# Patient Record
Sex: Male | Born: 1980
Health system: Southern US, Community
[De-identification: ages and names within clinical notes are randomized; demographics above are authoritative.]

## PROBLEM LIST (undated history)

## (undated) DIAGNOSIS — G44209 Tension-type headache, unspecified, not intractable: Secondary | ICD-10-CM

## (undated) DIAGNOSIS — M545 Low back pain, unspecified: Secondary | ICD-10-CM

## (undated) DIAGNOSIS — F419 Anxiety disorder, unspecified: Secondary | ICD-10-CM

## (undated) DIAGNOSIS — E785 Hyperlipidemia, unspecified: Secondary | ICD-10-CM

## (undated) DIAGNOSIS — G4700 Insomnia, unspecified: Secondary | ICD-10-CM

## (undated) DIAGNOSIS — E559 Vitamin D deficiency, unspecified: Secondary | ICD-10-CM

## (undated) HISTORY — DX: Tension-type headache, unspecified, not intractable: G44.209

## (undated) HISTORY — DX: Hyperlipidemia, unspecified: E78.5

## (undated) HISTORY — DX: Anxiety disorder, unspecified: F41.9

## (undated) HISTORY — DX: Low back pain: M54.5

## (undated) HISTORY — DX: Low back pain, unspecified: M54.50

## (undated) HISTORY — DX: Insomnia, unspecified: G47.00

## (undated) HISTORY — DX: Vitamin D deficiency, unspecified: E55.9

---

## 2000-01-07 ENCOUNTER — Encounter: Payer: Self-pay | Admitting: Emergency Medicine

## 2000-01-07 ENCOUNTER — Emergency Department (HOSPITAL_COMMUNITY): Admission: EM | Admit: 2000-01-07 | Discharge: 2000-01-07 | Payer: Self-pay | Admitting: Emergency Medicine

## 2008-03-08 ENCOUNTER — Encounter: Admission: RE | Admit: 2008-03-08 | Discharge: 2008-03-08 | Payer: Self-pay | Admitting: Family Medicine

## 2011-01-23 ENCOUNTER — Other Ambulatory Visit (HOSPITAL_COMMUNITY): Payer: Self-pay | Admitting: Internal Medicine

## 2011-01-23 ENCOUNTER — Ambulatory Visit (HOSPITAL_COMMUNITY)
Admission: RE | Admit: 2011-01-23 | Discharge: 2011-01-23 | Disposition: A | Discharge status: Home / Self Care | Payer: Commercial Managed Care - PPO | Source: Ambulatory Visit | Attending: Internal Medicine | Admitting: Internal Medicine

## 2011-01-23 DIAGNOSIS — M546 Pain in thoracic spine: Secondary | ICD-10-CM

## 2011-01-23 DIAGNOSIS — F17200 Nicotine dependence, unspecified, uncomplicated: Secondary | ICD-10-CM

## 2011-01-23 DIAGNOSIS — R51 Headache: Secondary | ICD-10-CM

## 2011-01-23 DIAGNOSIS — M542 Cervicalgia: Secondary | ICD-10-CM

## 2012-01-22 ENCOUNTER — Other Ambulatory Visit (HOSPITAL_COMMUNITY): Payer: Self-pay | Admitting: Internal Medicine

## 2012-01-22 ENCOUNTER — Ambulatory Visit (HOSPITAL_COMMUNITY)
Admission: RE | Admit: 2012-01-22 | Discharge: 2012-01-22 | Disposition: A | Discharge status: Home / Self Care | Payer: Commercial Managed Care - PPO | Source: Ambulatory Visit | Attending: Internal Medicine | Admitting: Internal Medicine

## 2012-01-22 DIAGNOSIS — M542 Cervicalgia: Secondary | ICD-10-CM | POA: Insufficient documentation

## 2012-01-22 DIAGNOSIS — M79609 Pain in unspecified limb: Secondary | ICD-10-CM | POA: Insufficient documentation

## 2012-01-22 DIAGNOSIS — R51 Headache: Secondary | ICD-10-CM | POA: Insufficient documentation

## 2013-06-03 ENCOUNTER — Encounter: Payer: Self-pay | Admitting: *Deleted

## 2013-06-07 ENCOUNTER — Institutional Professional Consult (permissible substitution): Payer: Commercial Managed Care - PPO | Admitting: Cardiology

## 2013-06-15 ENCOUNTER — Institutional Professional Consult (permissible substitution): Payer: Commercial Managed Care - PPO | Admitting: Cardiology

## 2013-06-23 ENCOUNTER — Encounter: Payer: Self-pay | Admitting: Cardiology

## 2013-06-23 ENCOUNTER — Ambulatory Visit (INDEPENDENT_AMBULATORY_CARE_PROVIDER_SITE_OTHER): Payer: Commercial Managed Care - PPO | Admitting: Cardiology

## 2013-06-23 VITALS — BP 112/75 | HR 78 | Ht 69.0 in | Wt 149.0 lb

## 2013-06-23 DIAGNOSIS — I10 Essential (primary) hypertension: Secondary | ICD-10-CM | POA: Insufficient documentation

## 2013-06-23 DIAGNOSIS — R002 Palpitations: Secondary | ICD-10-CM

## 2013-06-23 DIAGNOSIS — E785 Hyperlipidemia, unspecified: Secondary | ICD-10-CM

## 2013-06-23 NOTE — Patient Instructions (Addendum)
Your physician has recommended that you wear a 48 hour holter monitor. Holter monitors are medical devices that record the heart's electrical activity. Doctors most often use these monitors to diagnose arrhythmias. Arrhythmias are problems with the speed or rhythm of the heartbeat. The monitor is a small, portable device. You can wear one while you do your normal daily activities. This is usually used to diagnose what is causing palpitations/syncope (passing out).   Your physician has requested that you have an exercise tolerance test. For further information please visit [email or web address] Please also follow instruction sheet, as given.   Your physician recommends that you continue on your current medications as directed. Please refer to the Current Medication list given to you today.   Your physician recommends that you schedule a follow-up appointment in: 3 weeks with Dr. Delton See

## 2013-06-23 NOTE — Progress Notes (Signed)
Patient ID: KYSTON GONCE, male   DOB: [birth year], 32 y.o.   MRN: [id number]    Patient Name: Jason Guerrero Date of Encounter: 06/23/2013  Primary Care Provider:  No primary provider on file. Primary Cardiologist:  Tobias Alexander, H  Patient Profile  Palpitations, hypertension, chest tightness  Problem List   Past Medical History  Diagnosis Date  . HLD (hyperlipidemia)   . Anxiety   . Insomnia   . Lumbago   . Tension headache    History reviewed. No pertinent past surgical history.  Allergies  Allergies  Allergen Reactions  . Sulfa Antibiotics    HPI  A 32 year old male with h/o hyperlipidemia controlled by pravastatin, who is ongoing smoker and comes for concerns of hypertension, tachycardia and palpitation. 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 He has noticed at work that he feels palpitations and some chest tightness at the lower sternum that last for few minutes. He also experiences it at home sometimes on his days off. There is no associated syncope, or SOB.  On two occassions when he checked his BP it was elevated. Once, when he was sick and vomiting it was 160, another time he checked it on the way home from work and it was 146.  He drink few beers after work daily. He is also concerned because his uncle has CAD and underwent CABG x4 at age 22.   Home Medications  Prior to Admission medications   Medication Sig Start Date End Date Taking? Authorizing Provider  alprazolam Prudy Feeler) 2 MG tablet Take 2 mg by mouth at bedtime as needed for sleep.   Yes Historical Provider, MD  amitriptyline (ELAVIL) 50 MG tablet Take 50 mg by mouth 3 (three) times daily as needed for sleep (back spasms).   Yes Historical Provider, MD  atenolol (TENORMIN) 50 MG tablet Take 25 mg by mouth daily.  05/26/13  Yes Historical Provider, MD  Cholecalciferol (VITAMIN D-3) 5000 UNITS TABS Take by mouth daily.   Yes Historical Provider, MD    Magnesium 250 MG TABS Take by mouth daily.   Yes Historical Provider, MD  pravastatin (PRAVACHOL) 40 MG tablet Take 20 mg by mouth daily.    Yes Historical Provider, MD    Family History  Family History  Problem Relation Age of Onset  . Hypertension Mother   . Diabetes Mother   . Heart disease Father   . Diabetes Father   . Colon cancer Maternal Grandfather   . Lung cancer Paternal Grandfather   . Breast cancer Maternal Grandmother   . Ovarian cancer Paternal Grandmother   . Hyperlipidemia Father     Social History  History   Social History  . Marital Status: Single    Spouse Name: N/A    Number of Children: N/A  . Years of Education: N/A   Occupational History  . Not on file.   Social History Main Topics  . Smoking status: Current Every Day Smoker    Types: Cigarettes  . Smokeless tobacco: Not on file  . Alcohol Use: Yes  . Drug Use: Not on file  . Sexual Activity: Not on file   Other Topics Concern  . Not on file   Social History Narrative  . No narrative on file     Review of Systems General:  No chills, fever, night sweats or weight changes.  Cardiovascular:  + chest tightness, dyspnea on exertion, edema, orthopnea, +  palpitations, paroxysmal nocturnal dyspnea. Dermatological: No rash, lesions/masses Respiratory: No cough, dyspnea Urologic: No hematuria, dysuria Abdominal:   No nausea, vomiting, diarrhea, bright red blood per rectum, melena, or hematemesis Neurologic:  No visual changes, wkns, changes in mental status. All other systems reviewed and are otherwise negative except as noted above.  Physical Exam  Blood pressure 112/75, pulse 78, height 5\' 9"  (1.753 m), weight 149 lb (67.586 kg).   General: Pleasant, NAD Psych: Normal affect. Neuro: Alert and oriented X 3. Moves all extremities spontaneously. HEENT: Normal  Neck: Supple without bruits or JVD. Lungs:  Resp regular and unlabored, CTA. Heart: RRR no s3, s4, or murmurs. Abdomen: Soft,  non-tender, non-distended, BS + x 4.  Extremities: No clubbing, cyanosis or edema. DP/PT/Radials 2+ and equal bilaterally.  Accessory Clinical Findings  ECG - Normal SR, no ST changes  Assessment & Plan  32 year old male   1. Palpitations with chest tightness - most probably stress related, the patient takes Xanax for anxiety and states its usually when it wears off. We will order 48 hour Holter. Considering his ongoing smoking, positive family history we will order an exercise treadmill stress test to assess his ECG and symptoms during exertion.   2. Hypertension - he was recently started on Atenolol 25 mg QD, his BP is controlled now. He was asked to check his BP once daily until the next visit. There are recent labs on his record, that are all normal including lytes, kidney function, suspicion for dg like pheochrocytoma is very low. The episodes that he described were associated with high level of stress or during sickness.  3. Hyperlipidemia - LDL, HDL normal, high TAG, the patient admits to drinking the night before. He is on Pravastatin currently. We will repeat lipid profile in 1 year.  4. Smoking cessation counseling provided.  Follow up in 3 weeks.  Tobias Alexander, Rexene Edison, MD 06/23/2013, 11:44 AM

## 2013-07-07 ENCOUNTER — Encounter: Payer: Self-pay | Admitting: *Deleted

## 2013-07-07 ENCOUNTER — Encounter (INDEPENDENT_AMBULATORY_CARE_PROVIDER_SITE_OTHER): Payer: Commercial Managed Care - PPO

## 2013-07-07 DIAGNOSIS — R002 Palpitations: Secondary | ICD-10-CM

## 2013-07-07 NOTE — Progress Notes (Signed)
Patient ID: Jason Guerrero, male   DOB: 03-06-1981, 32 y.o.   MRN: [id number] [facility] 48 Hour Holter Monitor applied to patient.

## 2013-07-16 ENCOUNTER — Other Ambulatory Visit: Payer: Self-pay

## 2013-07-16 ENCOUNTER — Telehealth: Payer: Self-pay | Admitting: Cardiology

## 2013-07-16 ENCOUNTER — Encounter: Payer: Self-pay | Admitting: Cardiology

## 2013-07-16 ENCOUNTER — Telehealth: Payer: Self-pay

## 2013-07-16 MED ORDER — ATENOLOL 50 MG PO TABS: 50.0000 mg | ORAL_TABLET | Freq: Every day | ORAL | Status: DC

## 2013-07-16 NOTE — Telephone Encounter (Signed)
**Note De-Identified Jason Guerrero Obfuscation** Pt is advised to take a whole 50 mg tablet of Atenolol daily and that if the increase in Atenolol makes him dizzy to cut dose back to 25 mg daily, he verbalized understanding.

## 2013-07-16 NOTE — Telephone Encounter (Signed)
New problem    Patient calling back requesting diagnosis of Holter monitor.

## 2013-07-16 NOTE — Telephone Encounter (Signed)
Per Dr Delton See, Pt given 48 hr holter monitor results,and recommendation to increase Atenolol to 50 mg daily. Also pt is advised that if he has dizziness with increased dose of Atenolol to decrease back to 25 mg daily, he verbalized understanding. Per pt request RX sent to [facility] on [address].

## 2013-07-16 NOTE — Progress Notes (Signed)
Patient ID: Jason Guerrero, male   DOB: 04-13-1981, 32 y.o.   MRN: [id number]  Holter monitor was reviewed. There is persistent sinus tachycardia starting at 4 pm till 3 am. That's the time when patient is at work and feels symptomatic with palpitations. We will increase his Atenolol to 50 mg po daily. If not tolerated by BP he was advised to decrease it 25 mg daily. Encouraged to start exercising.

## 2013-07-16 NOTE — Telephone Encounter (Signed)
New Problem  Pt request clarification on dosage instructions for Atenolol.. Please assist

## 2013-07-16 NOTE — Telephone Encounter (Signed)
Pt advised, he verbalized understanding. 

## 2013-08-04 ENCOUNTER — Encounter: Payer: Commercial Managed Care - PPO | Admitting: Physician Assistant

## 2013-08-10 ENCOUNTER — Encounter: Payer: Self-pay | Admitting: Cardiology

## 2013-08-10 ENCOUNTER — Ambulatory Visit (INDEPENDENT_AMBULATORY_CARE_PROVIDER_SITE_OTHER): Payer: Commercial Managed Care - PPO | Admitting: Cardiology

## 2013-08-10 VITALS — BP 112/80 | HR 85 | Ht 69.0 in | Wt 150.0 lb

## 2013-08-10 DIAGNOSIS — R002 Palpitations: Secondary | ICD-10-CM

## 2013-08-10 DIAGNOSIS — E785 Hyperlipidemia, unspecified: Secondary | ICD-10-CM

## 2013-08-10 DIAGNOSIS — I10 Essential (primary) hypertension: Secondary | ICD-10-CM

## 2013-08-10 NOTE — Patient Instructions (Signed)
Your physician recommends that you continue on your current medications as directed. Please refer to the Current Medication list given to you today.  Your physician wants you to follow-up in: 6 months. You will receive a reminder letter in the mail two months in advance. If you don't receive a letter, please call our office to schedule the follow-up appointment.  

## 2013-08-10 NOTE — Progress Notes (Signed)
Patient ID: Jason Guerrero, male   DOB: 1981/03/13, 32 y.o.   MRN: [id number]  Patient Name: Jason Guerrero Date of Encounter: 08/10/2013  Primary Care Provider:  No primary provider on file. Primary Cardiologist:  Tobias Alexander, H  Patient Profile  Palpitations, hypertension, chest tightness  Problem List   Past Medical History  Diagnosis Date  . HLD (hyperlipidemia)   . Anxiety   . Insomnia   . Lumbago   . Tension headache    No past surgical history on file.  Allergies  Allergies  Allergen Reactions  . Sulfa Antibiotics    HPI  A 32 year old male with h/o hyperlipidemia controlled by pravastatin, who is ongoing smoker and comes for concerns of hypertension, tachycardia and palpitation. 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 He has noticed at work that he feels palpitations and some chest tightness at the lower sternum that last for few minutes. He also experiences it at home sometimes on his days off. There is no associated syncope, or SOB.  On two occassions when he checked his BP it was elevated. Once, when he was sick and vomiting it was 160, another time he checked it on the way home from work and it was 146.  He drink few beers after work daily. He is also concerned because his uncle has CAD and underwent CABG x4 at age 64.   Home Medications  Prior to Admission medications   Medication Sig Start Date End Date Taking? Authorizing Provider  alprazolam Prudy Feeler) 2 MG tablet Take 2 mg by mouth at bedtime as needed for sleep.   Yes Historical Provider, MD  amitriptyline (ELAVIL) 50 MG tablet Take 50 mg by mouth 3 (three) times daily as needed for sleep (back spasms).   Yes Historical Provider, MD  atenolol (TENORMIN) 50 MG tablet Take 25 mg by mouth daily.  05/26/13  Yes Historical Provider, MD  Cholecalciferol (VITAMIN D-3) 5000 UNITS TABS Take by mouth daily.   Yes Historical Provider, MD  Magnesium 250 MG  TABS Take by mouth daily.   Yes Historical Provider, MD  pravastatin (PRAVACHOL) 40 MG tablet Take 20 mg by mouth daily.    Yes Historical Provider, MD    Family History  Family History  Problem Relation Age of Onset  . Hypertension Mother   . Diabetes Mother   . Heart disease Father   . Diabetes Father   . Colon cancer Maternal Grandfather   . Lung cancer Paternal Grandfather   . Breast cancer Maternal Grandmother   . Ovarian cancer Paternal Grandmother   . Hyperlipidemia Father     Social History  History   Social History  . Marital Status: Single    Spouse Name: N/A    Number of Children: N/A  . Years of Education: N/A   Occupational History  . Not on file.   Social History Main Topics  . Smoking status: Current Every Day Smoker    Types: Cigarettes  . Smokeless tobacco: Not on file  . Alcohol Use: Yes  . Drug Use: Not on file  . Sexual Activity: Not on file   Other Topics Concern  . Not on file   Social History Narrative  . No narrative on file     Review of Systems General:  No chills, fever, night sweats or weight changes.  Cardiovascular:  + chest tightness, dyspnea on exertion, edema, orthopnea, + palpitations, paroxysmal  nocturnal dyspnea. Dermatological: No rash, lesions/masses Respiratory: No cough, dyspnea Urologic: No hematuria, dysuria Abdominal:   No nausea, vomiting, diarrhea, bright red blood per rectum, melena, or hematemesis Neurologic:  No visual changes, wkns, changes in mental status. All other systems reviewed and are otherwise negative except as noted above.  Physical Exam  BP 112/80, HR 85  General: Pleasant, NAD Psych: Normal affect. Neuro: Alert and oriented X 3. Moves all extremities spontaneously. HEENT: Normal  Neck: Supple without bruits or JVD. Lungs:  Resp regular and unlabored, CTA. Heart: RRR no s3, s4, or murmurs. Abdomen: Soft, non-tender, non-distended, BS + x 4.  Extremities: No clubbing, cyanosis or edema.  DP/PT/Radials 2+ and equal bilaterally.  Accessory Clinical Findings  ECG - Normal SR, no ST changes    Assessment & Plan  32 year old male   1. Palpitations with chest tightness - stress related, the patient takes Xanax for anxiety and states its usually when it wears off. 48 hour Holter showed Holter monitor showed persistent sinus tachycardia starting at 4 pm till 3 am, exactly when the patient is at work. No other arrhythmias. We increased Atenolol to 50 mg po daily 2 weeks ago. He still reports palpitations.  An exercise treadmill stress test to assess his ECG and symptoms during exertion as well as BP response during exercise is scheduled for the next week (the patient rescheduled).   2. Hypertension - on Atenolol 50 mg QD, his BP is controlled now.   3. Hyperlipidemia - LDL, HDL normal, high TAG, the patient admits to drinking the night before. He is on Pravastatin currently. We will repeat lipid profile in 1 year.  4. Smoking cessation counseling provided.  The patient was advised not to use Xanax and alcohol at the same time and also about dependence after a chronic Xanax intake. He states that he is unable to sleep without anything else.  Follow up in 1 year. We will call the patient with rthe results of the stress test, and see him earlier if necessary.  Tobias Alexander, Rexene Edison, MD 08/10/2013, 1:57 PM

## 2013-08-11 ENCOUNTER — Other Ambulatory Visit: Payer: Self-pay | Admitting: Internal Medicine

## 2013-08-11 ENCOUNTER — Other Ambulatory Visit: Payer: Self-pay | Admitting: Emergency Medicine

## 2013-08-11 MED ORDER — ALPRAZOLAM 2 MG PO TABS: 2.0000 mg | ORAL_TABLET | Freq: Every evening | ORAL | Status: DC | PRN

## 2013-09-08 ENCOUNTER — Encounter: Payer: Commercial Managed Care - PPO | Admitting: Physician Assistant

## 2013-09-09 ENCOUNTER — Other Ambulatory Visit: Payer: Self-pay | Admitting: Emergency Medicine

## 2013-09-09 MED ORDER — ALPRAZOLAM 2 MG PO TABS: 2.0000 mg | ORAL_TABLET | Freq: Every evening | ORAL | Status: DC | PRN

## 2013-09-20 ENCOUNTER — Ambulatory Visit (INDEPENDENT_AMBULATORY_CARE_PROVIDER_SITE_OTHER): Payer: Commercial Managed Care - PPO | Admitting: Physician Assistant

## 2013-09-20 ENCOUNTER — Encounter: Payer: Commercial Managed Care - PPO | Admitting: Physician Assistant

## 2013-09-20 DIAGNOSIS — R0789 Other chest pain: Secondary | ICD-10-CM

## 2013-09-20 DIAGNOSIS — R002 Palpitations: Secondary | ICD-10-CM

## 2013-09-20 NOTE — Progress Notes (Signed)
Exercise Treadmill Test  Pre-Exercise Testing Evaluation Rhythm: normal sinus  Rate: 87     Test  Exercise Tolerance Test Ordering MD: Tobias Alexander  Interpreting MD: Tereso Newcomer, PA-C  Unique Test No: 1  Treadmill:  1  Indication for ETT: chest tightness, palpitations  Contraindication to ETT: No   Stress Modality: exercise - treadmill  Cardiac Imaging Performed: non   Protocol: standard Bruce - maximal  Max BP:  149/81  Max MPHR (bpm):  188 85% MPR (bpm):  160  MPHR obtained (bpm):  166 % MPHR obtained:  88  Reached 85% MPHR (min:sec):  14:00 Total Exercise Time (min-sec):  14:30  Workload in METS:  17.2 Borg Scale: 18  Reason ETT Terminated:  desired heart rate attained    ST Segment Analysis At Rest: normal ST segments - no evidence of significant ST depression With Exercise: no evidence of significant ST depression  Other Information Arrhythmia:  No Angina during ETT:  present (1) Quality of ETT:  diagnostic  ETT Interpretation:  normal - no evidence of ischemia by ST analysis  Comments: Excellent exercise capacity. Patient did complain of chest pain (tightness) throughout. Normal BP response to exercise. No ST changes to suggest ischemia.  No atrial or ventricular arrhythmias.  Recommendations: F/u with Dr. Tobias Alexander as directed. Signed,  Tereso Newcomer, PA-C   09/20/2013 3:05 PM

## 2013-10-07 ENCOUNTER — Other Ambulatory Visit: Payer: Self-pay | Admitting: Emergency Medicine

## 2013-10-08 ENCOUNTER — Other Ambulatory Visit: Payer: Self-pay | Admitting: Emergency Medicine

## 2013-10-08 MED ORDER — ALPRAZOLAM 2 MG PO TABS: 2.0000 mg | ORAL_TABLET | Freq: Every evening | ORAL | Status: DC | PRN

## 2013-10-11 ENCOUNTER — Encounter: Payer: Self-pay | Admitting: Emergency Medicine

## 2013-10-11 DIAGNOSIS — E559 Vitamin D deficiency, unspecified: Secondary | ICD-10-CM | POA: Insufficient documentation

## 2013-10-15 ENCOUNTER — Ambulatory Visit: Payer: Self-pay | Admitting: Emergency Medicine

## 2013-10-21 ENCOUNTER — Ambulatory Visit (INDEPENDENT_AMBULATORY_CARE_PROVIDER_SITE_OTHER): Payer: Commercial Managed Care - PPO | Admitting: Emergency Medicine

## 2013-10-21 ENCOUNTER — Encounter: Payer: Self-pay | Admitting: Emergency Medicine

## 2013-10-21 VITALS — BP 144/90 | HR 100 | Temp 98.6°F | Resp 18 | Ht 69.25 in | Wt 148.0 lb

## 2013-10-21 DIAGNOSIS — E782 Mixed hyperlipidemia: Secondary | ICD-10-CM

## 2013-10-21 DIAGNOSIS — Z23 Encounter for immunization: Secondary | ICD-10-CM

## 2013-10-21 DIAGNOSIS — S61218A Laceration without foreign body of other finger without damage to nail, initial encounter: Secondary | ICD-10-CM

## 2013-10-21 DIAGNOSIS — G4700 Insomnia, unspecified: Secondary | ICD-10-CM

## 2013-10-21 DIAGNOSIS — S61209A Unspecified open wound of unspecified finger without damage to nail, initial encounter: Secondary | ICD-10-CM

## 2013-10-21 DIAGNOSIS — I10 Essential (primary) hypertension: Secondary | ICD-10-CM

## 2013-10-21 LAB — CBC WITH DIFFERENTIAL/PLATELET
Basophils Absolute: 0 10*3/uL (ref 0.0–0.1)
Basophils Relative: 1 % (ref 0–1)
EOS ABS: 0.3 10*3/uL (ref 0.0–0.7)
EOS PCT: 6 % — AB (ref 0–5)
HCT: 45.2 % (ref 39.0–52.0)
HEMOGLOBIN: 15.9 g/dL (ref 13.0–17.0)
LYMPHS ABS: 1.8 10*3/uL (ref 0.7–4.0)
LYMPHS PCT: 34 % (ref 12–46)
MCH: 31.1 pg (ref 26.0–34.0)
MCHC: 35.2 g/dL (ref 30.0–36.0)
MCV: 88.3 fL (ref 78.0–100.0)
MONOS PCT: 9 % (ref 3–12)
Monocytes Absolute: 0.5 10*3/uL (ref 0.1–1.0)
Neutro Abs: 2.8 10*3/uL (ref 1.7–7.7)
Neutrophils Relative %: 50 % (ref 43–77)
Platelets: 185 10*3/uL (ref 150–400)
RBC: 5.12 MIL/uL (ref 4.22–5.81)
RDW: 13.5 % (ref 11.5–15.5)
WBC: 5.4 10*3/uL (ref 4.0–10.5)

## 2013-10-21 LAB — HEPATIC FUNCTION PANEL
ALBUMIN: 4.7 g/dL (ref 3.5–5.2)
ALT: 16 U/L (ref 0–53)
AST: 18 U/L (ref 0–37)
Alkaline Phosphatase: 53 U/L (ref 39–117)
BILIRUBIN TOTAL: 0.7 mg/dL (ref 0.3–1.2)
Bilirubin, Direct: 0.1 mg/dL (ref 0.0–0.3)
Indirect Bilirubin: 0.6 mg/dL (ref 0.0–0.9)
Total Protein: 7.3 g/dL (ref 6.0–8.3)

## 2013-10-21 LAB — BASIC METABOLIC PANEL WITH GFR
BUN: 14 mg/dL (ref 6–23)
CALCIUM: 9.6 mg/dL (ref 8.4–10.5)
CO2: 27 mEq/L (ref 19–32)
Chloride: 104 mEq/L (ref 96–112)
Creat: 0.94 mg/dL (ref 0.50–1.35)
GFR, Est African American: >89 mL/min
GFR, Est Non African American: >89 mL/min
GLUCOSE: 93 mg/dL (ref 70–99)
Potassium: 4 mEq/L (ref 3.5–5.3)
SODIUM: 139 meq/L (ref 135–145)

## 2013-10-21 LAB — LIPID PANEL
CHOLESTEROL: 148 mg/dL (ref 0–200)
HDL: 53 mg/dL (ref >39)
LDL Cholesterol: 54 mg/dL (ref 0–99)
Total CHOL/HDL Ratio: 2.8 Ratio
Triglycerides: 204 mg/dL — ABNORMAL HIGH (ref <150)
VLDL: 41 mg/dL — ABNORMAL HIGH (ref 0–40)

## 2013-10-21 MED ORDER — ALPRAZOLAM 2 MG PO TABS: 2.0000 mg | ORAL_TABLET | Freq: Every evening | ORAL | Status: DC | PRN

## 2013-10-21 MED ORDER — LEVOFLOXACIN 500 MG PO TABS: 500.0000 mg | ORAL_TABLET | Freq: Every day | ORAL | Status: AC

## 2013-10-21 NOTE — Patient Instructions (Signed)
Tetanus, Diphtheria, Pertussis (Tdap) Vaccine What You Need to Know WHY GET VACCINATED? Tetanus, diphtheria and pertussis can be very serious diseases, even for adolescents and adults. Tdap vaccine can protect us from these diseases. TETANUS (Lockjaw) causes painful muscle tightening and stiffness, usually all over the body.  It can lead to tightening of muscles in the head and neck so you can't open your mouth, swallow, or sometimes even breathe. Tetanus kills about 1 out of 5 people who are infected. DIPHTHERIA can cause a thick coating to form in the back of the throat.  It can lead to breathing problems, paralysis, heart failure, and death. PERTUSSIS (Whooping Cough) causes severe coughing spells, which can cause difficulty breathing, vomiting and disturbed sleep.  It can also lead to weight loss, incontinence, and rib fractures. Up to 2 in 100 adolescents and 5 in 100 adults with pertussis are hospitalized or have complications, which could include pneumonia and death. These diseases are caused by bacteria. Diphtheria and pertussis are spread from person to person through coughing or sneezing. Tetanus enters the body through cuts, scratches, or wounds. Before vaccines, the [place] saw as many as 200,000 cases a year of diphtheria and pertussis, and hundreds of cases of tetanus. Since vaccination began, tetanus and diphtheria have dropped by about 99% and pertussis by about 80%. TDAP VACCINE Tdap vaccine can protect adolescents and adults from tetanus, diphtheria, and pertussis. One dose of Tdap is routinely given at age 11 or 12. People who did not get Tdap at that age should get it as soon as possible. Tdap is especially important for health care professionals and anyone having close contact with a baby younger than 12 months. Pregnant women should get a dose of Tdap during every pregnancy, to protect the newborn from pertussis. Infants are most at risk for severe, life-threatening  complications from pertussis. A similar vaccine, called Td, protects from tetanus and diphtheria, but not pertussis. A Td booster should be given every 10 years. Tdap may be given as one of these boosters if you have not already gotten a dose. Tdap may also be given after a severe cut or burn to prevent tetanus infection. Your doctor can give you more information. Tdap may safely be given at the same time as other vaccines. SOME PEOPLE SHOULD NOT GET THIS VACCINE  If you ever had a life-threatening allergic reaction after a dose of any tetanus, diphtheria, or pertussis containing vaccine, OR if you have a severe allergy to any part of this vaccine, you should not get Tdap. Tell your doctor if you have any severe allergies.  If you had a coma, or long or multiple seizures within 7 days after a childhood dose of DTP or DTaP, you should not get Tdap, unless a cause other than the vaccine was found. You can still get Td.  Talk to your doctor if you:  have epilepsy or another nervous system problem,  had severe pain or swelling after any vaccine containing diphtheria, tetanus or pertussis,  ever had Guillain-Barr Syndrome (GBS),  aren't feeling well on the day the shot is scheduled. RISKS OF A VACCINE REACTION With any medicine, including vaccines, there is a chance of side effects. These are usually mild and go away on their own, but serious reactions are also possible. Brief fainting spells can follow a vaccination, leading to injuries from falling. Sitting or lying down for about 15 minutes can help prevent these. Tell your doctor if you feel dizzy or light-headed, or   have vision changes or ringing in the ears. Mild problems following Tdap (Did not interfere with activities)  Pain where the shot was given (about 3 in 4 adolescents or 2 in 3 adults)  Redness or swelling where the shot was given (about 1 person in 5)  Mild fever of at least 100.9F (up to about 1 in 25 adolescents or 1 in  100 adults)  Headache (about 3 or 4 people in 10)  Tiredness (about 1 person in 3 or 4)  Nausea, vomiting, diarrhea, stomach ache (up to 1 in 4 adolescents or 1 in 10 adults)  Chills, body aches, sore joints, rash, swollen glands (uncommon) Moderate problems following Tdap (Interfered with activities, but did not require medical attention)  Pain where the shot was given (about 1 in 5 adolescents or 1 in 100 adults)  Redness or swelling where the shot was given (up to about 1 in 16 adolescents or 1 in 25 adults)  Fever over 102F (about 1 in 100 adolescents or 1 in 250 adults)  Headache (about 3 in 20 adolescents or 1 in 10 adults)  Nausea, vomiting, diarrhea, stomach ache (up to 1 or 3 people in 100)  Swelling of the entire arm where the shot was given (up to about 3 in 100). Severe problems following Tdap (Unable to perform usual activities, required medical attention)  Swelling, severe pain, bleeding and redness in the arm where the shot was given (rare). A severe allergic reaction could occur after any vaccine (estimated less than 1 in a million doses). WHAT IF THERE IS A SERIOUS REACTION? What should I look for?  Look for anything that concerns you, such as signs of a severe allergic reaction, very high fever, or behavior changes. Signs of a severe allergic reaction can include hives, swelling of the face and throat, difficulty breathing, a fast heartbeat, dizziness, and weakness. These would start a few minutes to a few hours after the vaccination. What should I do?  If you think it is a severe allergic reaction or other emergency that can't wait, call 9-1-1 or get the person to the nearest hospital. Otherwise, call your doctor.  Afterward, the reaction should be reported to the "Vaccine Adverse Event Reporting System" (VAERS). Your doctor might file this report, or you can do it yourself through the VAERS web site at www.vaers.LAgents.no, or by calling 1-913-553-5628. VAERS is  only for reporting reactions. They do not give medical advice.  THE NATIONAL VACCINE INJURY COMPENSATION PROGRAM The National Vaccine Injury Compensation Program (VICP) is a federal program that was created to compensate people who may have been injured by certain vaccines. Persons who believe they may have been injured by a vaccine can learn about the program and about filing a claim by calling 1-516-370-4507 or visiting the VICP website at SpiritualWord.at. HOW CAN I LEARN MORE?  Ask your doctor.  Call your local or state health department.  Contact the Centers for Disease Control and Prevention (CDC):  Call 801-694-1607 or visit CDC's website at PicCapture.uy. CDC Tdap Vaccine VIS (02/06/12) Document Released: 03/17/2012 Document Revised: 01/11/2013 Document Reviewed: 01/06/2013 [facility] Patient Information 2014 [place], [place]. Laceration Care, Adult A laceration is a cut that goes through all layers of the skin. The cut goes into the tissue beneath the skin. HOME CARE For stitches (sutures) or staples:  Keep the cut clean and dry.  If you have a bandage (dressing), change it at least once a day. Change the bandage if it gets wet or  dirty, or as told by your doctor.  Wash the cut with soap and water 2 times a day. Rinse the cut with water. Pat it dry with a clean towel.  Put a thin layer of medicated cream on the cut as told by your doctor.  You may shower after the first 24 hours. Do not soak the cut in water until the stitches are removed.  Only take medicines as told by your doctor.  Have your stitches or staples removed as told by your doctor. For skin adhesive strips:  Keep the cut clean and dry.  Do not get the strips wet. You may take a bath, but be careful to keep the cut dry.  If the cut gets wet, pat it dry with a clean towel.  The strips will fall off on their own. Do not remove the strips that are still stuck to the cut. For wound  glue:  You may shower or take baths. Do not soak or scrub the cut. Do not swim. Avoid heavy sweating until the glue falls off on its own. After a shower or bath, pat the cut dry with a clean towel.  Do not put medicine on your cut until the glue falls off.  If you have a bandage, do not put tape over the glue.  Avoid lots of sunlight or tanning lamps until the glue falls off. Put sunscreen on the cut for the first year to reduce your scar.  The glue will fall off on its own. Do not pick at the glue. You may need a tetanus shot if:  You cannot remember when you had your last tetanus shot.  You have never had a tetanus shot. If you need a tetanus shot and you choose not to have one, you may get tetanus. Sickness from tetanus can be serious. GET HELP RIGHT AWAY IF:   Your pain does not get better with medicine.  Your arm, hand, leg, or foot loses feeling (numbness) or changes color.  Your cut is bleeding.  Your joint feels weak, or you cannot use your joint.  You have painful lumps on your body.  Your cut is red, puffy (swollen), or painful.  You have a red line on the skin near the cut.  You have yellowish-white fluid (pus) coming from the cut.  You have a fever.  You have a bad smell coming from the cut or bandage.  Your cut breaks open before or after stitches are removed.  You notice something coming out of the cut, such as wood or glass.  You cannot move a finger or toe. MAKE SURE YOU:   Understand these instructions.  Will watch your condition.  Will get help right away if you are not doing well or get worse. Document Released: 03/04/2008 Document Revised: 12/09/2011 Document Reviewed: 03/12/2011 [facility] Patient Information 2014 [place], [place].

## 2013-10-21 NOTE — Progress Notes (Signed)
Subjective:    Patient ID: Jason Guerrero, male    DOB: Oct 01, 1980, 33 y.o.   MRN: [id number]  HPI Comments: 33 yo male presents for 3 month F/U for HTN, Cholesterol, D. deficient LAST LABS T 160 TG 307 L 38 H 61  MAG 2.1 INSULIN 6 D 83 HE notes BP has been good. He did not get into bed until 430 am so he is a little tired because he has not slept. He notes back/ arm pain/ BP always worse with lack of sleep.  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 He is not eating as healthy or exercising outside of work.  Cut finger with a knife last night at work, cleaned immediately and put antibacterial on wound. He notes mild decreased sensation at tip of finger.   Hypertension Associated symptoms include anxiety.  Hyperlipidemia  Anxiety      Current Outpatient Prescriptions on File Prior to Visit  Medication Sig Dispense Refill  . alprazolam (XANAX) 2 MG tablet Take 1 tablet (2 mg total) by mouth at bedtime as needed for sleep.  10 tablet  0  . amitriptyline (ELAVIL) 50 MG tablet TAKE 1 TABLET 3 TIMES A DAY FOR CHRONIC NECK PAIN  270 tablet  2  . atenolol (TENORMIN) 50 MG tablet Take 1 tablet (50 mg total) by mouth daily.  30 tablet  6  . Cholecalciferol (VITAMIN D-3) 5000 UNITS TABS Take by mouth daily.      . Magnesium 250 MG TABS Take by mouth daily.      . pravastatin (PRAVACHOL) 40 MG tablet Take 20 mg by mouth daily.        No current facility-administered medications on file prior to visit.      Review of Systems  Musculoskeletal: Positive for arthralgias.  Skin: Positive for wound.  Psychiatric/Behavioral: Positive for sleep disturbance.  All other systems reviewed and are negative.   BP 144/90  Pulse 100  Temp(Src) 98.6 F (37 C) (Temporal)  Resp 18  Ht 5' 9.25" (1.759 m)  Wt 148 lb (67.132 kg)  BMI 21.70 kg/m2     Objective:   Physical Exam  Nursing note and vitals reviewed. Constitutional: He is  oriented to person, place, and time. He appears well-developed and well-nourished.  HENT:  Head: Normocephalic and atraumatic.  Right Ear: External ear normal.  Left Ear: External ear normal.  Nose: Nose normal.  Eyes: Conjunctivae and EOM are normal.  Neck: Normal range of motion. Neck supple. No JVD present. No thyromegaly present.  Cardiovascular: Normal rate, regular rhythm, normal heart sounds and intact distal pulses.   Pulmonary/Chest: Effort normal and breath sounds normal.  Abdominal: Soft. Bowel sounds are normal. He exhibits no distension and no mass. There is no tenderness. There is no rebound and no guarding.  Musculoskeletal: Normal range of motion. He exhibits no edema and no tenderness.       Hands: Lymphadenopathy:    He has no cervical adenopathy.  Neurological: He is alert and oriented to person, place, and time. He has normal reflexes. A cranial nerve deficit is present. Coordination normal.  Decreased tip of right index finger  Skin: Skin is warm and dry.  Psychiatric: He has a normal mood and affect. His behavior is normal. Judgment and thought content normal.          Assessment & Plan:  1.  3 month F/U for  HTN, Cholesterol,  D. Deficient. Needs healthy diet, cardio QD and obtain healthy weight. Check Labs, Check BP if >130/80 call office Increase Atenolol to 100mg  = 2 of the 50 mg 2. Finger laceration- REF Dr Merlyn LotKuzma- Wound cleaned with soak of betadine/ alcohol. Area approximated with steri strip x 1 to decrease movement of wound but allowing for drainage per Ortho. Neosporin/ guaze/ paper tape applied. Wound hygiene explained

## 2013-10-25 NOTE — Progress Notes (Signed)
LMOM TO CALL TO SCHEDULE 3 MTH

## 2013-10-28 ENCOUNTER — Encounter: Payer: Self-pay | Admitting: Emergency Medicine

## 2013-10-29 ENCOUNTER — Encounter: Payer: Commercial Managed Care - PPO | Admitting: Nurse Practitioner

## 2013-11-22 ENCOUNTER — Other Ambulatory Visit: Payer: Self-pay | Admitting: Emergency Medicine

## 2013-11-22 ENCOUNTER — Encounter: Payer: Self-pay | Admitting: Emergency Medicine

## 2013-11-22 MED ORDER — ATENOLOL 100 MG PO TABS
100.0000 mg | ORAL_TABLET | Freq: Every day | ORAL | Status: DC
Start: 2013-11-22 — End: 2014-05-17

## 2013-12-15 ENCOUNTER — Other Ambulatory Visit: Payer: Self-pay | Admitting: Emergency Medicine

## 2013-12-16 ENCOUNTER — Encounter: Payer: Self-pay | Admitting: Emergency Medicine

## 2013-12-16 ENCOUNTER — Other Ambulatory Visit: Payer: Self-pay | Admitting: Emergency Medicine

## 2013-12-17 ENCOUNTER — Other Ambulatory Visit: Payer: Self-pay | Admitting: Emergency Medicine

## 2013-12-22 ENCOUNTER — Other Ambulatory Visit: Payer: Self-pay | Admitting: Emergency Medicine

## 2014-01-20 ENCOUNTER — Ambulatory Visit (INDEPENDENT_AMBULATORY_CARE_PROVIDER_SITE_OTHER): Payer: Commercial Managed Care - PPO | Admitting: Emergency Medicine

## 2014-01-20 ENCOUNTER — Other Ambulatory Visit: Payer: Self-pay | Admitting: Emergency Medicine

## 2014-01-20 ENCOUNTER — Encounter: Payer: Self-pay | Admitting: Emergency Medicine

## 2014-01-20 VITALS — BP 122/84 | HR 88 | Temp 98.2°F | Resp 16 | Ht 69.25 in | Wt 142.0 lb

## 2014-01-20 DIAGNOSIS — E781 Pure hyperglyceridemia: Secondary | ICD-10-CM

## 2014-01-20 DIAGNOSIS — R51 Headache: Secondary | ICD-10-CM

## 2014-01-20 LAB — LIPID PANEL
Cholesterol: 167 mg/dL (ref 0–200)
HDL: 63 mg/dL (ref >39)
LDL Cholesterol: 62 mg/dL (ref 0–99)
Total CHOL/HDL Ratio: 2.7 Ratio
Triglycerides: 210 mg/dL — ABNORMAL HIGH (ref <150)
VLDL: 42 mg/dL — ABNORMAL HIGH (ref 0–40)

## 2014-01-20 LAB — CBC WITH DIFFERENTIAL/PLATELET
Basophils Absolute: 0.1 10*3/uL (ref 0.0–0.1)
Basophils Relative: 1 % (ref 0–1)
EOS ABS: 0.3 10*3/uL (ref 0.0–0.7)
Eosinophils Relative: 5 % (ref 0–5)
HCT: 48.2 % (ref 39.0–52.0)
HEMOGLOBIN: 16.9 g/dL (ref 13.0–17.0)
LYMPHS ABS: 2.1 10*3/uL (ref 0.7–4.0)
LYMPHS PCT: 35 % (ref 12–46)
MCH: 31.4 pg (ref 26.0–34.0)
MCHC: 35.1 g/dL (ref 30.0–36.0)
MCV: 89.6 fL (ref 78.0–100.0)
MONOS PCT: 8 % (ref 3–12)
Monocytes Absolute: 0.5 10*3/uL (ref 0.1–1.0)
NEUTROS ABS: 3.1 10*3/uL (ref 1.7–7.7)
Neutrophils Relative %: 51 % (ref 43–77)
PLATELETS: 196 10*3/uL (ref 150–400)
RBC: 5.38 MIL/uL (ref 4.22–5.81)
RDW: 13.6 % (ref 11.5–15.5)
WBC: 6 10*3/uL (ref 4.0–10.5)

## 2014-01-20 LAB — HEPATIC FUNCTION PANEL
ALBUMIN: 4.7 g/dL (ref 3.5–5.2)
ALK PHOS: 53 U/L (ref 39–117)
ALT: 14 U/L (ref 0–53)
AST: 18 U/L (ref 0–37)
BILIRUBIN INDIRECT: 0.6 mg/dL (ref 0.2–1.2)
BILIRUBIN TOTAL: 0.8 mg/dL (ref 0.2–1.2)
Bilirubin, Direct: 0.2 mg/dL (ref 0.0–0.3)
Total Protein: 7.3 g/dL (ref 6.0–8.3)

## 2014-01-20 MED ORDER — ALPRAZOLAM 2 MG PO TABS: ORAL_TABLET | ORAL | Status: DC

## 2014-01-20 NOTE — Patient Instructions (Signed)
Tension Headache A tension headache is pain, pressure, or aching felt over the front and sides of the head. Tension headaches often come after stress, feeling worried (anxiety), or feeling sad or down for a while (depressed). HOME CARE  Only take medicine as told by your doctor.  Lie down in a dark, quiet room when you have a headache.  Keep a journal to find out if certain things bring on headaches. For example, write down:  What you eat and drink.  How much sleep you get.  Any change to your diet or medicines.  Relax by getting a massage or doing other relaxing activities.  Put ice or heat packs on the head and neck area as told by your doctor.  Lessen stress.  Sit up straight. Do not tighten (tense) your muscles.  Quit smoking if you smoke.  Lessen how much alcohol you drink.  Lessen how much caffeine you drink, or stop drinking caffeine.  Eat and exercise regularly.  Get enough sleep.  Avoid using too much pain medicine. GET HELP RIGHT AWAY IF:   Your headache becomes really bad.  You have a fever.  You have a stiff neck.  You have trouble seeing.  Your muscles are weak, or you lose muscle control.  You lose your balance or have trouble walking.  You feel like you will pass out (faint), or you pass out.  You have really bad symptoms that are different than your first symptoms.  You have problems with the medicines given to you by your doctor.  Your medicines do not work.  Your headache feels different than the other headaches.  You feel sick to your stomach (nauseous) or throw up (vomit). MAKE SURE YOU:   Understand these instructions.  Will watch your condition.  Will get help right away if you are not doing well or get worse. Document Released: 12/11/2009 Document Revised: 12/09/2011 Document Reviewed: 09/06/2011 [facility] Patient Information 2014 [place], [place]. Temporomandibular Problems  Temporomandibular joint (TMJ) dysfunction means  there are problems with the joint between your jaw and your skull. This is a joint lined by cartilage like other joints in your body but also has a small disc in the joint which keeps the bones from rubbing on each other. These joints are like other joints and can get inflamed (sore) from arthritis and other problems. When this joint gets sore, it can cause headaches and pain in the jaw and the face. CAUSES  Usually the arthritic types of problems are caused by soreness in the joint. Soreness in the joint can also be caused by overuse. This may come from grinding your teeth. It may also come from mis-alignment in the joint. DIAGNOSIS Diagnosis of this condition can often be made by history and exam. Sometimes your caregiver may need X-rays or an MRI scan to determine the exact cause. It may be necessary to see your dentist to determine if your teeth and jaws are lined up correctly. TREATMENT  Most of the time this problem is not serious; however, sometimes it can persist (become chronic). When this happens medications that will cut down on inflammation (soreness) help. Sometimes a shot of cortisone into the joint will be helpful. If your teeth are not aligned it may help for your dentist to make a splint for your mouth that can help this problem. If no physical problems can be found, the problem may come from tension. If tension is found to be the cause, biofeedback or relaxation techniques may be  helpful. HOME CARE INSTRUCTIONS   Later in the day, applications of ice packs may be helpful. Ice can be used in a plastic bag with a towel around it to prevent frostbite to skin. This may be used about every 2 hours for 20 to 30 minutes, as needed while awake, or as directed by your caregiver.  Only take over-the-counter or prescription medicines for pain, discomfort, or fever as directed by your caregiver.  If physical therapy was prescribed, follow your caregiver's directions.  Wear mouth appliances as  directed if they were given. Document Released: 06/11/2001 Document Revised: 12/09/2011 Document Reviewed: 09/18/2008 [facility] Patient Information 2014 [place], [place].

## 2014-01-20 NOTE — Progress Notes (Signed)
Subjective:    Patient ID: Jason Guerrero, male    DOB: [birth year], 33 y.o.   MRN: [id number]  HPI Comments: 33 yo WM for cholesterol and anxiety F/U. He added FO at last OV. He is eating healthy. He is still drinking, but not as much ETOH. He is still stressed with work which often causes headaches. He notes he sometimes catches himself clenching his teeth. He has not stopped tobacco yet with the stress at work. Last labs CHOL         148   10/21/2013 HDL           53   10/21/2013 LDLCALC       54   10/21/2013 TRIG         204   10/21/2013 CHOLHDL      2.8   10/21/2013      Medication List       This list is accurate as of: 01/20/14  2:04 PM.  Always use your most recent med list.               alprazolam 2 MG tablet  Commonly known as:  XANAX  TAKE 1 TABLET BY MOUTH AT BEDTIME AS NEEDED FOR SLEEP (EACH DOSE EQUALS 2 MILLIGRAMS)     amitriptyline 50 MG tablet  Commonly known as:  ELAVIL  TAKE 1 TABLET BY MOUTH 3 TIMES A DAY FOR CHRONIC NECK PAIN     atenolol 100 MG tablet  Commonly known as:  TENORMIN  Take 1 tablet (100 mg total) by mouth daily.     Fish Oil 300 MG Caps  Take by mouth daily.     Magnesium 250 MG Tabs  Take by mouth daily.     pravastatin 40 MG tablet  Commonly known as:  PRAVACHOL  TAKE 1 TABLET AT BEDTIME     Vitamin D-3 5000 UNITS Tabs  Take by mouth daily.        Allergies  Allergen Reactions  . Sulfa Antibiotics    Past Medical History  Diagnosis Date  . HLD (hyperlipidemia)   . Anxiety   . Insomnia   . Lumbago   . Tension headache   . Unspecified vitamin D deficiency      Review of Systems  Neurological: Positive for headaches.  Psychiatric/Behavioral: The patient is nervous/anxious.   All other systems reviewed and are negative.  BP 122/84  Pulse 88  Temp(Src) 98.2 F (36.8 C) (Temporal)  Resp 16  Ht 5' 9.25" (1.759 m)  Wt 142 lb (64.411 kg)  BMI 20.82 kg/m2     Objective:   Physical Exam  Nursing note and vitals  reviewed. Constitutional: He is oriented to person, place, and time. He appears well-developed and well-nourished.  HENT:  Head: Normocephalic and atraumatic.  Right Ear: External ear normal.  Left Ear: External ear normal.  Nose: Nose normal.  Eyes: Conjunctivae and EOM are normal.  Neck: Normal range of motion. Neck supple. No JVD present. No thyromegaly present.  Cardiovascular: Normal rate, regular rhythm, normal heart sounds and intact distal pulses.   Pulmonary/Chest: Effort normal and breath sounds normal.  Abdominal: Soft. Bowel sounds are normal. He exhibits no distension and no mass. There is no tenderness. There is no rebound and no guarding.  Musculoskeletal: Normal range of motion. He exhibits no edema and no tenderness.  Lymphadenopathy:    He has no cervical adenopathy.  Neurological: He is alert and oriented to person, place, and time. He  has normal reflexes. No cranial nerve deficit. Coordination normal.  Skin: Skin is warm and dry.  Psychiatric: He has a normal mood and affect. His behavior is normal. Judgment and thought content normal.          Assessment & Plan:  1. TG/Cholesterol- recheck labs, Need to eat healthier and exercise AD.  2. Tobacco Dep- advised cessation techniques and need for d/c to decrease Risk   3. Headache vs TMJ vs stress-Advised may need QD RX Propranolol if continues- Hygiene explained

## 2014-03-24 ENCOUNTER — Other Ambulatory Visit: Payer: Self-pay | Admitting: Emergency Medicine

## 2014-03-25 ENCOUNTER — Other Ambulatory Visit: Payer: Self-pay | Admitting: Emergency Medicine

## 2014-05-17 ENCOUNTER — Other Ambulatory Visit: Payer: Self-pay | Admitting: Emergency Medicine

## 2014-05-26 ENCOUNTER — Encounter: Payer: Self-pay | Admitting: Emergency Medicine

## 2014-06-01 ENCOUNTER — Other Ambulatory Visit: Payer: Self-pay | Admitting: Physician Assistant

## 2014-06-01 ENCOUNTER — Encounter: Payer: Self-pay | Admitting: Internal Medicine

## 2014-06-01 ENCOUNTER — Ambulatory Visit (INDEPENDENT_AMBULATORY_CARE_PROVIDER_SITE_OTHER): Payer: Commercial Managed Care - PPO | Admitting: Internal Medicine

## 2014-06-01 VITALS — BP 104/68 | HR 72 | Temp 98.2°F | Resp 16 | Ht 70.0 in | Wt 147.8 lb

## 2014-06-01 DIAGNOSIS — Z0000 Encounter for general adult medical examination without abnormal findings: Secondary | ICD-10-CM

## 2014-06-01 DIAGNOSIS — R7401 Elevation of levels of liver transaminase levels: Secondary | ICD-10-CM

## 2014-06-01 DIAGNOSIS — E559 Vitamin D deficiency, unspecified: Secondary | ICD-10-CM

## 2014-06-01 DIAGNOSIS — I10 Essential (primary) hypertension: Secondary | ICD-10-CM

## 2014-06-01 DIAGNOSIS — Z79899 Other long term (current) drug therapy: Secondary | ICD-10-CM

## 2014-06-01 DIAGNOSIS — Z113 Encounter for screening for infections with a predominantly sexual mode of transmission: Secondary | ICD-10-CM

## 2014-06-01 DIAGNOSIS — Z111 Encounter for screening for respiratory tuberculosis: Secondary | ICD-10-CM

## 2014-06-01 DIAGNOSIS — E782 Mixed hyperlipidemia: Secondary | ICD-10-CM

## 2014-06-01 DIAGNOSIS — R740 Nonspecific elevation of levels of transaminase and lactic acid dehydrogenase [LDH]: Secondary | ICD-10-CM

## 2014-06-01 DIAGNOSIS — Z1212 Encounter for screening for malignant neoplasm of rectum: Secondary | ICD-10-CM

## 2014-06-01 LAB — HEMOGLOBIN A1C
HEMOGLOBIN A1C: 5.4 % (ref <5.7)
Mean Plasma Glucose: 108 mg/dL (ref <117)

## 2014-06-01 LAB — CBC WITH DIFFERENTIAL/PLATELET
BASOS PCT: 1 % (ref 0–1)
Basophils Absolute: 0.1 10*3/uL (ref 0.0–0.1)
Eosinophils Absolute: 0.4 10*3/uL (ref 0.0–0.7)
Eosinophils Relative: 6 % — ABNORMAL HIGH (ref 0–5)
HCT: 45.3 % (ref 39.0–52.0)
Hemoglobin: 15.7 g/dL (ref 13.0–17.0)
LYMPHS ABS: 2.4 10*3/uL (ref 0.7–4.0)
Lymphocytes Relative: 37 % (ref 12–46)
MCH: 31.5 pg (ref 26.0–34.0)
MCHC: 34.7 g/dL (ref 30.0–36.0)
MCV: 90.8 fL (ref 78.0–100.0)
Monocytes Absolute: 0.5 10*3/uL (ref 0.1–1.0)
Monocytes Relative: 8 % (ref 3–12)
NEUTROS ABS: 3.1 10*3/uL (ref 1.7–7.7)
NEUTROS PCT: 48 % (ref 43–77)
Platelets: 184 10*3/uL (ref 150–400)
RBC: 4.99 MIL/uL (ref 4.22–5.81)
RDW: 13.6 % (ref 11.5–15.5)
WBC: 6.5 10*3/uL (ref 4.0–10.5)

## 2014-06-01 MED ORDER — NADOLOL 80 MG PO TABS: ORAL_TABLET | ORAL | Status: DC

## 2014-06-01 NOTE — Patient Instructions (Signed)
Recommend the book "The END of DIETING" by Dr Baker Janus   and the book "The END of DIABETES " by Dr Excell Seltzer  At [email or web address] - get book & Audio CD's      Being diabetic has a  300% increased risk for heart attack, stroke, cancer, and alzheimer- type vascular dementia. It is very important that you work harder with diet by avoiding all foods that are white except chicken & fish. Avoid white rice (brown & wild rice is OK), white potatoes (sweetpotatoes in moderation is OK), White bread or wheat bread or anything made out of white flour like bagels, donuts, rolls, buns, biscuits, cakes, pastries, cookies, pizza crust, and pasta (made from white flour & egg whites) - vegetarian pasta or spinach or wheat pasta is OK. Multigrain breads like Arnold's or [employer], or multigrain sandwich thins or flatbreads.  Diet, exercise and weight loss can reverse and cure diabetes in the early stages.  Diet, exercise and weight loss is very important in the control and prevention of complications of diabetes which affects every system in your body, ie. Brain - dementia/stroke, eyes - glaucoma/blindness, heart - heart attack/heart failure, kidneys - dialysis, stomach - gastric paralysis, intestines - malabsorption, nerves - severe painful neuritis, circulation - gangrene & loss of a leg(s), and finally cancer and Alzheimers.    I recommend avoid fried & greasy foods,  sweets/candy, white rice (brown or wild rice or Quinoa is OK), white potatoes (sweet potatoes are OK) - anything made from white flour - bagels, doughnuts, rolls, buns, biscuits,white and wheat breads, pizza crust and traditional pasta made of white flour & egg white(vegetarian pasta or spinach or wheat pasta is OK).  Multi-grain bread is OK - like multi-grain flat bread or sandwich thins. Avoid alcohol in excess. Exercise is also important.    Eat all the vegetables you want - avoid meat, especially red meat and dairy - especially cheese.  Cheese  is the most concentrated form of trans-fats which is the worst thing to clog up our arteries. Veggie cheese is OK which can be found in the fresh produce section at [employer] or [employer] or [employer]  Preventive Care for Adults A healthy lifestyle and preventive care can promote health and wellness. Preventive health guidelines for men include the following key practices:  A routine yearly physical is a good way to check with your health care provider about your health and preventative screening. It is a chance to share any concerns and updates on your health and to receive a thorough exam.  Visit your dentist for a routine exam and preventative care every 6 months. Brush your teeth twice a day and floss once a day. Good oral hygiene prevents tooth decay and gum disease.  The frequency of eye exams is based on your age, health, family medical history, use of contact lenses, and other factors. Follow your health care provider's recommendations for frequency of eye exams.  Eat a healthy diet. Foods such as vegetables, fruits, whole grains, low-fat dairy products, and lean protein foods contain the nutrients you need without too many calories. Decrease your intake of foods high in solid fats, added sugars, and salt. Eat the right amount of calories for you.Get information about a proper diet from your health care provider, if necessary.  Regular physical exercise is one of the most important things you can do for your health. Most adults should get at least 150 minutes of moderate-intensity exercise (any activity that  increases your heart rate and causes you to sweat) each week. In addition, most adults need muscle-strengthening exercises on 2 or more days a week.  Maintain a healthy weight. The body mass index (BMI) is a screening tool to identify possible weight problems. It provides an estimate of body fat based on height and weight. Your health care provider can find your BMI and can help you  achieve or maintain a healthy weight.For adults 20 years and older:  A BMI below 18.5 is considered underweight.  A BMI of 18.5 to 24.9 is normal.  A BMI of 25 to 29.9 is considered overweight.  A BMI of 30 and above is considered obese.  Maintain normal blood lipids and cholesterol levels by exercising and minimizing your intake of saturated fat. Eat a balanced diet with plenty of fruit and vegetables. Blood tests for lipids and cholesterol should begin at age 20 and be repeated every 5 years. If your lipid or cholesterol levels are high, you are over 50, or you are at high risk for heart disease, you may need your cholesterol levels checked more frequently.Ongoing high lipid and cholesterol levels should be treated with medicines if diet and exercise are not working.  If you smoke, find out from your health care provider how to quit. If you do not use tobacco, do not start.  Lung cancer screening is recommended for adults aged 72-80 years who are at high risk for developing lung cancer because of a history of smoking. A yearly low-dose CT scan of the lungs is recommended for people who have at least a 30-pack-year history of smoking and are a current smoker or have quit within the past 15 years. A pack year of smoking is smoking an average of 1 pack of cigarettes a day for 1 year (for example: 1 pack a day for 30 years or 2 packs a day for 15 years). Yearly screening should continue until the smoker has stopped smoking for at least 15 years. Yearly screening should be stopped for people who develop a health problem that would prevent them from having lung cancer treatment.  If you choose to drink alcohol, do not have more than 2 drinks per day. One drink is considered to be 12 ounces (355 mL) of beer, 5 ounces (148 mL) of wine, or 1.5 ounces (44 mL) of liquor.  Avoid use of street drugs. Do not share needles with anyone. Ask for help if you need support or instructions about stopping the use of  drugs.  High blood pressure causes heart disease and increases the risk of stroke. Your blood pressure should be checked at least every 1-2 years. Ongoing high blood pressure should be treated with medicines, if weight loss and exercise are not effective.  If you are 28-64 years old, ask your health care provider if you should take aspirin to prevent heart disease.  Diabetes screening involves taking a blood sample to check your fasting blood sugar level. This should be done once every 3 years, after age 13, if you are within normal weight and without risk factors for diabetes. Testing should be considered at a younger age or be carried out more frequently if you are overweight and have at least 1 risk factor for diabetes.  Colorectal cancer can be detected and often prevented. Most routine colorectal cancer screening begins at the age of 78 and continues through age 56. However, your health care provider may recommend screening at an earlier age if you have risk  factors for colon cancer. On a yearly basis, your health care provider may provide home test kits to check for hidden blood in the stool. Use of a small camera at the end of a tube to directly examine the colon (sigmoidoscopy or colonoscopy) can detect the earliest forms of colorectal cancer. Talk to your health care provider about this at age 48, when routine screening begins. Direct exam of the colon should be repeated every 5-10 years through age 60, unless early forms of precancerous polyps or small growths are found.  People who are at an increased risk for hepatitis B should be screened for this virus. You are considered at high risk for hepatitis B if:  You were born in a country where hepatitis B occurs often. Talk with your health care provider about which countries are considered high risk.  Your parents were born in a high-risk country and you have not received a shot to protect against hepatitis B (hepatitis B vaccine).  You have  HIV or AIDS.  You use needles to inject street drugs.  You live with, or have sex with, someone who has hepatitis B.  You are a man who has sex with other men (MSM).  You get hemodialysis treatment.  You take certain medicines for conditions such as cancer, organ transplantation, and autoimmune conditions.  Hepatitis C blood testing is recommended for all people born from [birth year] through 1965 and any individual with known risks for hepatitis C.  Practice safe sex. Use condoms and avoid high-risk sexual practices to reduce the spread of sexually transmitted infections (STIs). STIs include gonorrhea, chlamydia, syphilis, trichomonas, herpes, HPV, and human immunodeficiency virus (HIV). Herpes, HIV, and HPV are viral illnesses that have no cure. They can result in disability, cancer, and death.  If you are at risk of being infected with HIV, it is recommended that you take a prescription medicine daily to prevent HIV infection. This is called preexposure prophylaxis (PrEP). You are considered at risk if:  You are a man who has sex with other men (MSM) and have other risk factors.  You are a heterosexual man, are sexually active, and are at increased risk for HIV infection.  You take drugs by injection.  You are sexually active with a partner who has HIV.  Talk with your health care provider about whether you are at high risk of being infected with HIV. If you choose to begin PrEP, you should first be tested for HIV. You should then be tested every 3 months for as long as you are taking PrEP.  A one-time screening for abdominal aortic aneurysm (AAA) and surgical repair of large AAAs by ultrasound are recommended for men ages 51 to 11 years who are current or former smokers.  Healthy men should no longer receive prostate-specific antigen (PSA) blood tests as part of routine cancer screening. Talk with your health care provider about prostate cancer screening.  Testicular cancer screening is  not recommended for adult males who have no symptoms. Screening includes self-exam, a health care provider exam, and other screening tests. Consult with your health care provider about any symptoms you have or any concerns you have about testicular cancer.  Use sunscreen. Apply sunscreen liberally and repeatedly throughout the day. You should seek shade when your shadow is shorter than you. Protect yourself by wearing long sleeves, pants, a wide-brimmed hat, and sunglasses year round, whenever you are outdoors.  Once a month, do a whole-body skin exam, using a mirror to look  at the skin on your back. Tell your health care provider about new moles, moles that have irregular borders, moles that are larger than a pencil eraser, or moles that have changed in shape or color.  Stay current with required vaccines (immunizations).  Influenza vaccine. All adults should be immunized every year.  Tetanus, diphtheria, and acellular pertussis (Td, Tdap) vaccine. An adult who has not previously received Tdap or who does not know his vaccine status should receive 1 dose of Tdap. This initial dose should be followed by tetanus and diphtheria toxoids (Td) booster doses every 10 years. Adults with an unknown or incomplete history of completing a 3-dose immunization series with Td-containing vaccines should begin or complete a primary immunization series including a Tdap dose. Adults should receive a Td booster every 10 years.  Varicella vaccine. An adult without evidence of immunity to varicella should receive 2 doses or a second dose if he has previously received 1 dose.  Human papillomavirus (HPV) vaccine. Males aged 29-21 years who have not received the vaccine previously should receive the 3-dose series. Males aged 22-26 years may be immunized. Immunization is recommended through the age of 26 years for any male who has sex with males and did not get any or all doses earlier. Immunization is recommended for any  person with an immunocompromised condition through the age of 29 years if he did not get any or all doses earlier. During the 3-dose series, the second dose should be obtained 4-8 weeks after the first dose. The third dose should be obtained 24 weeks after the first dose and 16 weeks after the second dose.  Zoster vaccine. One dose is recommended for adults aged 38 years or older unless certain conditions are present.  Measles, mumps, and rubella (MMR) vaccine. Adults born before [birth year] generally are considered immune to measles and mumps. Adults born in [birth year] or later should have 1 or more doses of MMR vaccine unless there is a contraindication to the vaccine or there is laboratory evidence of immunity to each of the three diseases. A routine second dose of MMR vaccine should be obtained at least 28 days after the first dose for students attending postsecondary schools, health care workers, or international travelers. People who received inactivated measles vaccine or an unknown type of measles vaccine during 1963-1967 should receive 2 doses of MMR vaccine. People who received inactivated mumps vaccine or an unknown type of mumps vaccine before 1979 and are at high risk for mumps infection should consider immunization with 2 doses of MMR vaccine. Unvaccinated health care workers born before [birth year] who lack laboratory evidence of measles, mumps, or rubella immunity or laboratory confirmation of disease should consider measles and mumps immunization with 2 doses of MMR vaccine or rubella immunization with 1 dose of MMR vaccine.  Pneumococcal 13-valent conjugate (PCV13) vaccine. When indicated, a person who is uncertain of his immunization history and has no record of immunization should receive the PCV13 vaccine. An adult aged 68 years or older who has certain medical conditions and has not been previously immunized should receive 1 dose of PCV13 vaccine. This PCV13 should be followed with a dose of pneumococcal  polysaccharide (PPSV23) vaccine. The PPSV23 vaccine dose should be obtained at least 8 weeks after the dose of PCV13 vaccine. An adult aged 95 years or older who has certain medical conditions and previously received 1 or more doses of PPSV23 vaccine should receive 1 dose of PCV13. The PCV13 vaccine dose should be obtained 1  or more years after the last PPSV23 vaccine dose.  Pneumococcal polysaccharide (PPSV23) vaccine. When PCV13 is also indicated, PCV13 should be obtained first. All adults aged 70 years and older should be immunized. An adult younger than age 73 years who has certain medical conditions should be immunized. Any person who resides in a nursing home or long-term care facility should be immunized. An adult smoker should be immunized. People with an immunocompromised condition and certain other conditions should receive both PCV13 and PPSV23 vaccines. People with human immunodeficiency virus (HIV) infection should be immunized as soon as possible after diagnosis. Immunization during chemotherapy or radiation therapy should be avoided. Routine use of PPSV23 vaccine is not recommended for American Indians, [place] Natives, or people younger than 65 years unless there are medical conditions that require PPSV23 vaccine. When indicated, people who have unknown immunization and have no record of immunization should receive PPSV23 vaccine. One-time revaccination 5 years after the first dose of PPSV23 is recommended for people aged 19-64 years who have chronic kidney failure, nephrotic syndrome, asplenia, or immunocompromised conditions. People who received 1-2 doses of PPSV23 before age 44 years should receive another dose of PPSV23 vaccine at age 61 years or later if at least 5 years have passed since the previous dose. Doses of PPSV23 are not needed for people immunized with PPSV23 at or after age 30 years.  Meningococcal vaccine. Adults with asplenia or persistent complement component deficiencies  should receive 2 doses of quadrivalent meningococcal conjugate (MenACWY-D) vaccine. The doses should be obtained at least 2 months apart. Microbiologists working with certain meningococcal bacteria, [employer] recruits, people at risk during an outbreak, and people who travel to or live in countries with a high rate of meningitis should be immunized. A first-year college student up through age 56 years who is living in a residence hall should receive a dose if he did not receive a dose on or after his 16th birthday. Adults who have certain high-risk conditions should receive one or more doses of vaccine.  Hepatitis A vaccine. Adults who wish to be protected from this disease, have certain high-risk conditions, work with hepatitis A-infected animals, work in hepatitis A research labs, or travel to or work in countries with a high rate of hepatitis A should be immunized. Adults who were previously unvaccinated and who anticipate close contact with an international adoptee during the first 60 days after arrival in the [place] from a country with a high rate of hepatitis A should be immunized.  Hepatitis B vaccine. Adults should be immunized if they wish to be protected from this disease, have certain high-risk conditions, may be exposed to blood or other infectious body fluids, are household contacts or sex partners of hepatitis B positive people, are clients or workers in certain care facilities, or travel to or work in countries with a high rate of hepatitis B.  Haemophilus influenzae type b (Hib) vaccine. A previously unvaccinated person with asplenia or sickle cell disease or having a scheduled splenectomy should receive 1 dose of Hib vaccine. Regardless of previous immunization, a recipient of a hematopoietic stem cell transplant should receive a 3-dose series 6-12 months after his successful transplant. Hib vaccine is not recommended for adults with HIV infection. Preventive Service / Frequency Ages  67 to 25  Blood pressure check.** / Every 1 to 2 years.  Lipid and cholesterol check.** / Every 5 years beginning at age 52.  Hepatitis C blood test.** / For any individual with known risks  for hepatitis C.  Skin self-exam. / Monthly.  Influenza vaccine. / Every year.  Tetanus, diphtheria, and acellular pertussis (Tdap, Td) vaccine.** / Consult your health care provider. 1 dose of Td every 10 years.  Varicella vaccine.** / Consult your health care provider.  HPV vaccine. / 3 doses over 6 months, if 26 or younger.  Measles, mumps, rubella (MMR) vaccine.** / You need at least 1 dose of MMR if you were born in 1957 or later. You may also need a second dose.  Pneumococcal 13-valent conjugate (PCV13) vaccine.** / Consult your health care provider.  Pneumococcal polysaccharide (PPSV23) vaccine.** / 1 to 2 doses if you smoke cigarettes or if you have certain conditions.  Meningococcal vaccine.** / 1 dose if you are age 19 to 21 years and a first-year college student living in a residence hall, or have one of several medical conditions. You may also need additional booster doses.  Hepatitis A vaccine.** / Consult your health care provider.  Hepatitis B vaccine.** / Consult your health care provider.  Haemophilus influenzae type b (Hib) vaccine.** / Consult your health care provider.  

## 2014-06-01 NOTE — Progress Notes (Signed)
Patient ID: Jason Guerrero, male   DOB: 1981-04-08, 33 y.o.   MRN: [id number]   Annual Screening Comprehensive Examination  This very nice 33 y.o.male presents for complete physical.  Patient has been followed for labile HTN and Vitamin D Deficiency.   Patient has prior hx/o of slightly elevate BP and hence has been monitored expectantly. Patient's BP has been controlled and today's BP: 104/68 mmHg.  Patient does have hx/o palpitations and was started on atenolol with marked reduction in frequency of palpitations. Patient also ha an ETT last year which was Negative. Patient denies any cardiac symptoms as chest pain, shortness of breath, dizziness or ankle swelling.   Patient's hyperlipidemia is controlled on diet and pravastatin with LDL Chol at goal , but Trig are elevated. No myalgias or med SE's. Last lipids were  Chol 167; HDL Chol 63; LDL 62; Trig210 on 01/20/2014    Finally, patient has history of Vitamin D Deficiency and last vitamin D was 83 in Aug 2014.   Medication Sig  . amitriptyline  50 MG tablet TAKE 1 TAB 3 x DAY FOR CHRONIC NECK PAIN  . VITAMIN D- 5000 UNITS TABS Take by mouth daily.  . Magnesium 250 MG TABS Take by mouth daily.   Atenolol 100 mg  Take 1 daily for palpitations  . pravastatin  40 MG tablet TAKE 1 TABLET AT BEDTIME   Allergies  Allergen Reactions  . Sulfa Antibiotics    Past Medical History  Diagnosis Date  . HLD (hyperlipidemia)   . Anxiety   . Insomnia   . Lumbago   . Tension headache   . Unspecified vitamin D deficiency    No past surgical history on file. Family History  Problem Relation Age of Onset  . Hypertension Mother   . Diabetes Mother   . Heart disease Father   . Diabetes Father   . Colon cancer Maternal Grandfather   . Lung cancer Paternal Grandfather   . Breast cancer Maternal Grandmother   . Ovarian cancer Paternal Grandmother   . Hyperlipidemia Father    History   Social History  . Marital Status: Single    Spouse Name: N/A     Number of Children: N/A  . Years of Education: N/A   Occupational History  . 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 Smoking status: Current Every Day Smoker -- 1.00 packs/day    Types: Cigarettes  . Smokeless tobacco: Not on file  . Alcohol Use: Yes     Comment: 10 beers per week  . Drug Use: No  . Sexual Activity: Not on file    ROS Constitutional: Denies fever, chills, weight loss/gain, headaches, insomnia, fatigue, night sweats or change in appetite. Eyes: Denies redness, blurred vision, diplopia, discharge, itchy or watery eyes.  ENT: Denies discharge, congestion, post nasal drip, epistaxis, sore throat, earache, hearing loss, dental pain, Tinnitus, Vertigo, Sinus pain or snoring.  Cardio: As above. Respiratory: denies cough, dyspnea, DOE, pleurisy, hoarseness, laryngitis or wheezing.  Gastrointestinal: Denies dysphagia, heartburn, reflux, water brash, pain, cramps, nausea, vomiting, bloating, diarrhea, constipation, hematemesis, melena, hematochezia, jaundice or hemorrhoids Genitourinary: Denies dysuria, frequency, urgency, nocturia, hesitancy, discharge, hematuria or flank pain Musculoskeletal: Denies arthralgia, myalgia, stiffness, Jt. Swelling, pain, limp or strain/sprain. Denies Falls. Skin: Denies puritis, rash, hives, warts, acne, eczema or change in skin lesion Neuro: No weakness, tremor, incoordination, spasms, paresthesia or pain Psychiatric: Denies confusion, memory loss or sensory loss. Denies Depression.  Endocrine: Denies change in weight, skin, hair change, nocturia, and paresthesia, diabetic polys, visual blurring or hyper / hypo glycemic episodes.  Heme/Lymph: No excessive bleeding, bruising or enlarged lymph nodes.   Physical Exam  BP 104/68  Pulse 72  Temp 98.2 F   Resp 16  Ht    Wt 147 lb 12.8 oz   BMI 21.21   General Appearance: Well nourished, in no apparent distress. Eyes: PERRLA, EOMs,  conjunctiva no swelling or erythema, normal fundi and vessels. Sinuses: No frontal/maxillary tenderness ENT/Mouth: EACs patent / TMs  nl. Nares clear without erythema, swelling, mucoid exudates. Oral hygiene is good. No erythema, swelling, or exudate. Tongue normal, non-obstructing. Tonsils not swollen or erythematous. Hearing normal.  Neck: Supple, thyroid normal. No bruits, nodes or JVD. Respiratory: Respiratory effort normal.  BS equal and clear bilateral without rales, rhonci, wheezing or stridor. Cardio: Heart sounds are normal with regular rate and rhythm and no murmurs, rubs or gallops. Peripheral pulses are normal and equal bilaterally without edema. No aortic or femoral bruits. Chest: symmetric with normal excursions and percussion.  Abdomen: Flat, soft, with bowl sounds. Nontender, no guarding, rebound, hernias, masses, or organomegaly.  Lymphatics: Non tender without lymphadenopathy.  Genitourinary: No hernias.Testes nl.  Musculoskeletal: Full ROM all peripheral extremities, joint stability, 5/5 strength, and normal gait. Skin: Warm and dry without rashes, lesions, cyanosis, clubbing or  ecchymosis.  Neuro: Cranial nerves intact, reflexes equal bilaterally. Normal muscle tone, no cerebellar symptoms. Sensation intact.  Pysch: Awake and oriented X 3with normal affect, insight and judgment appropriate.  Assessment and Plan  1. Annual Screening Examination 2. Hypertension/Palpitations - switch Atenolol to Naldolol for breakthru palpitations 3. Hyperlipidemia (Trig) 4. Vitamin D Deficiency  Continue prudent diet as discussed, weight control, BP monitoring, regular exercise, and medications as discussed.  Discussed med effects and SE's. Routine screening labs and tests as requested with regular follow-up as recommended.

## 2014-06-02 LAB — TSH: TSH: 3.024 u[IU]/mL (ref 0.350–4.500)

## 2014-06-02 LAB — URINALYSIS, MICROSCOPIC ONLY
Bacteria, UA: NONE SEEN
CRYSTALS: NONE SEEN
Casts: NONE SEEN
SQUAMOUS EPITHELIAL / LPF: NONE SEEN

## 2014-06-02 LAB — LIPID PANEL
CHOL/HDL RATIO: 2.8 ratio
CHOLESTEROL: 163 mg/dL (ref 0–200)
HDL: 59 mg/dL (ref >39)
LDL Cholesterol: 55 mg/dL (ref 0–99)
TRIGLYCERIDES: 247 mg/dL — AB (ref <150)
VLDL: 49 mg/dL — AB (ref 0–40)

## 2014-06-02 LAB — BASIC METABOLIC PANEL WITH GFR
BUN: 13 mg/dL (ref 6–23)
CO2: 27 mEq/L (ref 19–32)
Calcium: 9.8 mg/dL (ref 8.4–10.5)
Chloride: 106 mEq/L (ref 96–112)
Creat: 0.88 mg/dL (ref 0.50–1.35)
GFR, Est African American: >89 mL/min
GFR, Est Non African American: >89 mL/min
Glucose, Bld: 93 mg/dL (ref 70–99)
POTASSIUM: 4.1 meq/L (ref 3.5–5.3)
SODIUM: 140 meq/L (ref 135–145)

## 2014-06-02 LAB — HEPATIC FUNCTION PANEL
ALT: 14 U/L (ref 0–53)
AST: 17 U/L (ref 0–37)
Albumin: 4.4 g/dL (ref 3.5–5.2)
Alkaline Phosphatase: 58 U/L (ref 39–117)
BILIRUBIN DIRECT: 0.1 mg/dL (ref 0.0–0.3)
BILIRUBIN INDIRECT: 0.3 mg/dL (ref 0.2–1.2)
Total Bilirubin: 0.4 mg/dL (ref 0.2–1.2)
Total Protein: 6.9 g/dL (ref 6.0–8.3)

## 2014-06-02 LAB — MICROALBUMIN / CREATININE URINE RATIO
Creatinine, Urine: 87.2 mg/dL
Microalb Creat Ratio: 5.7 mg/g (ref 0.0–30.0)
Microalb, Ur: 0.5 mg/dL (ref 0.00–1.89)

## 2014-06-02 LAB — VITAMIN B12: Vitamin B-12: 339 pg/mL (ref 211–911)

## 2014-06-02 LAB — HEPATITIS B CORE ANTIBODY, TOTAL: Hep B Core Total Ab: NONREACTIVE

## 2014-06-02 LAB — HEPATITIS A ANTIBODY, TOTAL: Hep A Total Ab: NONREACTIVE

## 2014-06-02 LAB — TESTOSTERONE: Testosterone: 552 ng/dL (ref 300–890)

## 2014-06-02 LAB — INSULIN, FASTING: Insulin fasting, serum: 13.5 u[IU]/mL (ref 2.0–19.6)

## 2014-06-02 LAB — RPR

## 2014-06-02 LAB — HIV ANTIBODY (ROUTINE TESTING W REFLEX): HIV: NONREACTIVE

## 2014-06-02 LAB — MAGNESIUM: Magnesium: 2 mg/dL (ref 1.5–2.5)

## 2014-06-02 LAB — HEPATITIS C ANTIBODY: HCV Ab: NEGATIVE

## 2014-06-02 LAB — HEPATITIS B SURFACE ANTIBODY,QUALITATIVE: Hep B S Ab: NEGATIVE

## 2014-06-02 LAB — VITAMIN D 25 HYDROXY (VIT D DEFICIENCY, FRACTURES): Vit D, 25-Hydroxy: 80 ng/mL (ref 30–89)

## 2014-06-03 ENCOUNTER — Other Ambulatory Visit: Payer: Self-pay | Admitting: Internal Medicine

## 2014-06-03 LAB — TB SKIN TEST
INDURATION: 0 mm
TB SKIN TEST: NEGATIVE

## 2014-06-04 NOTE — Addendum Note (Signed)
Addended by: Lucky Cowboy on: 06/04/2014 05:32 PM   Modules accepted: Level of Service

## 2014-06-07 LAB — HEPATITIS B E ANTIBODY: Hepatitis Be Antibody: NONREACTIVE

## 2014-06-15 ENCOUNTER — Other Ambulatory Visit: Payer: Self-pay | Admitting: Internal Medicine

## 2014-06-21 ENCOUNTER — Other Ambulatory Visit (INDEPENDENT_AMBULATORY_CARE_PROVIDER_SITE_OTHER): Payer: Commercial Managed Care - PPO | Admitting: *Deleted

## 2014-06-21 DIAGNOSIS — Z1212 Encounter for screening for malignant neoplasm of rectum: Secondary | ICD-10-CM

## 2014-06-21 LAB — POC HEMOCCULT BLD/STL (HOME/3-CARD/SCREEN)
Card #2 Fecal Occult Blod, POC: NEGATIVE
Card #3 Fecal Occult Blood, POC: NEGATIVE
FECAL OCCULT BLD: NEGATIVE

## 2014-08-03 ENCOUNTER — Other Ambulatory Visit: Payer: Self-pay | Admitting: Internal Medicine

## 2014-08-04 ENCOUNTER — Other Ambulatory Visit: Payer: Self-pay | Admitting: Emergency Medicine

## 2014-08-04 ENCOUNTER — Other Ambulatory Visit: Payer: Self-pay | Admitting: Internal Medicine

## 2014-08-05 ENCOUNTER — Encounter: Payer: Self-pay | Admitting: Physician Assistant

## 2014-08-05 ENCOUNTER — Other Ambulatory Visit: Payer: Self-pay | Admitting: Internal Medicine

## 2014-08-05 ENCOUNTER — Encounter: Payer: Self-pay | Admitting: Internal Medicine

## 2014-08-31 ENCOUNTER — Encounter: Payer: Self-pay | Admitting: Physician Assistant

## 2014-08-31 ENCOUNTER — Ambulatory Visit (INDEPENDENT_AMBULATORY_CARE_PROVIDER_SITE_OTHER): Payer: Commercial Managed Care - PPO | Admitting: Physician Assistant

## 2014-08-31 VITALS — BP 110/78 | HR 80 | Temp 97.7°F | Resp 16 | Ht 70.0 in | Wt 150.0 lb

## 2014-08-31 DIAGNOSIS — E785 Hyperlipidemia, unspecified: Secondary | ICD-10-CM

## 2014-08-31 DIAGNOSIS — Z79899 Other long term (current) drug therapy: Secondary | ICD-10-CM

## 2014-08-31 DIAGNOSIS — F17200 Nicotine dependence, unspecified, uncomplicated: Secondary | ICD-10-CM

## 2014-08-31 DIAGNOSIS — R002 Palpitations: Secondary | ICD-10-CM

## 2014-08-31 DIAGNOSIS — I10 Essential (primary) hypertension: Secondary | ICD-10-CM

## 2014-08-31 DIAGNOSIS — Z720 Tobacco use: Secondary | ICD-10-CM

## 2014-08-31 DIAGNOSIS — E559 Vitamin D deficiency, unspecified: Secondary | ICD-10-CM

## 2014-08-31 LAB — HEPATIC FUNCTION PANEL
ALK PHOS: 58 U/L (ref 39–117)
ALT: 15 U/L (ref 0–53)
AST: 19 U/L (ref 0–37)
Albumin: 4.3 g/dL (ref 3.5–5.2)
BILIRUBIN DIRECT: 0.1 mg/dL (ref 0.0–0.3)
BILIRUBIN INDIRECT: 0.7 mg/dL (ref 0.2–1.2)
Total Bilirubin: 0.8 mg/dL (ref 0.2–1.2)
Total Protein: 7.1 g/dL (ref 6.0–8.3)

## 2014-08-31 LAB — BASIC METABOLIC PANEL WITH GFR
BUN: 12 mg/dL (ref 6–23)
CHLORIDE: 102 meq/L (ref 96–112)
CO2: 29 meq/L (ref 19–32)
Calcium: 9.8 mg/dL (ref 8.4–10.5)
Creat: 0.89 mg/dL (ref 0.50–1.35)
GFR, Est African American: >89 mL/min
GFR, Est Non African American: >89 mL/min
Glucose, Bld: 75 mg/dL (ref 70–99)
Potassium: 4.1 mEq/L (ref 3.5–5.3)
SODIUM: 138 meq/L (ref 135–145)

## 2014-08-31 LAB — LIPID PANEL
Cholesterol: 189 mg/dL (ref 0–200)
HDL: 54 mg/dL (ref >39)
LDL CALC: 64 mg/dL (ref 0–99)
TRIGLYCERIDES: 357 mg/dL — AB (ref <150)
Total CHOL/HDL Ratio: 3.5 Ratio
VLDL: 71 mg/dL — ABNORMAL HIGH (ref 0–40)

## 2014-08-31 LAB — CBC WITH DIFFERENTIAL/PLATELET
Basophils Absolute: 0.1 10*3/uL (ref 0.0–0.1)
Basophils Relative: 1 % (ref 0–1)
EOS PCT: 5 % (ref 0–5)
Eosinophils Absolute: 0.4 10*3/uL (ref 0.0–0.7)
HEMATOCRIT: 45.2 % (ref 39.0–52.0)
Hemoglobin: 16.3 g/dL (ref 13.0–17.0)
Lymphocytes Relative: 30 % (ref 12–46)
Lymphs Abs: 2.6 10*3/uL (ref 0.7–4.0)
MCH: 31.3 pg (ref 26.0–34.0)
MCHC: 36.1 g/dL — ABNORMAL HIGH (ref 30.0–36.0)
MCV: 86.8 fL (ref 78.0–100.0)
MONOS PCT: 9 % (ref 3–12)
MPV: 9.1 fL — ABNORMAL LOW (ref 9.4–12.4)
Monocytes Absolute: 0.8 10*3/uL (ref 0.1–1.0)
NEUTROS ABS: 4.8 10*3/uL (ref 1.7–7.7)
Neutrophils Relative %: 55 % (ref 43–77)
Platelets: 203 10*3/uL (ref 150–400)
RBC: 5.21 MIL/uL (ref 4.22–5.81)
RDW: 13.4 % (ref 11.5–15.5)
WBC: 8.7 10*3/uL (ref 4.0–10.5)

## 2014-08-31 LAB — TSH: TSH: 3.868 u[IU]/mL (ref 0.350–4.500)

## 2014-08-31 LAB — MAGNESIUM: MAGNESIUM: 2.1 mg/dL (ref 1.5–2.5)

## 2014-08-31 NOTE — Patient Instructions (Signed)
We are giving you chantix for smoking cessation. You can do it! And we are here to help! You may have heard some scary side effects about chantix, the three most common I hear about are nausea, crazy dreams and depression.  However, I like for my patients to try to stay on 1/2 a tablet twice a day rather than one tablet twice a day as normally prescribed. This helps decrease the chances of side effects and helps save money by making a one month prescription last two months  Please start the prescription this way:  Start 1/2 tablet by mouth once daily after food with a full glass of water for 3 days Then do 1/2 tablet by mouth twice daily for 4 days.  At this point we have several options: 1) continue on 1/2 tablet twice a day- which I encourage you to do. You can stay on this dose the rest of the time on the medication or if you still feel the need to smoke you can do one of the two options below. 2) do one tablet in the morning and 1/2 in the evening which helps decrease dreams. 3) do one tablet twice a day.   What if I miss a dose? If you miss a dose, take it as soon as you can. If it is almost time for your next dose, take only that dose. Do not take double or extra doses.  What should I watch for while using this medicine? Visit your doctor or health care professional for regular check ups. Ask for ongoing advice and encouragement from your doctor or healthcare professional, friends, and family to help you quit. If you smoke while on this medication, quit again  Your mouth may get dry. Chewing sugarless gum or hard candy, and drinking plenty of water may help. Contact your doctor if the problem does not go away or is severe.  You may get drowsy or dizzy. Do not drive, use machinery, or do anything that needs mental alertness until you know how this medicine affects you. Do not stand or sit up quickly, especially if you are an older patient.   The use of this medicine may increase the chance  of suicidal thoughts or actions. Pay special attention to how you are responding while on this medicine. Any worsening of mood, or thoughts of suicide or dying should be reported to your health care professional right away.  ADVANTAGES OF QUITTING SMOKING  Within 20 minutes, blood pressure decreases. Your pulse is at normal level.  After 8 hours, carbon monoxide levels in the blood return to normal. Your oxygen level increases.  After 24 hours, the chance of having a heart attack starts to decrease. Your breath, hair, and body stop smelling like smoke.  After 48 hours, damaged nerve endings begin to recover. Your sense of taste and smell improve.  After 72 hours, the body is virtually free of nicotine. Your bronchial tubes relax and breathing becomes easier.  After 2 to 12 weeks, lungs can hold more air. Exercise becomes easier and circulation improves.  After 1 year, the risk of coronary heart disease is cut in half.  After 5 years, the risk of stroke falls to the same as a nonsmoker.  After 10 years, the risk of lung cancer is cut in half and the risk of other cancers decreases significantly.  After 15 years, the risk of coronary heart disease drops, usually to the level of a nonsmoker.  You will have extra money   to spend on things other than cigarettes.  Palpitations A palpitation is the feeling that your heartbeat is irregular or is faster than normal. It may feel like your heart is fluttering or skipping a beat. Palpitations are usually not a serious problem. However, in some cases, you may need further medical evaluation. CAUSES  Palpitations can be caused by:  Smoking.  Caffeine or other stimulants, such as diet pills or energy drinks.  Alcohol.  Stress and anxiety.  Strenuous physical activity.  Fatigue.  Certain medicines.  Heart disease, especially if you have a history of irregular heart rhythms (arrhythmias), such as atrial fibrillation, atrial flutter, or  supraventricular tachycardia.  An improperly working pacemaker or defibrillator. DIAGNOSIS  To find the cause of your palpitations, your health care provider will take your medical history and perform a physical exam. Your health care provider may also have you take a test called an ambulatory electrocardiogram (ECG). An ECG records your heartbeat patterns over a 24-hour period. You may also have other tests, such as:  Transthoracic echocardiogram (TTE). During echocardiography, sound waves are used to evaluate how blood flows through your heart.  Transesophageal echocardiogram (TEE).  Cardiac monitoring. This allows your health care provider to monitor your heart rate and rhythm in real time.  Holter monitor. This is a portable device that records your heartbeat and can help diagnose heart arrhythmias. It allows your health care provider to track your heart activity for several days, if needed.  Stress tests by exercise or by giving medicine that makes the heart beat faster. TREATMENT  Treatment of palpitations depends on the cause of your symptoms and can vary greatly. Most cases of palpitations do not require any treatment other than time, relaxation, and monitoring your symptoms. Other causes, such as atrial fibrillation, atrial flutter, or supraventricular tachycardia, usually require further treatment. HOME CARE INSTRUCTIONS   Avoid:  Caffeinated coffee, tea, soft drinks, diet pills, and energy drinks.  Chocolate.  Alcohol.  Stop smoking if you smoke.  Reduce your stress and anxiety. Things that can help you relax include:  A method of controlling things in your body, such as your heartbeats, with your mind (biofeedback).  Yoga.  Meditation.  Physical activity such as swimming, jogging, or walking.  Get plenty of rest and sleep. SEEK MEDICAL CARE IF:   You continue to have a fast or irregular heartbeat beyond 24 hours.  Your palpitations occur more often. SEEK  IMMEDIATE MEDICAL CARE IF:  You have chest pain or shortness of breath.  You have a severe headache.  You feel dizzy or you faint. MAKE SURE YOU:  Understand these instructions.  Will watch your condition.  Will get help right away if you are not doing well or get worse. Document Released: 09/13/2000 Document Revised: 09/21/2013 Document Reviewed: 11/15/2011 [facility] Patient Information 2015 [place], [place]. This information is not intended to replace advice given to you by your health care provider. Make sure you discuss any questions you have with your health care provider.

## 2014-08-31 NOTE — Progress Notes (Signed)
Assessment and Plan:  Hypertension: Continue medication, monitor blood pressure at home. Continue DASH diet.  Reminder to go to the ER if any CP, SOB, nausea, dizziness, severe HA, changes vision/speech, left arm numbness and tingling, and jaw pain. Cholesterol: Continue diet and exercise. Check cholesterol.  Insomnia- good sleep hygiene discussed, increase day time activity, try melatonin or benadryl if this does not help we will call in sleep medication.  Vitamin D Def- check level and continue medications Smoking cessation- discussed, willing to try chantix after the holidays Palpitations- increase naldolol to 40mg  BID, call if not better, will possibly try verapamil.  .   Continue diet and meds as discussed. Further disposition pending results of labs.  HPI 33 y.o. male  presents for 3 month follow up with hypertension, hyperlipidemia, prediabetes and vitamin D. His blood pressure has been controlled at home, today their BP is BP: 110/78 mmHg He does not workout but states he walks a lot at work, states over 10,000 steps a day. He denies chest pain, shortness of breath, dizziness.  He has strong family history of premature CAD so on BP and cholesterol medications, but does smoke, he is working on quitting.  He is on cholesterol medication and denies myalgias. His cholesterol is at goal. The cholesterol last visit was:   Lab Results  Component Value Date   CHOL 163 06/01/2014   HDL 59 06/01/2014   LDLCALC 55 06/01/2014   TRIG 247* 06/01/2014   CHOLHDL 2.8 06/01/2014   Last I6NA1C in the office was:  Lab Results  Component Value Date   HGBA1C 5.4 06/01/2014   Patient is on Vitamin D supplement.   Lab Results  Component Value Date   VD25OH 80 06/01/2014     Has history of SVT, was switched from atenolol to Naldolol due to fatigue- states it is helping but has occ break through.  Has xanax at night for sleep, has had history of sleep walking, never had sleep study.   Current  Medications:  Current Outpatient Prescriptions on File Prior to Visit  Medication Sig Dispense Refill  . alprazolam (XANAX) 2 MG tablet TAKE 1 TABLET BY MOUTH AT BEDTIME AS NEEDED FOR SLEEP 30 tablet 1  . amitriptyline (ELAVIL) 50 MG tablet TAKE 1 TABLET BY MOUTH 3 TIMES A DAY FOR CHRONIC NECK PAIN 270 tablet 1  . Cholecalciferol (VITAMIN D-3) 5000 UNITS TABS Take by mouth daily.    . Magnesium 250 MG TABS Take by mouth daily.    . nadolol (CORGARD) 80 MG tablet Take 1/2 to 1 whole tablet daily for BP & palpitations 90 tablet 99  . pravastatin (PRAVACHOL) 40 MG tablet TAKE 1 TABLET BY MOUTH AT BEDTIME 90 tablet 3   No current facility-administered medications on file prior to visit.   Medical History:  Past Medical History  Diagnosis Date  . HLD (hyperlipidemia)   . Anxiety   . Insomnia   . Lumbago   . Tension headache   . Unspecified vitamin D deficiency    Allergies:  Allergies  Allergen Reactions  . Sulfa Antibiotics      Review of Systems  Constitutional: Negative for fever and chills.  HENT: Negative.   Eyes: Negative.   Respiratory: Negative for cough, shortness of breath and wheezing.   Cardiovascular: Positive for palpitations. Negative for chest pain, orthopnea, claudication, leg swelling and PND.  Gastrointestinal: Negative.   Genitourinary: Negative.   Musculoskeletal: Negative.   Skin: Negative.   Neurological: Negative for dizziness.  Endo/Heme/Allergies: Negative.   Psychiatric/Behavioral: The patient is nervous/anxious and has insomnia.   All other systems reviewed and are negative.    Family history- Review and unchanged Social history- Review and unchanged Physical Exam: BP 110/78 mmHg  Pulse 80  Temp(Src) 97.7 F (36.5 C)  Resp 16  Ht 5\' 10"  (1.778 m)  Wt 150 lb (68.04 kg)  BMI 21.52 kg/m2 Wt Readings from Last 3 Encounters:  08/31/14 150 lb (68.04 kg)  06/01/14 147 lb 12.8 oz (67.042 kg)  01/20/14 142 lb (64.411 kg)   General Appearance:  Well nourished, in no apparent distress. Eyes: PERRLA, EOMs, conjunctiva no swelling or erythema Sinuses: No Frontal/maxillary tenderness ENT/Mouth: Ext aud canals clear, TMs without erythema, bulging. No erythema, swelling, or exudate on post pharynx.  Tonsils not swollen or erythematous. Hearing normal.  Neck: Supple, thyroid normal.  Respiratory: Respiratory effort normal, BS equal bilaterally without rales, rhonchi, wheezing or stridor.  Cardio: RRR with no MRGs. Brisk peripheral pulses without edema.  Abdomen: Soft, + BS.  Non tender, no guarding, rebound, hernias, masses. Lymphatics: Non tender without lymphadenopathy.  Musculoskeletal: Full ROM, 5/5 strength, normal gait.  Skin: Warm, dry without rashes, lesions, ecchymosis.  Neuro: Cranial nerves intact. Normal muscle tone, no cerebellar symptoms. Sensation intact.  Psych: Awake and oriented X 3, normal affect, Insight and Judgment appropriate.    Quentin Mullingollier, Savina Olshefski, PA-C 12:07 PM [facility]

## 2014-09-01 LAB — VITAMIN D 25 HYDROXY (VIT D DEFICIENCY, FRACTURES): Vit D, 25-Hydroxy: 55 ng/mL (ref 30–100)

## 2014-09-15 ENCOUNTER — Ambulatory Visit: Payer: Commercial Managed Care - PPO | Admitting: Cardiology

## 2014-10-05 ENCOUNTER — Ambulatory Visit (INDEPENDENT_AMBULATORY_CARE_PROVIDER_SITE_OTHER): Payer: Commercial Managed Care - PPO | Admitting: Cardiology

## 2014-10-05 ENCOUNTER — Encounter: Payer: Self-pay | Admitting: Cardiology

## 2014-10-05 VITALS — BP 114/70 | HR 78 | Ht 70.0 in | Wt 152.8 lb

## 2014-10-05 DIAGNOSIS — R002 Palpitations: Secondary | ICD-10-CM

## 2014-10-05 DIAGNOSIS — I10 Essential (primary) hypertension: Secondary | ICD-10-CM

## 2014-10-05 DIAGNOSIS — E785 Hyperlipidemia, unspecified: Secondary | ICD-10-CM

## 2014-10-05 LAB — COMPREHENSIVE METABOLIC PANEL
ALT: 17 U/L (ref 0–53)
AST: 19 U/L (ref 0–37)
Albumin: 4.2 g/dL (ref 3.5–5.2)
Alkaline Phosphatase: 59 U/L (ref 39–117)
BUN: 12 mg/dL (ref 6–23)
CO2: 26 mEq/L (ref 19–32)
Calcium: 9.2 mg/dL (ref 8.4–10.5)
Chloride: 106 mEq/L (ref 96–112)
Creatinine, Ser: 0.9 mg/dL (ref 0.4–1.5)
GFR: 97.7 mL/min (ref >60.00)
Glucose, Bld: 82 mg/dL (ref 70–99)
Potassium: 3.6 mEq/L (ref 3.5–5.1)
Sodium: 137 mEq/L (ref 135–145)
Total Bilirubin: 0.7 mg/dL (ref 0.2–1.2)
Total Protein: 7.1 g/dL (ref 6.0–8.3)

## 2014-10-05 LAB — LIPID PANEL
Cholesterol: 159 mg/dL (ref 0–200)
HDL: 49.9 mg/dL (ref >39.00)
LDL Cholesterol: 79 mg/dL (ref 0–99)
NonHDL: 109.1
Total CHOL/HDL Ratio: 3
Triglycerides: 153 mg/dL — ABNORMAL HIGH (ref 0.0–149.0)
VLDL: 30.6 mg/dL (ref 0.0–40.0)

## 2014-10-05 NOTE — Patient Instructions (Signed)
Your physician recommends that you continue on your current medications as directed. Please refer to the Current Medication list given to you today.    Your physician recommends that you return for lab work in: TODAY (LIPIDS AND CMET)     Your physician wants you to follow-up in: ONE YEAR WITH DR Johnell ComingsNELSON You will receive a reminder letter in the mail two months in advance. If you don't receive a letter, please call our office to schedule the follow-up appointment.

## 2014-10-05 NOTE — Progress Notes (Signed)
Patient ID: Jason Guerrero, male   DOB: [birth year], 34 y.o.   MRN: [id number]      Patient ID: Jason Guerrero, male   DOB: 1981/09/02, 34 y.o.   MRN: [id number]  Patient Name: Jason Guerrero Date of Encounter: 10/05/2014  Primary Care Provider:  Nadean Corwin, MD Primary Cardiologist:  Tobias Alexander H  Patient Profile  Palpitations, hypertension, chest tightness  Problem List   Past Medical History  Diagnosis Date  . HLD (hyperlipidemia)   . Anxiety   . Insomnia   . Lumbago   . Tension headache   . Unspecified vitamin D deficiency    No past surgical history on file.  Allergies  Allergies  Allergen Reactions  . Sulfa Antibiotics    HPI  [name] with h/o hyperlipidemia controlled by pravastatin, who is ongoing smoker and comes for concerns of hypertension, tachycardia and palpitation. 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 He has noticed at work that he feels palpitations and some chest tightness at the lower sternum that last for few minutes. He also experiences it at home sometimes on his days off. There is no associated syncope, or SOB.  On two occassions when he checked his BP it was elevated. Once, when he was sick and vomiting it was 160, another time he checked it on the way home from work and it was 146.  He drink few beers after work daily. He is also concerned because his uncle has CAD and underwent CABG x4 at age 10.   10/05/2013 - coming after 1 year, still smoking. His most recent lipids on 08/31/2014 showed HDL 59, LDL 55, triglycerides 247. Patient admits that he is drinking significant amount of alcohol. He is fairly active at work or he walks a lot he continues working nights. He is now taking amitriptyline for chronic neck pain. He denies any chest pain or exertional shortness of breath. He is stress test last year showed an excellent functional capacity normal blood pressure response and  no ischemic changes on his EKG. He will switch from atenolol to another low by his primary care physician he states that his palpitations are tolerable even though they continue to happen he will feel it on almost every day even at rest.  Home Medications  Prior to Admission medications   Medication Sig Start Date End Date Taking? Authorizing Provider  alprazolam Prudy Feeler) 2 MG tablet Take 2 mg by mouth at bedtime as needed for sleep.   Yes Historical Provider, MD  amitriptyline (ELAVIL) 50 MG tablet Take 50 mg by mouth 3 (three) times daily as needed for sleep (back spasms).   Yes Historical Provider, MD  atenolol (TENORMIN) 50 MG tablet Take 25 mg by mouth daily.  05/26/13  Yes Historical Provider, MD  Cholecalciferol (VITAMIN D-3) 5000 UNITS TABS Take by mouth daily.   Yes Historical Provider, MD  Magnesium 250 MG TABS Take by mouth daily.   Yes Historical Provider, MD  pravastatin (PRAVACHOL) 40 MG tablet Take 20 mg by mouth daily.    Yes Historical Provider, MD    Family History  Family History  Problem Relation Age of Onset  . Hypertension Mother   . Diabetes Mother   . Heart disease Father   . Diabetes Father   . Colon cancer Maternal Grandfather   . Lung cancer Paternal Grandfather   . Breast cancer Maternal Grandmother   . Ovarian cancer  Paternal Grandmother   . Hyperlipidemia Father     Social History  History   Social History  . Marital Status: Single    Spouse Name: N/A    Number of Children: N/A  . Years of Education: N/A   Occupational History  . Not on file.   Social History Main Topics  . Smoking status: Current Every Day Smoker -- 1.00 packs/day    Types: Cigarettes  . Smokeless tobacco: Not on file  . Alcohol Use: Yes     Comment: 10 beers per week  . Drug Use: No  . Sexual Activity: Not on file   Other Topics Concern  . Not on file   Social History Narrative     Review of Systems General:  No chills, fever, night sweats or weight changes.    Cardiovascular:  + chest tightness, dyspnea on exertion, edema, orthopnea, + palpitations, paroxysmal nocturnal dyspnea. Dermatological: No rash, lesions/masses Respiratory: No cough, dyspnea Urologic: No hematuria, dysuria Abdominal:   No nausea, vomiting, diarrhea, bright red blood per rectum, melena, or hematemesis Neurologic:  No visual changes, wkns, changes in mental status. All other systems reviewed and are otherwise negative except as noted above.  Physical Exam  BP 112/80, HR 85  General: Pleasant, NAD Psych: Normal affect. Neuro: Alert and oriented X 3. Moves all extremities spontaneously. HEENT: Normal  Neck: Supple without bruits or JVD. Lungs:  Resp regular and unlabored, CTA. Heart: RRR no s3, s4, or murmurs. Abdomen: Soft, non-tender, non-distended, BS + x 4.  Extremities: No clubbing, cyanosis or edema. DP/PT/Radials 2+ and equal bilaterally.  Accessory Clinical Findings  ECG - Normal SR, no ST changes  GXT: 09/20/2014 ST Segment Analysis At Rest: normal ST segments - no evidence of significant ST depression With Exercise: no evidence of significant ST depression  Other Information Arrhythmia:  No Angina during ETT:  present (1) Quality of ETT:  diagnostic  ETT Interpretation:  normal - no evidence of ischemia by ST analysis  Comments: Excellent exercise capacity. Patient did complain of chest pain (tightness) throughout. Normal BP response to exercise. No ST changes to suggest ischemia.  No atrial or ventricular arrhythmias.  Recommendations: F/u with Dr. Tobias AlexanderKatarina Jastin Fore as directed. Signed,  Tereso NewcomerScott Weaver, PA-C   09/20/2013 3:05 PM      Assessment & Plan  34 year old male   1. Palpitations with chest tightness - stress related, the patient takes Xanax for anxiety and states its usually when it wears off. 48 hour Holter showed Holter monitor showed persistent sinus tachycardia starting at 4 pm till 3 am, exactly when the patient is at work.  No other arrhythmias. He is currently taking Nadolol 40 mg po BID. He still reports palpitations.  An exercise treadmill stress test to assess his ECG and symptoms during exertion as well as BP response during exercise is scheduled for the next week (the patient rescheduled).   2. Hypertension - on nadolol.  3. Hyperlipidemia - LDL, HDL normal, high TAG, the patient admits to drinking the night before. He is on Pravastatin currently. We will repeat today, if elevated start fenofibrate 160 mg po daily. Intolerant to fish oil (upset stomach).  4. Smoking cessation counseling provided. He will try nicotine patches. He is advised if he starts taking Chantix he should stop taking amitriptyline.  Follow up in 1 year. We will call the patient with the results of lipid profile.   Lars MassonNELSON, Gay Moncivais H, MD 10/05/2014, 12:43 PM

## 2014-10-09 ENCOUNTER — Other Ambulatory Visit: Payer: Self-pay | Admitting: Internal Medicine

## 2014-10-09 ENCOUNTER — Encounter: Payer: Self-pay | Admitting: Physician Assistant

## 2014-10-10 ENCOUNTER — Other Ambulatory Visit: Payer: Self-pay | Admitting: *Deleted

## 2014-10-10 MED ORDER — NADOLOL 80 MG PO TABS: ORAL_TABLET | ORAL | Status: DC

## 2014-10-14 ENCOUNTER — Encounter: Payer: Self-pay | Admitting: Physician Assistant

## 2014-11-30 ENCOUNTER — Ambulatory Visit (INDEPENDENT_AMBULATORY_CARE_PROVIDER_SITE_OTHER): Payer: Commercial Managed Care - PPO | Admitting: Physician Assistant

## 2014-11-30 ENCOUNTER — Encounter: Payer: Self-pay | Admitting: Internal Medicine

## 2014-11-30 VITALS — BP 106/72 | HR 76 | Temp 98.1°F | Resp 16 | Ht 70.0 in | Wt 146.6 lb

## 2014-11-30 DIAGNOSIS — I10 Essential (primary) hypertension: Secondary | ICD-10-CM

## 2014-11-30 DIAGNOSIS — Z79899 Other long term (current) drug therapy: Secondary | ICD-10-CM

## 2014-11-30 DIAGNOSIS — M26609 Unspecified temporomandibular joint disorder, unspecified side: Secondary | ICD-10-CM

## 2014-11-30 DIAGNOSIS — M542 Cervicalgia: Secondary | ICD-10-CM

## 2014-11-30 DIAGNOSIS — F17200 Nicotine dependence, unspecified, uncomplicated: Secondary | ICD-10-CM

## 2014-11-30 DIAGNOSIS — Z720 Tobacco use: Secondary | ICD-10-CM

## 2014-11-30 DIAGNOSIS — E785 Hyperlipidemia, unspecified: Secondary | ICD-10-CM

## 2014-11-30 DIAGNOSIS — R002 Palpitations: Secondary | ICD-10-CM

## 2014-11-30 DIAGNOSIS — E559 Vitamin D deficiency, unspecified: Secondary | ICD-10-CM

## 2014-11-30 LAB — CBC WITH DIFFERENTIAL/PLATELET
BASOS ABS: 0.1 10*3/uL (ref 0.0–0.1)
BASOS PCT: 1 % (ref 0–1)
EOS PCT: 5 % (ref 0–5)
Eosinophils Absolute: 0.4 10*3/uL (ref 0.0–0.7)
HCT: 46.3 % (ref 39.0–52.0)
Hemoglobin: 16.2 g/dL (ref 13.0–17.0)
Lymphocytes Relative: 40 % (ref 12–46)
Lymphs Abs: 2.9 10*3/uL (ref 0.7–4.0)
MCH: 31.5 pg (ref 26.0–34.0)
MCHC: 35 g/dL (ref 30.0–36.0)
MCV: 89.9 fL (ref 78.0–100.0)
MPV: 9 fL (ref 8.6–12.4)
Monocytes Absolute: 0.7 10*3/uL (ref 0.1–1.0)
Monocytes Relative: 10 % (ref 3–12)
NEUTROS ABS: 3.2 10*3/uL (ref 1.7–7.7)
Neutrophils Relative %: 44 % (ref 43–77)
PLATELETS: 202 10*3/uL (ref 150–400)
RBC: 5.15 MIL/uL (ref 4.22–5.81)
RDW: 13.5 % (ref 11.5–15.5)
WBC: 7.3 10*3/uL (ref 4.0–10.5)

## 2014-11-30 LAB — TSH: TSH: 3.643 u[IU]/mL (ref 0.350–4.500)

## 2014-11-30 MED ORDER — MELOXICAM 15 MG PO TABS: ORAL_TABLET | ORAL | Status: DC

## 2014-11-30 NOTE — Patient Instructions (Addendum)
What is the TMJ? The temporomandibular (tem-PUH-ro-man-DIB-yoo-ler) joint, or the TMJ, connects the upper and lower jawbones. This joint allows the jaw to open wide and move back and forth when you chew, talk, or yawn.There are also several muscles that help this joint move. There can be muscle tightness and pain in the muscle that can cause several symptoms.  What causes TMJ pain? There are many causes of TMJ pain. Repeated chewing (for example, chewing gum) and clenching your teeth can cause pain in the joint. Some TMJ pain has no obvious cause. What can I do to ease the pain? There are many things you can do to help your pain get better. When you have pain:  Eat soft foods and stay away from chewy foods (for example, taffy) Try to use both sides of your mouth to chew Don't chew gum Massage Don't open your mouth wide (for example, during yawning or singing) Don't bite your cheeks or fingernails Lower your amount of stress and worry Applying a warm, damp washcloth to the joint may help. Over-the-counter pain medicines such as ibuprofen (one brand: Advil) or acetaminophen (one brand: Tylenol) might also help. Do not use these medicines if you are allergic to them or if your doctor told you not to use them. How can I stop the pain from coming back? When your pain is better, you can do these exercises to make your muscles stronger and to keep the pain from coming back:  Resisted mouth opening: Place your thumb or two fingers under your chin and open your mouth slowly, pushing up lightly on your chin with your thumb. Hold for three to six seconds. Close your mouth slowly. Resisted mouth closing: Place your thumbs under your chin and your two index fingers on the ridge between your mouth and the bottom of your chin. Push down lightly on your chin as you close your mouth. Tongue up: Slowly open and close your mouth while keeping the tongue touching the roof of the mouth. Side-to-side jaw movement:  Place an object about one fourth of an inch thick (for example, two tongue depressors) between your front teeth. Slowly move your jaw from side to side. Increase the thickness of the object as the exercise becomes easier Forward jaw movement: Place an object about one fourth of an inch thick between your front teeth and move the bottom jaw forward so that the bottom teeth are in front of the top teeth. Increase the thickness of the object as the exercise becomes easier. These exercises should not be painful. If it hurts to do these exercises, stop doing them and talk to your family doctor.   Get cervical neck pillow

## 2014-11-30 NOTE — Progress Notes (Signed)
Assessment and Plan:  Hypertension: Continue medication, monitor blood pressure at home. Continue DASH diet.  Reminder to go to the ER if any CP, SOB, nausea, dizziness, severe HA, changes vision/speech, left arm numbness and tingling, and jaw pain. Cholesterol: Continue diet and exercise. Check cholesterol.  Pre-diabetes-Continue diet and exercise. Check A1C Vitamin D Def- check level and continue medications.  Neck pain- get cervical neck pillow, declines PT for posture- declines trigger point for now- will give mobic Headache- ? TMJ versus wisdom tooth- go to dentist- information given to the patient, no gum/decrease hard foods, warm wet wash clothes, decrease stress, talk with dentist about possible night guard, can do massage, and exercise.  Right foot pain- wear better arch  Continue diet and meds as discussed. Further disposition pending results of labs. OVER 40 minutes of exam, counseling, chart review, referral performed   HPI 34 y.o. male  presents for 3 month follow up with hypertension, hyperlipidemia, prediabetes and vitamin D.  His blood pressure has been controlled at home, today their BP is BP: 106/72 mmHg  He does not workout. He denies chest pain, shortness of breath, dizziness.  He is on cholesterol medication and denies myalgias. His cholesterol is at goal. The cholesterol last visit was:   Lab Results  Component Value Date   CHOL 159 10/05/2014   HDL 49.90 10/05/2014   LDLCALC 79 10/05/2014   TRIG 153.0* 10/05/2014   CHOLHDL 3 10/05/2014  Last A1C in the office was:  Lab Results  Component Value Date   HGBA1C 5.4 06/01/2014  Patient is on Vitamin D supplement.   Lab Results  Component Value Date   VD25OH 55 08/31/2014  [name] complains of left arm numbness through out the day, intermittent for several months but worse for last month. He has some neck pain and ome "knots" that are tender on left traps, he also complains of left eye pain, very acute, will  happen later in the evening, no changes in vision/water eyes/nose. Normal stress test.  He is still smoking but has cut back greatly.  History of SVT, increase naldolol to 40mg  BID which has helped, recently saw Dr. Delton SeeNelson and doing well.  Some chest tightness worse in the evening, worse with movement/picking things up, no accompaniments.   Current Medications:  Current Outpatient Prescriptions on File Prior to Visit  Medication Sig Dispense Refill  . alprazolam (XANAX) 2 MG tablet TAKE 1 TABLET BY MOUTH AT BEDTIME AS NEEDED FOR SLEEP 30 tablet 2  . amitriptyline (ELAVIL) 50 MG tablet TAKE 1 TABLET BY MOUTH 3 TIMES A DAY FOR CHRONIC NECK PAIN 270 tablet 1  . Cholecalciferol (VITAMIN D-3) 5000 UNITS TABS Take by mouth daily.    . Magnesium 250 MG TABS Take by mouth daily.    . nadolol (CORGARD) 80 MG tablet TAKE 1/2 TO 1 TABLET DAILY FOR BLOOD PRESSURE AND PALPITATIONS (INS PAYS FOR 30 DAYS) 30 tablet 2  . pravastatin (PRAVACHOL) 40 MG tablet TAKE 1 TABLET BY MOUTH AT BEDTIME 90 tablet 3   No current facility-administered medications on file prior to visit.   Medical History:  Past Medical History  Diagnosis Date  . HLD (hyperlipidemia)   . Anxiety   . Insomnia   . Lumbago   . Tension headache   . Unspecified vitamin D deficiency    Allergies:  Allergies  Allergen Reactions  . Sulfa Antibiotics      Review of Systems:  Review of Systems  Constitutional: Negative.  HENT: Negative for congestion, ear discharge, ear pain, hearing loss, nosebleeds, sore throat and tinnitus.   Eyes: Negative.   Respiratory: Negative.  Negative for stridor.   Cardiovascular: Positive for chest pain. Negative for palpitations, orthopnea, claudication, leg swelling and PND.  Gastrointestinal: Negative.   Genitourinary: Negative.   Musculoskeletal: Positive for myalgias, back pain and neck pain. Negative for joint pain and falls.  Skin: Negative.   Neurological: Positive for headaches. Negative for  dizziness, tingling, tremors, sensory change, speech change, focal weakness and seizures.  Psychiatric/Behavioral: Negative.     Family history- Review and unchanged Social history- Review and unchanged Physical Exam: BP 106/72 mmHg  Pulse 76  Temp(Src) 98.1 F (36.7 C)  Resp 16  Ht  (1.778 m)  Wt 146 lb 9.6 oz (66.497 kg)  BMI 21.03 kg/m2 Wt Readings from Last 3 Encounters:  11/30/14 146 lb 9.6 oz (66.497 kg)  10/05/14 152 lb 12.8 oz (69.31 kg)  08/31/14 150 lb (68.04 kg)   General Appearance: Well nourished, in no apparent distress. Eyes: PERRLA, EOMs, conjunctiva no swelling or erythema Sinuses: No Frontal/maxillary tenderness ENT/Mouth: Ext aud canals clear, TMs without erythema, bulging. No erythema, swelling, or exudate on post pharynx.  Tonsils not swollen or erythematous. Hearing normal. + TMJ tenderness left VERSUS tooth Neck: Supple, thyroid normal.  Respiratory: Respiratory effort normal, BS equal bilaterally without rales, rhonchi, wheezing or stridor.  Cardio: RRR with no MRGs. Brisk peripheral pulses without edema.  Abdomen: Soft, + BS,  Non tender, no guarding, rebound, hernias, masses. Lymphatics: Non tender without lymphadenopathy.  Musculoskeletal: Full ROM, 5/5 strength, Normal gait. normal range of motion, without spinous process tenderness, with paraspinal muscle tenderness the left side, normal sensation, reflexes, and pulses distal. Skin: Warm, dry without rashes, lesions, ecchymosis.  Neuro: Cranial nerves intact. Normal muscle tone, no cerebellar symptoms. Psych: Awake and oriented X 3, normal affect, Insight and Judgment appropriate.    Quentin Mulling, PA-C 12:40 PM [facility]

## 2014-12-01 LAB — BASIC METABOLIC PANEL WITH GFR
BUN: 13 mg/dL (ref 6–23)
CO2: 26 mEq/L (ref 19–32)
Calcium: 9.5 mg/dL (ref 8.4–10.5)
Chloride: 103 mEq/L (ref 96–112)
Creat: 0.88 mg/dL (ref 0.50–1.35)
GFR, Est African American: >89 mL/min
Glucose, Bld: 72 mg/dL (ref 70–99)
Potassium: 4 mEq/L (ref 3.5–5.3)
SODIUM: 140 meq/L (ref 135–145)

## 2014-12-01 LAB — LIPID PANEL
CHOLESTEROL: 203 mg/dL — AB (ref 0–200)
HDL: 46 mg/dL (ref >=40)
TRIGLYCERIDES: 449 mg/dL — AB (ref <150)
Total CHOL/HDL Ratio: 4.4 Ratio

## 2014-12-01 LAB — HEPATIC FUNCTION PANEL
ALK PHOS: 50 U/L (ref 39–117)
ALT: 15 U/L (ref 0–53)
AST: 17 U/L (ref 0–37)
Albumin: 4.3 g/dL (ref 3.5–5.2)
BILIRUBIN INDIRECT: 0.6 mg/dL (ref 0.2–1.2)
Bilirubin, Direct: 0.1 mg/dL (ref 0.0–0.3)
Total Bilirubin: 0.7 mg/dL (ref 0.2–1.2)
Total Protein: 7 g/dL (ref 6.0–8.3)

## 2014-12-01 LAB — VITAMIN D 25 HYDROXY (VIT D DEFICIENCY, FRACTURES): VIT D 25 HYDROXY: 66 ng/mL (ref 30–100)

## 2014-12-01 LAB — MAGNESIUM: Magnesium: 2.2 mg/dL (ref 1.5–2.5)

## 2014-12-15 ENCOUNTER — Encounter: Payer: Self-pay | Admitting: Physician Assistant

## 2015-01-10 ENCOUNTER — Other Ambulatory Visit: Payer: Self-pay | Admitting: Internal Medicine

## 2015-02-13 ENCOUNTER — Other Ambulatory Visit: Payer: Self-pay | Admitting: Physician Assistant

## 2015-03-06 ENCOUNTER — Encounter: Payer: Self-pay | Admitting: Physician Assistant

## 2015-03-06 ENCOUNTER — Ambulatory Visit (INDEPENDENT_AMBULATORY_CARE_PROVIDER_SITE_OTHER): Payer: Commercial Managed Care - PPO | Admitting: Physician Assistant

## 2015-03-06 VITALS — BP 102/62 | HR 80 | Temp 97.9°F | Resp 16 | Ht 70.0 in | Wt 140.0 lb

## 2015-03-06 DIAGNOSIS — E785 Hyperlipidemia, unspecified: Secondary | ICD-10-CM

## 2015-03-06 DIAGNOSIS — R002 Palpitations: Secondary | ICD-10-CM

## 2015-03-06 DIAGNOSIS — E559 Vitamin D deficiency, unspecified: Secondary | ICD-10-CM

## 2015-03-06 DIAGNOSIS — F17200 Nicotine dependence, unspecified, uncomplicated: Secondary | ICD-10-CM

## 2015-03-06 DIAGNOSIS — M26609 Unspecified temporomandibular joint disorder, unspecified side: Secondary | ICD-10-CM

## 2015-03-06 DIAGNOSIS — M542 Cervicalgia: Secondary | ICD-10-CM

## 2015-03-06 DIAGNOSIS — I10 Essential (primary) hypertension: Secondary | ICD-10-CM

## 2015-03-06 DIAGNOSIS — Z79899 Other long term (current) drug therapy: Secondary | ICD-10-CM

## 2015-03-06 LAB — CBC WITH DIFFERENTIAL/PLATELET
BASOS ABS: 0.1 10*3/uL (ref 0.0–0.1)
BASOS PCT: 1 % (ref 0–1)
Eosinophils Absolute: 0.4 10*3/uL (ref 0.0–0.7)
Eosinophils Relative: 5 % (ref 0–5)
HCT: 44.1 % (ref 39.0–52.0)
Hemoglobin: 15.6 g/dL (ref 13.0–17.0)
Lymphocytes Relative: 41 % (ref 12–46)
Lymphs Abs: 2.9 10*3/uL (ref 0.7–4.0)
MCH: 32 pg (ref 26.0–34.0)
MCHC: 35.4 g/dL (ref 30.0–36.0)
MCV: 90.4 fL (ref 78.0–100.0)
MPV: 9 fL (ref 8.6–12.4)
Monocytes Absolute: 0.6 10*3/uL (ref 0.1–1.0)
Monocytes Relative: 8 % (ref 3–12)
NEUTROS PCT: 45 % (ref 43–77)
Neutro Abs: 3.2 10*3/uL (ref 1.7–7.7)
Platelets: 166 10*3/uL (ref 150–400)
RBC: 4.88 MIL/uL (ref 4.22–5.81)
RDW: 13.7 % (ref 11.5–15.5)
WBC: 7.1 10*3/uL (ref 4.0–10.5)

## 2015-03-06 LAB — TSH: TSH: 2.055 u[IU]/mL (ref 0.350–4.500)

## 2015-03-06 LAB — HEPATIC FUNCTION PANEL
ALK PHOS: 53 U/L (ref 39–117)
ALT: 19 U/L (ref 0–53)
AST: 18 U/L (ref 0–37)
Albumin: 4.1 g/dL (ref 3.5–5.2)
Bilirubin, Direct: 0.1 mg/dL (ref 0.0–0.3)
Indirect Bilirubin: 0.3 mg/dL (ref 0.2–1.2)
Total Bilirubin: 0.4 mg/dL (ref 0.2–1.2)
Total Protein: 6.7 g/dL (ref 6.0–8.3)

## 2015-03-06 LAB — BASIC METABOLIC PANEL WITH GFR
BUN: 12 mg/dL (ref 6–23)
CALCIUM: 9.6 mg/dL (ref 8.4–10.5)
CO2: 25 mEq/L (ref 19–32)
Chloride: 104 mEq/L (ref 96–112)
Creat: 0.81 mg/dL (ref 0.50–1.35)
GFR, Est African American: >89 mL/min
GFR, Est Non African American: >89 mL/min
GLUCOSE: 80 mg/dL (ref 70–99)
POTASSIUM: 3.9 meq/L (ref 3.5–5.3)
Sodium: 141 mEq/L (ref 135–145)

## 2015-03-06 LAB — LIPID PANEL
CHOL/HDL RATIO: 4 ratio
Cholesterol: 182 mg/dL (ref 0–200)
HDL: 45 mg/dL (ref >=40)
LDL Cholesterol: 79 mg/dL (ref 0–99)
TRIGLYCERIDES: 292 mg/dL — AB (ref <150)
VLDL: 58 mg/dL — AB (ref 0–40)

## 2015-03-06 LAB — MAGNESIUM: MAGNESIUM: 2 mg/dL (ref 1.5–2.5)

## 2015-03-06 NOTE — Progress Notes (Signed)
Assessment and Plan:  1. Hypertension -Continue medication, monitor blood pressure at home. Continue DASH diet.  Reminder to go to the ER if any CP, SOB, nausea, dizziness, severe HA, changes vision/speech, left arm numbness and tingling and jaw pain.  2. Cholesterol -Continue diet and exercise. Check cholesterol. He is not fasting today.   3. Neck pain Suggest PT, exercises given.   4. Vitamin D Def - check level and continue medications.   5. Anxiety-  Declines medications at this time, if he chooses to start will do low dose zoloft 25mg   stress management techniques discussed, increase water, good sleep hygiene discussed, increase exercise, and increase veggies.   6. Crowded mouth ? Sleep apnea, will read over and decide if he would like a study.   Continue diet and meds as discussed. Further disposition pending results of labs. Over 30 minutes of exam, counseling, chart review, and critical decision making was performed  HPI 34 y.o. male  presents for 3 month follow up on hypertension, cholesterol, prediabetes, and vitamin D deficiency.   His blood pressure has been controlled at home, today their BP is BP: 102/62 mmHg  He does not workout. He denies chest pain, shortness of breath, dizziness. Has history of SVT, on naldolol 40mg  BID and follows with Dr. Delton SeeNelson.  He has some chest pain without fast heart rates mainly at work and associated with anxiety.   He is on cholesterol medication, pravastatin 40 and denies myalgias. He can not tolerate fish oil due to constipation.  His cholesterol is not at goal. The cholesterol last visit was:   Lab Results  Component Value Date   CHOL 203* 11/30/2014   HDL 46 11/30/2014   LDLCALC NOT CALC 11/30/2014   TRIG 449* 11/30/2014   CHOLHDL 4.4 11/30/2014   Last Z6XA1C in the office was:  Lab Results  Component Value Date   HGBA1C 5.4 06/01/2014  Patient is on Vitamin D supplement.   Lab Results  Component Value Date   VD25OH 66  11/30/2014   [name] complains of left arm numbness through out the day, intermittent for several months but worse for last month. He has some neck pain and complains of "knots" that are tender on left traps. Normal stress test.     Current Medications:  Current Outpatient Prescriptions on File Prior to Visit  Medication Sig Dispense Refill  . alprazolam (XANAX) 2 MG tablet TAKE 1 TABLET AT BEDTIME AS NEEDED FOR SLEEP 30 tablet 2  . amitriptyline (ELAVIL) 50 MG tablet TAKE 1 TABLET BY MOUTH 3 TIMES A DAY FOR CHRONIC NECK PAIN 270 tablet 1  . Cholecalciferol (VITAMIN D-3) 5000 UNITS TABS Take by mouth daily.    . Magnesium 250 MG TABS Take by mouth daily.    . meloxicam (MOBIC) 15 MG tablet Take once daily as needed daily with food for pain, do not take aleve/ibuprofen with it, can take tylenol 90 tablet 0  . nadolol (CORGARD) 80 MG tablet TAKE 1/2 TO 1 TABLET DAILY FOR BLOOD PRESSURE AND PALPITATIONS (INS PAYS FOR 30 DAYS) 30 tablet 2  . pravastatin (PRAVACHOL) 40 MG tablet TAKE 1 TABLET BY MOUTH AT BEDTIME 90 tablet 3   No current facility-administered medications on file prior to visit.   Medical History:  Past Medical History  Diagnosis Date  . HLD (hyperlipidemia)   . Anxiety   . Insomnia   . Lumbago   . Tension headache   . Unspecified vitamin D deficiency  Allergies:  Allergies  Allergen Reactions  . Sulfa Antibiotics      Review of Systems:  Review of Systems  Constitutional: Positive for malaise/fatigue. Negative for fever, chills, weight loss and diaphoresis.  HENT: Negative for congestion, ear discharge, ear pain, hearing loss, nosebleeds, sore throat and tinnitus.   Respiratory: Negative.  Negative for stridor.   Cardiovascular: Negative.   Gastrointestinal: Negative.   Genitourinary: Negative.   Musculoskeletal: Positive for myalgias, back pain and neck pain. Negative for joint pain and falls.  Neurological: Positive for headaches. Negative for  dizziness, tingling, tremors, sensory change, speech change, focal weakness, seizures, loss of consciousness and weakness.  Psychiatric/Behavioral: Negative for depression, suicidal ideas, hallucinations, memory loss and substance abuse. The patient is nervous/anxious and has insomnia.     Family history- Review and unchanged Social history- Review and unchanged Physical Exam: BP 102/62 mmHg  Pulse 80  Temp(Src) 97.9 F (36.6 C)  Resp 16  Ht  (1.778 m)  Wt 140 lb (63.504 kg)  BMI 20.09 kg/m2 Wt Readings from Last 3 Encounters:  03/06/15 140 lb (63.504 kg)  11/30/14 146 lb 9.6 oz (66.497 kg)  10/05/14 152 lb 12.8 oz (69.31 kg)   General Appearance: Well nourished, in no apparent distress. Eyes: PERRLA, EOMs, conjunctiva no swelling or erythema Sinuses: No Frontal/maxillary tenderness ENT/Mouth: Ext aud canals clear, TMs without erythema, bulging. No erythema, swelling, or exudate on post pharynx.  Tonsils not swollen or erythematous. Hearing normal. + TMJ and crowded mouth.  Neck: Supple, thyroid normal.  Respiratory: Respiratory effort normal, BS equal bilaterally without rales, rhonchi, wheezing or stridor.  Cardio: RRR with no MRGs. Brisk peripheral pulses without edema.  Abdomen: Soft, + BS,  Non tender, no guarding, rebound, hernias, masses. Lymphatics: Non tender without lymphadenopathy.  Musculoskeletal: Full ROM, 5/5 strength, Normal gait. normal range of motion of neck, without spinous process tenderness, with paraspinal muscle tenderness the left side, normal sensation, reflexes, and pulses distal. Skin: Warm, dry without rashes, lesions, ecchymosis.  Neuro: Cranial nerves intact. Normal muscle tone, no cerebellar symptoms. Psych: Awake and oriented X 3, normal affect, Insight and Judgment appropriate.    Quentin Mulling, PA-C 11:54 AM [facility]

## 2015-03-06 NOTE — Patient Instructions (Addendum)
Your trigs are not in range, 11/30/2014: Triglycerides 449*. Triglycerides are simple sugars in blood that are converted into a storage form. I recommend you avoid fried/greasy foods, sweets/candy, white rice , white potatoes,  anything made from white flour, sweet tea, soda, fruit juices and avoid alcohol in excess. Sweet potatoes, brown/wild rice/Quinoa, Vegetarian, spinach, or wheat pasta, Multi-grain bread - like multi-grain flat bread or sandwich thins are okay.Can try flax seed oil.   This is elevated enough to cause acute pancreatitis which can put you in the hospital and kill you. VERY VERY important to be strict with diet.   Your LDL is not in range, 11/30/2014: LDL (calc) NOT CALC. Your LDL is the bad cholesterol that can lead to heart attack and stroke. To lower your number you can decrease your fatty foods, red meat, cheese, milk and increase fiber like whole grains and veggies. You can also add a fiber supplement like Metamucil or Benefiber.   If you want for your neck pain we can send you to PT for therapy and for something called dry needling. Please let us know if you want the referral.    Head to feet massage  What is the TMJ? The temporomandibular (tem-PUH-ro-man-DIB-yoo-ler) joint, or the TMJ, connects the upper and lower jawbones. This joint allows the jaw to open wide and move back and forth when you chew, talk, or yawn.There are also several muscles that help this joint move. There can be muscle tightness and pain in the muscle that can cause several symptoms.  What causes TMJ pain? There are many causes of TMJ pain. Repeated chewing (for example, chewing gum) and clenching your teeth can cause pain in the joint. Some TMJ pain has no obvious cause. What can I do to ease the pain? There are many things you can do to help your pain get better. When you have pain:  Eat soft foods and stay away from chewy foods (for example, taffy) Try to use both sides of your mouth to chew Don't  chew gum Massage Don't open your mouth wide (for example, during yawning or singing) Don't bite your cheeks or fingernails Lower your amount of stress and worry Applying a warm, damp washcloth to the joint may help. Over-the-counter pain medicines such as ibuprofen (one brand: Advil) or acetaminophen (one brand: Tylenol) might also help. Do not use these medicines if you are allergic to them or if your doctor told you not to use them. How can I stop the pain from coming back? When your pain is better, you can do these exercises to make your muscles stronger and to keep the pain from coming back:  Resisted mouth opening: Place your thumb or two fingers under your chin and open your mouth slowly, pushing up lightly on your chin with your thumb. Hold for three to six seconds. Close your mouth slowly. Resisted mouth closing: Place your thumbs under your chin and your two index fingers on the ridge between your mouth and the bottom of your chin. Push down lightly on your chin as you close your mouth. Tongue up: Slowly open and close your mouth while keeping the tongue touching the roof of the mouth. Side-to-side jaw movement: Place an object about one fourth of an inch thick (for example, two tongue depressors) between your front teeth. Slowly move your jaw from side to side. Increase the thickness of the object as the exercise becomes easier Forward jaw movement: Place an object about one fourth of an inch  thick between your front teeth and move the bottom jaw forward so that the bottom teeth are in front of the top teeth. Increase the thickness of the object as the exercise becomes easier. These exercises should not be painful. If it hurts to do these exercises, stop doing them and talk to your family doctor.  I think it is possible that you have sleep apnea. It can cause interrupted sleep, headaches, frequent awakenings, fatigue, dry mouth, fast/slow heart beats, memory issues, anxiety/depression,  swelling, numbness tingling hands/feet, weight gain, shortness of breath, and the list goes on. Sleep apnea needs to be ruled out because if it is left untreated it does eventually lead to abnormal heart beats, lung failure or heart failure as well as increasing the risk of heart attack and stroke. There are masks you can wear OR a mouth piece that I can give you information about. Often times though people feel MUCH better after getting treatment.   Sleep Apnea   Can call Dr. Toni Arthurs to get evaluated for dental sleep appliance. # [phone]  OR you can try Dr. Reatha Armour in [facility] # [phone]  We will send notes. Call and get price quote on both.    Sleep apnea is a sleep disorder characterized by abnormal pauses in breathing while you sleep. When your breathing pauses, the level of oxygen in your blood decreases. This causes you to move out of deep sleep and into light sleep. As a result, your quality of sleep is poor, and the system that carries your blood throughout your body (cardiovascular system) experiences stress. If sleep apnea remains untreated, the following conditions can develop:  High blood pressure (hypertension).  Coronary artery disease.  Inability to achieve or maintain an erection (impotence).  Impairment of your thought process (cognitive dysfunction). There are three types of sleep apnea: 1. Obstructive sleep apnea--Pauses in breathing during sleep because of a blocked airway. 2. Central sleep apnea--Pauses in breathing during sleep because the area of the brain that controls your breathing does not send the correct signals to the muscles that control breathing. 3. Mixed sleep apnea--A combination of both obstructive and central sleep apnea.  RISK FACTORS The following risk factors can increase your risk of developing sleep apnea:  Being overweight.  Smoking.  Having narrow passages in your nose and throat.  Being of older age.  Being male.   Alcohol use.  Sedative and tranquilizer use.  Ethnicity. Among individuals younger than 35 years, African Americans are at increased risk of sleep apnea. SYMPTOMS   Difficulty staying asleep.  Daytime sleepiness and fatigue.  Loss of energy.  Irritability.  Loud, heavy snoring.  Morning headaches.  Trouble concentrating.  Forgetfulness.  Decreased interest in sex. DIAGNOSIS  In order to diagnose sleep apnea, your caregiver will perform a physical examination. Your caregiver may suggest that you take a home sleep test. Your caregiver may also recommend that you spend the night in a sleep lab. In the sleep lab, several monitors record information about your heart, lungs, and brain while you sleep. Your leg and arm movements and blood oxygen level are also recorded. TREATMENT The following actions may help to resolve mild sleep apnea:  Sleeping on your side.   Using a decongestant if you have nasal congestion.   Avoiding the use of depressants, including alcohol, sedatives, and narcotics.   Losing weight and modifying your diet if you are overweight. There also are devices and treatments to help open  your airway:  Oral appliances. These are custom-made mouthpieces that shift your lower jaw forward and slightly open your bite. This opens your airway.  Devices that create positive airway pressure. This positive pressure "splints" your airway open to help you breathe better during sleep. The following devices create positive airway pressure:  Continuous positive airway pressure (CPAP) device. The CPAP device creates a continuous level of air pressure with an air pump. The air is delivered to your airway through a mask while you sleep. This continuous pressure keeps your airway open.  Nasal expiratory positive airway pressure (EPAP) device. The EPAP device creates positive air pressure as you exhale. The device consists of single-use valves, which are inserted into each  nostril and held in place by adhesive. The valves create very little resistance when you inhale but create much more resistance when you exhale. That increased resistance creates the positive airway pressure. This positive pressure while you exhale keeps your airway open, making it easier to breath when you inhale again.  Bilevel positive airway pressure (BPAP) device. The BPAP device is used mainly in patients with central sleep apnea. This device is similar to the CPAP device because it also uses an air pump to deliver continuous air pressure through a mask. However, with the BPAP machine, the pressure is set at two different levels. The pressure when you exhale is lower than the pressure when you inhale.  Surgery. Typically, surgery is only done if you cannot comply with less invasive treatments or if the less invasive treatments do not improve your condition. Surgery involves removing excess tissue in your airway to create a wider passage way. Document Released: 09/06/2002 Document Revised: 01/11/2013 Document Reviewed: 01/23/2012 [facility] Patient Information 2015 [place], [place]. This information is not intended to replace advice given to you by your health care provider. Make sure you discuss any questions you have with your health care provider.

## 2015-03-07 LAB — VITAMIN D 25 HYDROXY (VIT D DEFICIENCY, FRACTURES): VIT D 25 HYDROXY: 70 ng/mL (ref 30–100)

## 2015-04-06 ENCOUNTER — Other Ambulatory Visit: Payer: Self-pay | Admitting: Internal Medicine

## 2015-04-06 ENCOUNTER — Other Ambulatory Visit: Payer: Self-pay

## 2015-04-06 ENCOUNTER — Encounter: Payer: Self-pay | Admitting: Physician Assistant

## 2015-04-06 MED ORDER — NADOLOL 80 MG PO TABS: ORAL_TABLET | ORAL | Status: DC

## 2015-04-06 MED ORDER — ALPRAZOLAM 2 MG PO TABS: 2.0000 mg | ORAL_TABLET | Freq: Every evening | ORAL | Status: DC | PRN

## 2015-06-04 NOTE — Progress Notes (Signed)
Patient ID: Jason Guerrero, male   DOB: 1981/09/12, 34 y.o.   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  Patient has been followed for HTN, Hyperlipidemia, Vitamin D Deficiency and screened proactively for Prediabetes.   Labile HTN and palpitations predates since 2014. Patient's BP & palpitations has been controlled on Naldolol . Today's BP: 122/84 mmHg. Patient denies any cardiac symptoms as chest pain, shortness of breath, dizziness or ankle swelling.   Patient's hyperlipidemia is controlled with diet and medications. Patient denies myalgias or other medication SE's. Last lipids were at goal -  Cholesterol 182; HDL 45; LDL 79;except elevated Triglycerides 292 on 03/06/2015.   Patient is screened proactively for prediabetes  and patient denies reactive hypoglycemic symptoms, visual blurring, diabetic polys or paresthesias. Last A1c was 5.4% on 2 Sept 2015.   Finally, patient has history of Vitamin D Deficiency of    and last vitamin D was 70 on 03/06/2015.     Medication Sig  . alprazolam 2 MG tablet Take 1 tablet (2 mg total) by mouth at bedtime as needed. for sleep  . amitriptyline  50 MG tablet TAKE 1 TABLET BY MOUTH 3 TIMES A DAY FOR CHRONIC NECK PAIN  . VITAMIN D 5000 UNITS TABS Take by mouth daily.  . Magnesium 250 MG TABS Take by mouth daily.  . meloxicam (MOBIC) 15 MG tablet Take once daily as needed daily with food for pain  . nadolol (CORGARD) 80 MG tablet TAKE 1/2 TO 1 TABLET EVERY DAY FOR BLOOD PRESSURE AND HEART PALPITATIONS  . pravastatin (PRAVACHOL) 40 MG tablet TAKE 1 TABLET BY MOUTH AT BEDTIME   Allergies  Allergen Reactions  . Sulfa Antibiotics    Past Medical History  Diagnosis Date  . HLD (hyperlipidemia)   . Anxiety   . Insomnia   . Lumbago   . Tension headache   . Unspecified vitamin D deficiency    Health Maintenance  Topic Date Due  . INFLUENZA VACCINE  05/01/2015  . TETANUS/TDAP  10/22/2023  .  HIV Screening  Completed   Immunization History  Administered Date(s) Administered  . PPD Test 06/01/2014  . Tdap 10/21/2013   No past surgical history on file. Family History  Problem Relation Age of Onset  . Hypertension Mother   . Diabetes Mother   . Heart disease Father   . Diabetes Father   . Colon cancer Maternal Grandfather   . Lung cancer Paternal Grandfather   . Breast cancer Maternal Grandmother   . Ovarian cancer Paternal Grandmother   . Hyperlipidemia Father    Social History   Social History  . Marital Status: Single    Spouse Name: N/A  . Number of Children: N/A  . Years of Education: N/A   Occupational History  . Works at [employer] (3 pm to 1 am)    Social History Main Topics  . Smoking status: Current Every Day Smoker -- 0.75 packs/day    Types: Cigarettes  . Smokeless tobacco: Not on file  . Alcohol Use: 0.0 oz/week    0 Standard drinks or equivalent per week     Comment: 10 beers per week  . Drug Use: No  . Sexual Activity: Not on file    ROS Constitutional: Denies fever, chills, weight loss/gain,  insomnia,  night sweats or change in appetite. Does c/o fatigue. Has occas left frontotemporal HA's. headaches. Eyes: Denies redness, blurred vision, diplopia, discharge, itchy or  watery eyes.  ENT: Denies discharge, congestion, post nasal drip, epistaxis, sore throat, earache, hearing loss, dental pain, Tinnitus, Vertigo, Sinus pain or snoring. Has been told by friends that he grinds his teeth , dentist says no signs on teeth of grinding.  Cardio: Denies chest pain, palpitations, irregular heartbeat, syncope, dyspnea, diaphoresis, orthopnea, PND, claudication or edema Respiratory: denies cough, dyspnea, DOE, pleurisy, hoarseness, laryngitis or wheezing.  Gastrointestinal: Denies dysphagia, heartburn, reflux, water brash, pain, cramps, nausea, vomiting, bloating, diarrhea, constipation, hematemesis, melena, hematochezia, jaundice or  hemorrhoids Genitourinary: Denies dysuria, frequency, urgency, nocturia, hesitancy, discharge, hematuria or flank pain Musculoskeletal: DHas chronic neck & LBP w/o sciatica. C/o tetatarsal area of righ tfoot. . Denies Falls. Skin: Denies puritis, rash, hives, warts, acne, eczema or change in skin lesion Neuro: No weakness, tremor, incoordination, spasms, paresthesia or pain Psychiatric: Denies confusion, memory loss or sensory loss. Denies Depression. Endocrine: Denies change in weight, skin, hair change, nocturia, and paresthesia, diabetic polys, visual blurring or hyper / hypo glycemic episodes.  Heme/Lymph: No excessive bleeding, bruising or enlarged lymph nodes.  Physical Exam  BP 122/84   Pulse 72  Temp 97.9 F   Resp 16  Ht 5' 10.5"   Wt 143 lb      BMI 20.25   General Appearance: Well nourished &  in no apparent distress. Eyes: PERRLA, EOMs, conjunctiva no swelling or erythema, normal fundi and vessels. Sinuses: No frontal/maxillary tenderness ENT/Mouth: EACs patent / TMs  nl. Nares clear without erythema, swelling, mucoid exudates. Oral hygiene is good. No erythema, swelling, or exudate. Tongue normal, non-obstructing. Tonsils not swollen or erythematous. Hearing normal.  Neck: Supple, thyroid normal. No bruits, nodes or JVD. Respiratory: Respiratory effort normal.  BS equal and clear bilateral without rales, rhonci, wheezing or stridor. Cardio: Heart sounds are normal with regular rate and rhythm and no murmurs, rubs or gallops. Peripheral pulses are normal and equal bilaterally without edema. No aortic or femoral bruits. Chest: symmetric with normal excursions and percussion.  Abdomen: Flat, soft, with bowel sounds. Nontender, no guarding, rebound, hernias, masses, or organomegaly.  Lymphatics: Non tender without lymphadenopathy.  Genitourinary: No hernias.Testes nl. DRE - deferred for age. Musculoskeletal: Full ROM all peripheral extremities, joint stability, 5/5 strength,  and normal gait. Skin: Warm and dry without rashes, lesions, cyanosis, clubbing or  ecchymosis.  Neuro: Cranial nerves intact, reflexes equal bilaterally. Normal muscle tone, no cerebellar symptoms. Sensation intact.  Pysch: Alert and oriented X 3 with normal affect, insight and judgment appropriate.   Assessment and Plan  1. Essential hypertension  - Microalbumin / creatinine urine ratio - EKG 12-Lead - TSH  2. Hyperlipidemia  - Lipid panel  3. Abnormal glucose  - Hemoglobin A1c - Insulin, random  4. Vitamin D deficiency  - Vit D  25 hydroxy   5. Tobacco use disorder   6. Screening for rectal cancer  - POC Hemoccult Bld/Stl  7. Other fatigue  - Vitamin B12 - Testosterone - Iron and TIBC - TSH  8. Medication management  - Urinalysis, Routine w reflex microscopic  - CBC with Differential/Platelet - BASIC METABOLIC PANEL WITH GFR - Hepatic function panel - Magnesium  9. Body mass index (BMI) of 20.0-20.9 in adult   Continue prudent diet as discussed, weight control, BP monitoring, regular exercise, and medications as discussed.  Discussed med effects and SE's. Routine screening labs and tests as requested with regular follow-up as recommended.  Over 40 minutes of exam, counseling &  chart review was performed

## 2015-06-04 NOTE — Patient Instructions (Signed)
Recommend Adult Low dose Aspirin or   coated  Aspirin 81 mg daily   To reduce risk of Colon Cancer 20 %,   Skin Cancer 26 % ,   Melanoma 46%   and   Pancreatic cancer 60%  ++++++++++++++++++  Vitamin D goal   is between 70-100.   Please make sure that you are taking your Vitamin D as directed.   It is very important as a natural anti-inflammatory   helping hair, skin, and nails, as well as reducing stroke and heart attack risk.   It helps your bones and helps with mood.  It also decreases numerous cancer risks so please take it as directed.   Low Vit D is associated with a 200-300% higher risk for CANCER   and 200-300% higher risk for HEART   ATTACK  &  STROKE.   .....................................Marland Kitchen  It is also associated with higher death rate at younger ages,   autoimmune diseases like Rheumatoid arthritis, Lupus, Multiple Sclerosis.     Also many other serious conditions, like depression, Alzheimer's  Dementia, infertility, muscle aches, fatigue, fibromyalgia - just to name a few.  +++++++++++++++++++  Recommend the book "The END of DIETING" by Dr Excell Seltzer   & the book "The END of DIABETES " by Dr Excell Seltzer  At [email or web address] - get book & Audio CD's     Being diabetic has a  300% increased risk for heart attack, stroke, cancer, and alzheimer- type vascular dementia. It is very important that you work harder with diet by avoiding all foods that are white. Avoid white rice (brown & wild rice is OK), white potatoes (sweetpotatoes in moderation is OK), White bread or wheat bread or anything made out of white flour like bagels, donuts, rolls, buns, biscuits, cakes, pastries, cookies, pizza crust, and pasta (made from white flour & egg whites) - vegetarian pasta or spinach or wheat pasta is OK. Multigrain breads like Arnold's or [employer], or multigrain sandwich thins or flatbreads.  Diet, exercise and weight loss can reverse and cure diabetes in the early  stages.  Diet, exercise and weight loss is very important in the control and prevention of complications of diabetes which affects every system in your body, ie. Brain - dementia/stroke, eyes - glaucoma/blindness, heart - heart attack/heart failure, kidneys - dialysis, stomach - gastric paralysis, intestines - malabsorption, nerves - severe painful neuritis, circulation - gangrene & loss of a leg(s), and finally cancer and Alzheimers.    I recommend avoid fried & greasy foods,  sweets/candy, white rice (brown or wild rice or Quinoa is OK), white potatoes (sweet potatoes are OK) - anything made from white flour - bagels, doughnuts, rolls, buns, biscuits,white and wheat breads, pizza crust and traditional pasta made of white flour & egg white(vegetarian pasta or spinach or wheat pasta is OK).  Multi-grain bread is OK - like multi-grain flat bread or sandwich thins. Avoid alcohol in excess. Exercise is also important.    Eat all the vegetables you want - avoid meat, especially red meat and dairy - especially cheese.  Cheese is the most concentrated form of trans-fats which is the worst thing to clog up our arteries. Veggie cheese is OK which can be found in the fresh produce section at [facility] or [employer] or [employer]  ++++++++++++++++++++++++++   Preventive Care for Adults  A healthy lifestyle and preventive care can promote health and wellness. Preventive health guidelines for men include the following key practices:  A routine  yearly physical is a good way to check with your health care provider about your health and preventative screening. It is a chance to share any concerns and updates on your health and to receive a thorough exam.  Visit your dentist for a routine exam and preventative care every 6 months. Brush your teeth twice a day and floss once a day. Good oral hygiene prevents tooth decay and gum disease.  The frequency of eye exams is based on your age, health, family medical  history, use of contact lenses, and other factors. Follow your health care provider's recommendations for frequency of eye exams.  Eat a healthy diet. Foods such as vegetables, fruits, whole grains, low-fat dairy products, and lean protein foods contain the nutrients you need without too many calories. Decrease your intake of foods high in solid fats, added sugars, and salt. Eat the right amount of calories for you.Get information about a proper diet from your health care provider, if necessary.  Regular physical exercise is one of the most important things you can do for your health. Most adults should get at least 150 minutes of moderate-intensity exercise (any activity that increases your heart rate and causes you to sweat) each week. In addition, most adults need muscle-strengthening exercises on 2 or more days a week.  Maintain a healthy weight. The body mass index (BMI) is a screening tool to identify possible weight problems. It provides an estimate of body fat based on height and weight. Your health care provider can find your BMI and can help you achieve or maintain a healthy weight.For adults 20 years and older:  A BMI below 18.5 is considered underweight.  A BMI of 18.5 to 24.9 is normal.  A BMI of 25 to 29.9 is considered overweight.  A BMI of 30 and above is considered obese.  Maintain normal blood lipids and cholesterol levels by exercising and minimizing your intake of saturated fat. Eat a balanced diet with plenty of fruit and vegetables. Blood tests for lipids and cholesterol should begin at age 33 and be repeated every 5 years. If your lipid or cholesterol levels are high, you are over 50, or you are at high risk for heart disease, you may need your cholesterol levels checked more frequently.Ongoing high lipid and cholesterol levels should be treated with medicines if diet and exercise are not working.  If you smoke, find out from your health care provider how to quit. If you  do not use tobacco, do not start.  Lung cancer screening is recommended for adults aged 69-80 years who are at high risk for developing lung cancer because of a history of smoking. A yearly low-dose CT scan of the lungs is recommended for people who have at least a 30-pack-year history of smoking and are a current smoker or have quit within the past 15 years. A pack year of smoking is smoking an average of 1 pack of cigarettes a day for 1 year (for example: 1 pack a day for 30 years or 2 packs a day for 15 years). Yearly screening should continue until the smoker has stopped smoking for at least 15 years. Yearly screening should be stopped for people who develop a health problem that would prevent them from having lung cancer treatment.  If you choose to drink alcohol, do not have more than 2 drinks per day. One drink is considered to be 12 ounces (355 mL) of beer, 5 ounces (148 mL) of wine, or 1.5 ounces (44 mL)  of liquor.  High blood pressure causes heart disease and increases the risk of stroke. Your blood pressure should be checked. Ongoing high blood pressure should be treated with medicines, if weight loss and exercise are not effective.  If you are 84-47 years old, ask your health care provider if you should take aspirin to prevent heart disease.  Diabetes screening involves taking a blood sample to check your fasting blood sugar level. Testing should be considered at a younger age or be carried out more frequently if you are overweight and have at least 1 risk factor for diabetes.  Colorectal cancer can be detected and often prevented. Most routine colorectal cancer screening begins at the age of 82 and continues through age 44. However, your health care provider may recommend screening at an earlier age if you have risk factors for colon cancer. On a yearly basis, your health care provider may provide home test kits to check for hidden blood in the stool. Use of a small camera at the end of a  tube to directly examine the colon (sigmoidoscopy or colonoscopy) can detect the earliest forms of colorectal cancer. Talk to your health care provider about this at age 62, when routine screening begins. Direct exam of the colon should be repeated every 5-10 years through age 61, unless early forms of precancerous polyps or small growths are found.  Screening for abdominal aortic aneurysm (AAA)  are recommended for persons over age 40 who have history of hypertensionor who are current or former smokers.  Talk with your health care provider about prostate cancer screening.  Testicular cancer screening is recommended for adult males. Screening includes self-exam, a health care provider exam, and other screening tests. Consult with your health care provider about any symptoms you have or any concerns you have about testicular cancer.  Use sunscreen. Apply sunscreen liberally and repeatedly throughout the day. You should seek shade when your shadow is shorter than you. Protect yourself by wearing long sleeves, pants, a wide-brimmed hat, and sunglasses year round, whenever you are outdoors.  Once a month, do a whole-body skin exam, using a mirror to look at the skin on your back. Tell your health care provider about new moles, moles that have irregular borders, moles that are larger than a pencil eraser, or moles that have changed in shape or color.  Stay current with required vaccines (immunizations).  Influenza vaccine. All adults should be immunized every year.  Tetanus, diphtheria, and acellular pertussis (Td, Tdap) vaccine. An adult who has not previously received Tdap or who does not know his vaccine status should receive 1 dose of Tdap. This initial dose should be followed by tetanus and diphtheria toxoids (Td) booster doses every 10 years. Adults with an unknown or incomplete history of completing a 3-dose immunization series with Td-containing vaccines should begin or complete a primary  immunization series including a Tdap dose. Adults should receive a Td booster every 10 years.  Zoster vaccine. One dose is recommended for adults aged 64 years or older unless certain conditions are present.    Pneumococcal 13-valent conjugate (PCV13) vaccine. When indicated, a person who is uncertain of his immunization history and has no record of immunization should receive the PCV13 vaccine. An adult aged 1 years or older who has certain medical conditions and has not been previously immunized should receive 1 dose of PCV13 vaccine. This PCV13 should be followed with a dose of pneumococcal polysaccharide (PPSV23) vaccine. The PPSV23 vaccine dose should be obtained at  least 8 weeks after the dose of PCV13 vaccine. An adult aged 101 years or older who has certain medical conditions and previously received 1 or more doses of PPSV23 vaccine should receive 1 dose of PCV13. The PCV13 vaccine dose should be obtained 1 or more years after the last PPSV23 vaccine dose.    Pneumococcal polysaccharide (PPSV23) vaccine. When PCV13 is also indicated, PCV13 should be obtained first. All adults aged 12 years and older should be immunized. An adult younger than age 39 years who has certain medical conditions should be immunized. Any person who resides in a nursing home or long-term care facility should be immunized. An adult smoker should be immunized. People with an immunocompromised condition and certain other conditions should receive both PCV13 and PPSV23 vaccines. People with human immunodeficiency virus (HIV) infection should be immunized as soon as possible after diagnosis. Immunization during chemotherapy or radiation therapy should be avoided. Routine use of PPSV23 vaccine is not recommended for American Indians, [place] Natives, or people younger than 65 years unless there are medical conditions that require PPSV23 vaccine. When indicated, people who have unknown immunization and have no record of  immunization should receive PPSV23 vaccine. One-time revaccination 5 years after the first dose of PPSV23 is recommended for people aged 19-64 years who have chronic kidney failure, nephrotic syndrome, asplenia, or immunocompromised conditions. People who received 1-2 doses of PPSV23 before age 61 years should receive another dose of PPSV23 vaccine at age 8 years or later if at least 5 years have passed since the previous dose. Doses of PPSV23 are not needed for people immunized with PPSV23 at or after age 3 years.  Hepatitis A vaccine. Adults who wish to be protected from this disease, have certain high-risk conditions, work with hepatitis A-infected animals, work in hepatitis A research labs, or travel to or work in countries with a high rate of hepatitis A should be immunized. Adults who were previously unvaccinated and who anticipate close contact with an international adoptee during the first 60 days after arrival in the [place] from a country with a high rate of hepatitis A should be immunized.  Hepatitis B vaccine. Adults should be immunized if they wish to be protected from this disease, have certain high-risk conditions, may be exposed to blood or other infectious body fluids, are household contacts or sex partners of hepatitis B positive people, are clients or workers in certain care facilities, or travel to or work in countries with a high rate of hepatitis B.  Preventive Service / Frequency  Ages 56 to 20  Blood pressure check.  Lipid and cholesterol check.  Hepatitis C blood test.** / For any individual with known risks for hepatitis C.  Skin self-exam. / Monthly.  Influenza vaccine. / Every year.  Tetanus, diphtheria, and acellular pertussis (Tdap, Td) vaccine.** / Consult your health care provider. 1 dose of Td every 10 years.  HPV vaccine. / 3 doses over 6 months, if 34 or younger.  Measles, mumps, rubella (MMR) vaccine.** / You need at least 1 dose of MMR if you were  born in 1957 or later. You may also need a second dose.  Pneumococcal 13-valent conjugate (PCV13) vaccine.** / Consult your health care provider.  Pneumococcal polysaccharide (PPSV23) vaccine.** / 1 to 2 doses if you smoke cigarettes or if you have certain conditions.  Meningococcal vaccine.** / 1 dose if you are age 19 to 53 years and a Market researcher living in a residence hall, or have  one of several medical conditions. You may also need additional booster doses.  Hepatitis A vaccine.** / Consult your health care provider.  Hepatitis B vaccine.** / Consult your health care provider.

## 2015-06-06 ENCOUNTER — Ambulatory Visit (INDEPENDENT_AMBULATORY_CARE_PROVIDER_SITE_OTHER): Payer: Commercial Managed Care - PPO | Admitting: Internal Medicine

## 2015-06-06 ENCOUNTER — Encounter: Payer: Self-pay | Admitting: Internal Medicine

## 2015-06-06 VITALS — BP 122/84 | HR 72 | Temp 97.9°F | Resp 16 | Ht 70.5 in | Wt 143.2 lb

## 2015-06-06 DIAGNOSIS — Z0000 Encounter for general adult medical examination without abnormal findings: Secondary | ICD-10-CM

## 2015-06-06 DIAGNOSIS — R7309 Other abnormal glucose: Secondary | ICD-10-CM

## 2015-06-06 DIAGNOSIS — R5383 Other fatigue: Secondary | ICD-10-CM

## 2015-06-06 DIAGNOSIS — Z111 Encounter for screening for respiratory tuberculosis: Secondary | ICD-10-CM

## 2015-06-06 DIAGNOSIS — E785 Hyperlipidemia, unspecified: Secondary | ICD-10-CM

## 2015-06-06 DIAGNOSIS — Z79899 Other long term (current) drug therapy: Secondary | ICD-10-CM

## 2015-06-06 DIAGNOSIS — F17200 Nicotine dependence, unspecified, uncomplicated: Secondary | ICD-10-CM

## 2015-06-06 DIAGNOSIS — E559 Vitamin D deficiency, unspecified: Secondary | ICD-10-CM

## 2015-06-06 DIAGNOSIS — I10 Essential (primary) hypertension: Secondary | ICD-10-CM | POA: Diagnosis not present

## 2015-06-06 DIAGNOSIS — Z6820 Body mass index (BMI) 20.0-20.9, adult: Secondary | ICD-10-CM

## 2015-06-06 DIAGNOSIS — Z1212 Encounter for screening for malignant neoplasm of rectum: Secondary | ICD-10-CM

## 2015-06-06 LAB — CBC WITH DIFFERENTIAL/PLATELET
BASOS ABS: 0.1 10*3/uL (ref 0.0–0.1)
BASOS PCT: 1 % (ref 0–1)
EOS ABS: 0.4 10*3/uL (ref 0.0–0.7)
EOS PCT: 5 % (ref 0–5)
HCT: 44.4 % (ref 39.0–52.0)
Hemoglobin: 15.8 g/dL (ref 13.0–17.0)
Lymphocytes Relative: 48 % — ABNORMAL HIGH (ref 12–46)
Lymphs Abs: 3.8 10*3/uL (ref 0.7–4.0)
MCH: 32 pg (ref 26.0–34.0)
MCHC: 35.6 g/dL (ref 30.0–36.0)
MCV: 89.9 fL (ref 78.0–100.0)
MPV: 8.9 fL (ref 8.6–12.4)
Monocytes Absolute: 0.6 10*3/uL (ref 0.1–1.0)
Monocytes Relative: 8 % (ref 3–12)
NEUTROS PCT: 38 % — AB (ref 43–77)
Neutro Abs: 3 10*3/uL (ref 1.7–7.7)
PLATELETS: 184 10*3/uL (ref 150–400)
RBC: 4.94 MIL/uL (ref 4.22–5.81)
RDW: 13.4 % (ref 11.5–15.5)
WBC: 8 10*3/uL (ref 4.0–10.5)

## 2015-06-06 LAB — HEPATIC FUNCTION PANEL
ALT: 14 U/L (ref 9–46)
AST: 15 U/L (ref 10–40)
Albumin: 4 g/dL (ref 3.6–5.1)
Alkaline Phosphatase: 64 U/L (ref 40–115)
BILIRUBIN DIRECT: 0.1 mg/dL (ref <=0.2)
Indirect Bilirubin: 0.3 mg/dL (ref 0.2–1.2)
TOTAL PROTEIN: 6.8 g/dL (ref 6.1–8.1)
Total Bilirubin: 0.4 mg/dL (ref 0.2–1.2)

## 2015-06-06 LAB — TSH: TSH: 3.471 u[IU]/mL (ref 0.350–4.500)

## 2015-06-06 LAB — BASIC METABOLIC PANEL WITH GFR
BUN: 13 mg/dL (ref 7–25)
CHLORIDE: 105 mmol/L (ref 98–110)
CO2: 27 mmol/L (ref 20–31)
Calcium: 9.4 mg/dL (ref 8.6–10.3)
Creat: 0.88 mg/dL (ref 0.60–1.35)
GFR, Est African American: >89 mL/min (ref >=60)
GFR, Est Non African American: >89 mL/min (ref >=60)
Glucose, Bld: 83 mg/dL (ref 65–99)
POTASSIUM: 3.6 mmol/L (ref 3.5–5.3)
SODIUM: 136 mmol/L (ref 135–146)

## 2015-06-06 LAB — LIPID PANEL
CHOL/HDL RATIO: 4.7 ratio (ref <=5.0)
CHOLESTEROL: 177 mg/dL (ref 125–200)
HDL: 38 mg/dL — ABNORMAL LOW (ref >=40)
LDL Cholesterol: 70 mg/dL (ref <130)
Triglycerides: 344 mg/dL — ABNORMAL HIGH (ref <150)
VLDL: 69 mg/dL — ABNORMAL HIGH (ref <30)

## 2015-06-06 LAB — MAGNESIUM: MAGNESIUM: 2 mg/dL (ref 1.5–2.5)

## 2015-06-06 LAB — IRON AND TIBC
%SAT: 21 % (ref 15–60)
Iron: 52 ug/dL (ref 50–180)
TIBC: 252 ug/dL (ref 250–425)
UIBC: 200 ug/dL (ref 125–400)

## 2015-06-06 LAB — HEMOGLOBIN A1C
HEMOGLOBIN A1C: 5.2 % (ref <5.7)
MEAN PLASMA GLUCOSE: 103 mg/dL (ref <117)

## 2015-06-06 LAB — VITAMIN B12: VITAMIN B 12: 458 pg/mL (ref 211–911)

## 2015-06-06 NOTE — Addendum Note (Signed)
Addended by: Valrie Hart C on: 06/06/2015 11:24 AM   Modules accepted: Orders

## 2015-06-07 LAB — INSULIN, RANDOM: INSULIN: 6.5 u[IU]/mL (ref 2.0–19.6)

## 2015-06-07 LAB — URINALYSIS, ROUTINE W REFLEX MICROSCOPIC
BILIRUBIN URINE: NEGATIVE
GLUCOSE, UA: NEGATIVE
HGB URINE DIPSTICK: NEGATIVE
KETONES UR: NEGATIVE
Leukocytes, UA: NEGATIVE
Nitrite: NEGATIVE
PROTEIN: NEGATIVE
Specific Gravity, Urine: 1.011 (ref 1.001–1.035)
pH: 6 (ref 5.0–8.0)

## 2015-06-07 LAB — TESTOSTERONE: Testosterone: 505 ng/dL (ref 300–890)

## 2015-06-07 LAB — VITAMIN D 25 HYDROXY (VIT D DEFICIENCY, FRACTURES): VIT D 25 HYDROXY: 52 ng/mL (ref 30–100)

## 2015-06-07 LAB — MICROALBUMIN / CREATININE URINE RATIO
Creatinine, Urine: 83.8 mg/dL
MICROALB UR: 0.4 mg/dL (ref <2.0)
Microalb Creat Ratio: 4.8 mg/g (ref 0.0–30.0)

## 2015-07-05 ENCOUNTER — Other Ambulatory Visit: Payer: Self-pay | Admitting: Internal Medicine

## 2015-08-08 ENCOUNTER — Other Ambulatory Visit: Payer: Self-pay | Admitting: Physician Assistant

## 2015-08-09 ENCOUNTER — Other Ambulatory Visit: Payer: Self-pay | Admitting: Physician Assistant

## 2015-08-09 DIAGNOSIS — G4700 Insomnia, unspecified: Secondary | ICD-10-CM

## 2015-08-11 ENCOUNTER — Encounter: Payer: Self-pay | Admitting: Physician Assistant

## 2015-09-07 ENCOUNTER — Ambulatory Visit (INDEPENDENT_AMBULATORY_CARE_PROVIDER_SITE_OTHER): Payer: Commercial Managed Care - PPO | Admitting: Internal Medicine

## 2015-09-07 VITALS — BP 128/82 | HR 84 | Temp 97.7°F | Resp 16 | Ht 70.5 in | Wt 145.0 lb

## 2015-09-07 DIAGNOSIS — I10 Essential (primary) hypertension: Secondary | ICD-10-CM

## 2015-09-07 DIAGNOSIS — Z23 Encounter for immunization: Secondary | ICD-10-CM | POA: Diagnosis not present

## 2015-09-07 DIAGNOSIS — F513 Sleepwalking [somnambulism]: Secondary | ICD-10-CM | POA: Diagnosis not present

## 2015-09-07 DIAGNOSIS — Z79899 Other long term (current) drug therapy: Secondary | ICD-10-CM

## 2015-09-07 DIAGNOSIS — G8929 Other chronic pain: Secondary | ICD-10-CM

## 2015-09-07 DIAGNOSIS — M542 Cervicalgia: Secondary | ICD-10-CM

## 2015-09-07 DIAGNOSIS — E559 Vitamin D deficiency, unspecified: Secondary | ICD-10-CM

## 2015-09-07 DIAGNOSIS — E785 Hyperlipidemia, unspecified: Secondary | ICD-10-CM | POA: Diagnosis not present

## 2015-09-07 DIAGNOSIS — R002 Palpitations: Secondary | ICD-10-CM

## 2015-09-07 LAB — BASIC METABOLIC PANEL WITH GFR
BUN: 16 mg/dL (ref 7–25)
CALCIUM: 9.4 mg/dL (ref 8.6–10.3)
CHLORIDE: 104 mmol/L (ref 98–110)
CO2: 26 mmol/L (ref 20–31)
CREATININE: 0.91 mg/dL (ref 0.60–1.35)
GFR, Est African American: >89 mL/min (ref >=60)
GFR, Est Non African American: >89 mL/min (ref >=60)
GLUCOSE: 73 mg/dL (ref 65–99)
Potassium: 4 mmol/L (ref 3.5–5.3)
SODIUM: 139 mmol/L (ref 135–146)

## 2015-09-07 LAB — CBC WITH DIFFERENTIAL/PLATELET
BASOS PCT: 1 % (ref 0–1)
Basophils Absolute: 0.1 10*3/uL (ref 0.0–0.1)
Eosinophils Absolute: 0.3 10*3/uL (ref 0.0–0.7)
Eosinophils Relative: 5 % (ref 0–5)
HCT: 45.9 % (ref 39.0–52.0)
HEMOGLOBIN: 15.8 g/dL (ref 13.0–17.0)
LYMPHS ABS: 2.5 10*3/uL (ref 0.7–4.0)
Lymphocytes Relative: 39 % (ref 12–46)
MCH: 30.9 pg (ref 26.0–34.0)
MCHC: 34.4 g/dL (ref 30.0–36.0)
MCV: 89.8 fL (ref 78.0–100.0)
MONO ABS: 0.6 10*3/uL (ref 0.1–1.0)
MONOS PCT: 9 % (ref 3–12)
MPV: 9.1 fL (ref 8.6–12.4)
NEUTROS ABS: 3 10*3/uL (ref 1.7–7.7)
NEUTROS PCT: 46 % (ref 43–77)
Platelets: 187 10*3/uL (ref 150–400)
RBC: 5.11 MIL/uL (ref 4.22–5.81)
RDW: 13.2 % (ref 11.5–15.5)
WBC: 6.5 10*3/uL (ref 4.0–10.5)

## 2015-09-07 LAB — HEPATIC FUNCTION PANEL
ALBUMIN: 4.1 g/dL (ref 3.6–5.1)
ALT: 17 U/L (ref 9–46)
AST: 16 U/L (ref 10–40)
Alkaline Phosphatase: 45 U/L (ref 40–115)
Bilirubin, Direct: 0.1 mg/dL (ref <=0.2)
Indirect Bilirubin: 0.4 mg/dL (ref 0.2–1.2)
TOTAL PROTEIN: 7 g/dL (ref 6.1–8.1)
Total Bilirubin: 0.5 mg/dL (ref 0.2–1.2)

## 2015-09-07 LAB — TSH: TSH: 2.251 u[IU]/mL (ref 0.350–4.500)

## 2015-09-07 LAB — LIPID PANEL
CHOLESTEROL: 196 mg/dL (ref 125–200)
HDL: 38 mg/dL — ABNORMAL LOW (ref >=40)
TRIGLYCERIDES: 537 mg/dL — AB (ref <150)
Total CHOL/HDL Ratio: 5.2 Ratio — ABNORMAL HIGH (ref <=5.0)

## 2015-09-07 NOTE — Addendum Note (Signed)
Addended by: Joya MartyrZMENT, Lemya Greenwell M on: 09/07/2015 12:24 PM   Modules accepted: Orders

## 2015-09-07 NOTE — Progress Notes (Signed)
Patient ID: Jason Guerrero, male   DOB: [birth year], 34 y.o.   MRN: [id number]  Assessment and Plan:  Hypertension:  -Continue medication,  -monitor blood pressure at home.  -Continue DASH diet.   -Reminder to go to the ER if any CP, SOB, nausea, dizziness, severe HA, changes vision/speech, left arm numbness and tingling, and jaw pain.  Cholesterol: -Continue diet and exercise.  -Check cholesterol.   Pre-diabetes: -Continue diet and exercise.  -Check A1C  Vitamin D Def: -check level -continue medications.   Chronic neck pain -referral to ortho -may need PT for muscle spasms based on history -recommended starting mobic with meals  Sleep walking and fatigue -apnea link Continue diet and meds as discussed. Further disposition pending results of labs.  HPI 34 y.o. male  presents for 3 month follow up with hypertension, hyperlipidemia, prediabetes and vitamin D.   His blood pressure has been controlled at home, today their BP is BP: 128/82 mmHg.   He does workout. He denies chest pain, shortness of breath, dizziness.   He is on cholesterol medication and denies myalgias. His cholesterol is at goal. The cholesterol last visit was:   Lab Results  Component Value Date   CHOL 177 06/06/2015   HDL 38* 06/06/2015   LDLCALC 70 06/06/2015   TRIG 344* 06/06/2015   CHOLHDL 4.7 06/06/2015     He has been working on diet and exercise for prediabetes, and denies foot ulcerations, hyperglycemia, hypoglycemia , increased appetite, nausea, paresthesia of the feet, polydipsia, polyuria, visual disturbances, vomiting and weight loss. Last A1C in the office was:  Lab Results  Component Value Date   HGBA1C 5.2 06/06/2015    Patient is on Vitamin D supplement.  Lab Results  Component Value Date   VD25OH 35 06/06/2015      He reports that he has been sleep walking at night time.  He reports that he has done this in the past despite taking medications.  He reports that he does have a couple  beers before going to bed.  He reports that even though he has been sleeping well he is still very tired the next day.   He did sleep walk as a kid.     He reports that he was not taking the meloxicam or the mobic currently.  He reports that he was afraid to take it because of the cardiac risk factors.  He does have chronic neck and back pain and he has never seen ortho for this.  He would like to go.    Current Medications:  Current Outpatient Prescriptions on File Prior to Visit  Medication Sig Dispense Refill  . alprazolam (XANAX) 2 MG tablet Take 1/2 to 1 tablet before hour of sleep as needed 30 tablet 2  . amitriptyline (ELAVIL) 50 MG tablet TAKE 1 TABLET BY MOUTH 3 TIMES A DAY FOR CHRONIC NECK PAIN 270 tablet 1  . Cholecalciferol (VITAMIN D-3) 5000 UNITS TABS Take by mouth daily.    . Magnesium 250 MG TABS Take by mouth daily.    . nadolol (CORGARD) 80 MG tablet TAKE 1/2 TO 1 TABLET EVERY DAY FOR BLOOD PRESSURE AND HEART PALPITATIONS 90 tablet 3  . Omega-3 Fatty Acids (FISH OIL PO) Take 300 mg by mouth daily.    . pravastatin (PRAVACHOL) 40 MG tablet TAKE 1 TABLET BY MOUTH AT BEDTIME (INS PAYS FOR 30 DAYS) 90 tablet 1   No current facility-administered medications on file prior to visit.    Medical  History:  Past Medical History  Diagnosis Date  . HLD (hyperlipidemia)   . Anxiety   . Insomnia   . Lumbago   . Tension headache   . Unspecified vitamin D deficiency     Allergies:  Allergies  Allergen Reactions  . Sulfa Antibiotics      Review of Systems:  Review of Systems  Constitutional: Negative for fever, chills and malaise/fatigue.  HENT: Negative for congestion, ear pain and sore throat.   Respiratory: Negative for cough, shortness of breath and wheezing.   Cardiovascular: Positive for palpitations. Negative for chest pain and leg swelling.  Gastrointestinal: Negative for heartburn, diarrhea, constipation, blood in stool and melena.  Genitourinary: Negative.    Skin: Negative.   Neurological: Positive for sensory change (tingling in left arm). Negative for dizziness, loss of consciousness and headaches.  Psychiatric/Behavioral: Negative for depression. The patient has insomnia. The patient is not nervous/anxious.     Family history- Review and unchanged  Social history- Review and unchanged  Physical Exam: BP 128/82 mmHg  Pulse 84  Temp(Src) 97.7 F (36.5 C)  Resp 16  Ht 5' 10.5" (1.791 m)  Wt 145 lb (65.772 kg)  BMI 20.50 kg/m2 Wt Readings from Last 3 Encounters:  09/07/15 145 lb (65.772 kg)  06/06/15 143 lb 3.2 oz (64.955 kg)  03/06/15 140 lb (63.504 kg)    General Appearance: Well nourished well developed, in no apparent distress. Eyes: PERRLA, EOMs, conjunctiva no swelling or erythema ENT/Mouth: Ear canals normal without obstruction, swelling, erythma, discharge.  TMs normal bilaterally.  Oropharynx moist, clear, without exudate, or postoropharyngeal swelling.  Neck: Supple, thyroid normal,no cervical adenopathy  Respiratory: Respiratory effort normal, Breath sounds clear A&P without rhonchi, wheeze, or rale.  No retractions, no accessory usage. Cardio: RRR with no MRGs. Brisk peripheral pulses without edema.  Abdomen: Soft, + BS,  Non tender, no guarding, rebound, hernias, masses. Musculoskeletal: Full ROM, 5/5 strength, Normal gait Skin: Warm, dry without rashes, lesions, ecchymosis.  Neuro: Awake and oriented X 3, Cranial nerves intact. Normal muscle tone, no cerebellar symptoms. Psych: Normal affect, Insight and Judgment appropriate.    Terri Piedraourtney Forcucci, PA-C 11:56 AM [facility]

## 2015-09-08 ENCOUNTER — Other Ambulatory Visit: Payer: Self-pay | Admitting: Internal Medicine

## 2015-09-08 MED ORDER — FENOFIBRATE 160 MG PO TABS: 160.0000 mg | ORAL_TABLET | Freq: Every day | ORAL | Status: DC

## 2015-09-20 ENCOUNTER — Telehealth: Payer: Self-pay | Admitting: Cardiology

## 2015-09-20 NOTE — Telephone Encounter (Signed)
Called pt and left message informing pt that we needed to update fm and medical hx and if he could call back with that information.

## 2015-09-27 ENCOUNTER — Encounter: Payer: Self-pay | Admitting: Neurology

## 2015-09-27 ENCOUNTER — Ambulatory Visit (INDEPENDENT_AMBULATORY_CARE_PROVIDER_SITE_OTHER): Payer: Commercial Managed Care - PPO | Admitting: Neurology

## 2015-09-27 VITALS — BP 112/82 | HR 84 | Resp 20 | Ht 70.0 in | Wt 141.0 lb

## 2015-09-27 DIAGNOSIS — F513 Sleepwalking [somnambulism]: Secondary | ICD-10-CM | POA: Diagnosis not present

## 2015-09-27 DIAGNOSIS — G4726 Circadian rhythm sleep disorder, shift work type: Secondary | ICD-10-CM

## 2015-09-27 DIAGNOSIS — G478 Other sleep disorders: Secondary | ICD-10-CM

## 2015-09-27 DIAGNOSIS — R0683 Snoring: Secondary | ICD-10-CM | POA: Diagnosis not present

## 2015-09-27 DIAGNOSIS — G4713 Recurrent hypersomnia: Secondary | ICD-10-CM

## 2015-09-27 NOTE — Patient Instructions (Signed)

## 2015-09-27 NOTE — Progress Notes (Signed)
SLEEP MEDICINE CLINIC   Provider:  Melvyn Novas, M D  Referring Provider: Lucky Cowboy, MD Primary Care Physician:  Nadean Corwin, MD  Chief Complaint  Patient presents with  . New Patient (Initial Visit)    sleep walking and fatigue, has never had sleep study, rm 10, alone    HPI:  MACAULEY MOSSBERG is a 34 y.o. male , seen here as a referral from Dr. Oneta Rack for sleep walking/ parasomnia.    Mr. Laraia reports that sleep walking begun probably around age 64 at least it was the first time noted when he woke up his mother during a sleep walking episodes. Sleep walking is more common now as an adult if he had alcohol or if he is sleep deprived. He usually tries to go out side during a sleepwalking episode and smokes. 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 The change in work and sleep hours did not affect the frequency of his sleep walking and he reports that he still wakes up every couple of hours. He has been placed on a benzodiazepine which may have helped to suppress some of the sleep activity.   Sleep habits are as follows: The patient usually goes to his bedroom at around 3:57 AM in the morning. His bedroom is call and quiet but is not necessarily protected from day light. He likes to fall asleep with the TV in the background but it is set to a sleep timer. He sleeps through when the TV  goes off. Before he was started on Xanax he would wake up every 2 hours also and often 3 times at night. There was no other trigger it was not because he had to go to the bathroom or because he was in pain. This alprazolam now he sleeps more likely through the night waking up once, and he rises usually at 11 AM. He has reduced his alcohol intake because it has affected his sleepwalking tendency negatively he would then also sleep longer until 1 or 2 PM. He prefers the lateral sleep position, and he wakes usually still  on his side. Typically he does not wake up with headaches. Overall sleep time is about 6 hours. He is unable to nap in daytime due to his workload. He often works through weekends.   Mr. Drumwright is a slender 34 year old gentleman who has a history of hypertension, hyperlipidemia- hyper triglyceridemia and was diagnosed with familiar history of high triglycerides high cholesterol and heart disease. He has been placed on Corgard for tachycardia which has reduced his heart rate. He is also supplementing vitamin D he has been taking Elavil for neck pain, Xanax for sleep aids, and Pravachol to control his cholesterol.  Sleep medical history and family sleep history:  Parents were nor  sleepwalking, father has OSA.    Social history:  Single , girl friend . Smoker 1ppd,  ETOH drinks beer, 3-6 times  a week  Review of Systems: Out of a complete 14 system review, the patient complains of only the following symptoms, and all other reviewed systems are negative.  Snoring, fatigue. Shift worker.  Epworth score 7 , Fatigue severity score 39 , depression score 0   Social History   Social History  . Marital Status: Single    Spouse Name: N/A  . Number of Children: N/A  . Years of Education: N/A   Occupational History  . Not on  file.   Social History Main Topics  . Smoking status: Current Every Day Smoker -- 0.75 packs/day    Types: Cigarettes  . Smokeless tobacco: Not on file  . Alcohol Use: 0.0 oz/week    0 Standard drinks or equivalent per week     Comment: 10 beers per week  . Drug Use: No  . Sexual Activity: Not on file   Other Topics Concern  . Not on file   Social History Narrative    Family History  Problem Relation Age of Onset  . Hypertension Mother   . Diabetes Mother   . Heart disease Father   . Diabetes Father   . Colon cancer Maternal Grandfather   . Lung cancer Paternal Grandfather   . Breast cancer Maternal Grandmother   . Ovarian cancer Paternal Grandmother     . Hyperlipidemia Father     Past Medical History  Diagnosis Date  . HLD (hyperlipidemia)   . Anxiety   . Insomnia   . Lumbago   . Tension headache   . Unspecified vitamin D deficiency     History reviewed. No pertinent past surgical history.  Current Outpatient Prescriptions  Medication Sig Dispense Refill  . alprazolam (XANAX) 2 MG tablet Take 1/2 to 1 tablet before hour of sleep as needed 30 tablet 2  . amitriptyline (ELAVIL) 50 MG tablet TAKE 1 TABLET BY MOUTH 3 TIMES A DAY FOR CHRONIC NECK PAIN 270 tablet 1  . Cholecalciferol (VITAMIN D-3) 5000 UNITS TABS Take by mouth daily.    . fenofibrate 160 MG tablet Take 1 tablet (160 mg total) by mouth daily. 90 tablet 0  . Magnesium 250 MG TABS Take by mouth daily.    . nadolol (CORGARD) 80 MG tablet TAKE 1/2 TO 1 TABLET EVERY DAY FOR BLOOD PRESSURE AND HEART PALPITATIONS 90 tablet 3  . Omega-3 Fatty Acids (FISH OIL PO) Take 300 mg by mouth daily.    . pravastatin (PRAVACHOL) 40 MG tablet TAKE 1 TABLET BY MOUTH AT BEDTIME (INS PAYS FOR 30 DAYS) 90 tablet 1   No current facility-administered medications for this visit.    Allergies as of 09/27/2015 - Review Complete 09/27/2015  Allergen Reaction Noted  . Sulfa antibiotics  06/23/2013    Vitals: BP 112/82 mmHg  Pulse 84  Resp 20  Ht  (1.778 m)  Wt 141 lb (63.957 kg)  BMI 20.23 kg/m2 Last Weight:  Wt Readings from Last 1 Encounters:  09/27/15 141 lb (63.957 kg)   ZOX:WRUE mass index is 20.23 kg/(m^2).     Last Height:   Ht Readings from Last 1 Encounters:  09/27/15  (1.778 m)    Physical exam:  General: The patient is awake, alert and appears not in acute distress. The patient is well groomed. Head: Normocephalic, atraumatic. Neck is supple. Mallampati 3   neck circumference:15. Nasal airflow unrestricted , TMJnot evident . Retrognathia is seen.  Cardiovascular:  Regular rate and rhythm, without  murmurs or carotid bruit, and without distended neck  veins. Respiratory: Lungs are clear to auscultation. Skin:  Without evidence of edema, or rash Trunk: slender   Neurologic exam : The patient is awake and alert, oriented to place and time.   Memory subjective described as intact.  Attention span & concentration ability appears normal.  Speech is fluent,  without  dysarthria, dysphonia or aphasia.  Mood and affect are appropriate.  Cranial nerves: Pupils are equal and briskly reactive to light. Funduscopic exam without  evidence of pallor or edema. Extraocular movements  in vertical and horizontal planes intact and without nystagmus. Visual fields by finger perimetry are intact. Hearing to finger rub intact.   Facial sensation intact to fine touch.  Facial motor strength is symmetric and tongue and uvula move midline. Shoulder shrug was symmetrical.   Motor exam: Normal tone, muscle bulk and symmetric strength in all extremities.  Sensory:  Fine touch, pinprick and vibration were tested in all extremities.  Proprioception tested in the upper extremities was normal.  Coordination: Rapid alternating movements in the fingers/hands was normal. Finger-to-nose maneuver  normal without evidence of ataxia, dysmetria or tremor.  Gait and station: Patient walks without assistive device and is able unassisted to climb up to the exam table. Strength within normal limits.Stance is stable and normal.   Deep tendon reflexes: in the  upper and lower extremities are symmetric and intact. Very brisk.  Babinski maneuver response is downgoing.  The patient was advised of the nature of the diagnosed sleep disorder, the treatment options and risks for general a health and wellness arising from not treating the condition.  I spent more than 35 minutes of face to face time with the patient. Greater than 50% of time was spent in counseling and coordination of care. We have discussed the diagnosis and differential and I answered the patient's questions.      Assessment:  After physical and neurologic examination, review of laboratory studies,  Personal review of imaging studies, reports of other /same  Imaging studies ,  Results of polysomnography/ neurophysiology testing and pre-existing records as far as provided in visit., my assessment is   1) Mr. Maisie Fushomas describes sleep walking episodes since age 34. Over the last 2 weeks they have reduced in frequency and he has noted this because he has started to reduce his alcohol intake. Sleep deprivation, alcohol and some medications can promote the onset of sleep walking. Sleepwalking that starts in childhood is usually associated with non REM -slow wave sleep. The easiest and side effect free treatment would be to assure that Mr. Maisie Fushomas gets 5-6 hours of sleep at night he may be able to take melatonin to allow him to sleep through the night and I would certainly allow him to continue taking alprazolam. The alcohol reduction and think is key.   2) Elavil can continue to be taken for neck pain and also as a sleep aid.  The patient should not take trazodone can cause nightmares, increase the sleepwalking episodes, it also causes drooling and at times enuresis.   Plan:  Treatment plan and additional workup :  Parasomnia montage PSG with snoring , checking for apnea.  Shift work sleep accommodation.    Porfirio Mylararmen Maricsa Sammons MD  09/27/2015   CC: Lucky CowboyWilliam Mckeown, Md [address] [place], [place] [id number]

## 2015-09-28 ENCOUNTER — Encounter: Payer: Self-pay | Admitting: Internal Medicine

## 2015-10-05 ENCOUNTER — Ambulatory Visit (INDEPENDENT_AMBULATORY_CARE_PROVIDER_SITE_OTHER): Payer: Commercial Managed Care - PPO | Admitting: Cardiology

## 2015-10-05 ENCOUNTER — Encounter: Payer: Self-pay | Admitting: Cardiology

## 2015-10-05 VITALS — BP 120/86 | HR 66 | Ht 70.0 in | Wt 147.8 lb

## 2015-10-05 DIAGNOSIS — R002 Palpitations: Secondary | ICD-10-CM | POA: Diagnosis not present

## 2015-10-05 DIAGNOSIS — I10 Essential (primary) hypertension: Secondary | ICD-10-CM

## 2015-10-05 DIAGNOSIS — E785 Hyperlipidemia, unspecified: Secondary | ICD-10-CM

## 2015-10-05 NOTE — Patient Instructions (Signed)
Your physician recommends that you continue on your current medications as directed. Please refer to the Current Medication list given to you today.   Your physician wants you to follow-up in: ONE YEAR WITH DR NELSON You will receive a reminder letter in the mail two months in advance. If you don't receive a letter, please call our office to schedule the follow-up appointment.  

## 2015-10-05 NOTE — Progress Notes (Signed)
Patient ID: Janeann MerlJames M Remo, male   DOB: 1981/05/14, 35 y.o.   MRN: [id number]     Patient ID: Janeann MerlJames M Golob, male   DOB: 1981/05/14, 35 y.o.   MRN: [id number]  Patient Name: Janeann MerlJames M Collett Date of Encounter: 10/05/2015  Primary Care Provider:  Nadean CorwinMCKEOWN,WILLIAM DAVID, MD Primary Cardiologist:  Tobias AlexanderNELSON, Izick Gasbarro H  Patient Profile  Palpitations, hypertension, chest tightness  Problem List   Past Medical History  Diagnosis Date  . HLD (hyperlipidemia)   . Anxiety   . Insomnia   . Lumbago   . Tension headache   . Unspecified vitamin D deficiency    No past surgical history on file.  Allergies  Allergies  Allergen Reactions  . Sulfa Antibiotics    HPI  A 35 year old male with h/o hyperlipidemia controlled by pravastatin, who is ongoing smoker and comes for concerns of hypertension, tachycardia and palpitation. He works as a Teaching laboratory technicianmulti-restaurant manager at [employer] and has a highly stressful job. He has noticed at work that he feels palpitations and some chest tightness at the lower sternum that last for few minutes. He also experiences it at home sometimes on his days off. There is no associated syncope, or SOB.  On two occassions when he checked his BP it was elevated. Once, when he was sick and vomiting it was 160, another time he checked it on the way home from work and it was 146.  He drink few beers after work daily. He is also concerned because his uncle has CAD and underwent CABG x4 at age 35.   10/05/2015 - coming after 1 year, still smoking 1 PPD, stable palpitations with midl chest pain not related to exertion, not associated with other symptoms, always during work hours. Continues to work in a high stress job. He makes 20-30.000 steps/24 hours. His most recent lipids on 08/31/2014 showed HDL 38, LDL 70, triglycerides 537.    Home Medications  Prior to Admission medications   Medication Sig Start Date End Date Taking? Authorizing Provider  alprazolam Prudy Feeler(XANAX) 2 MG  tablet Take 2 mg by mouth at bedtime as needed for sleep.   Yes Historical Provider, MD  amitriptyline (ELAVIL) 50 MG tablet Take 50 mg by mouth 3 (three) times daily as needed for sleep (back spasms).   Yes Historical Provider, MD  atenolol (TENORMIN) 50 MG tablet Take 25 mg by mouth daily.  05/26/13  Yes Historical Provider, MD  Cholecalciferol (VITAMIN D-3) 5000 UNITS TABS Take by mouth daily.   Yes Historical Provider, MD  Magnesium 250 MG TABS Take by mouth daily.   Yes Historical Provider, MD  pravastatin (PRAVACHOL) 40 MG tablet Take 20 mg by mouth daily.    Yes Historical Provider, MD    Family History  Family History  Problem Relation Age of Onset  . Hypertension Mother   . Diabetes Mother   . Heart disease Father   . Diabetes Father   . Colon cancer Maternal Grandfather   . Lung cancer Paternal Grandfather   . Breast cancer Maternal Grandmother   . Ovarian cancer Paternal Grandmother   . Hyperlipidemia Father     Social History  Social History   Social History  . Marital Status: Single    Spouse Name: N/A  . Number of Children: N/A  . Years of Education: N/A   Occupational History  . Not on file.   Social History Main Topics  . Smoking status: Current Every Day Smoker -- 0.75  packs/day    Types: Cigarettes  . Smokeless tobacco: Not on file  . Alcohol Use: 0.0 oz/week    0 Standard drinks or equivalent per week     Comment: 10 beers per week  . Drug Use: No  . Sexual Activity: Not on file   Other Topics Concern  . Not on file   Social History Narrative     Review of Systems General:  No chills, fever, night sweats or weight changes.  Cardiovascular:  + chest tightness, dyspnea on exertion, edema, orthopnea, + palpitations, paroxysmal nocturnal dyspnea. Dermatological: No rash, lesions/masses Respiratory: No cough, dyspnea Urologic: No hematuria, dysuria Abdominal:   No nausea, vomiting, diarrhea, bright red blood per rectum, melena, or  hematemesis Neurologic:  No visual changes, wkns, changes in mental status. All other systems reviewed and are otherwise negative except as noted above.  Physical Exam  BP 112/80, HR 85  General: Pleasant, NAD Psych: Normal affect. Neuro: Alert and oriented X 3. Moves all extremities spontaneously. HEENT: Normal  Neck: Supple without bruits or JVD. Lungs:  Resp regular and unlabored, CTA. Heart: RRR no s3, s4, or murmurs. Abdomen: Soft, non-tender, non-distended, BS + x 4.  Extremities: No clubbing, cyanosis or edema. DP/PT/Radials 2+ and equal bilaterally.  Accessory Clinical Findings  ECG - Normal SR, no ST changes  GXT: 09/20/2014 ST Segment Analysis At Rest: normal ST segments - no evidence of significant ST depression With Exercise: no evidence of significant ST depression  Other Information Arrhythmia:  No Angina during ETT:  present (1) Quality of ETT:  diagnostic  ETT Interpretation:  normal - no evidence of ischemia by ST analysis  Comments: Excellent exercise capacity. Patient did complain of chest pain (tightness) throughout. Normal BP response to exercise. No ST changes to suggest ischemia.  No atrial or ventricular arrhythmias.  Recommendations: F/u with Dr. Tobias Alexander as directed. Signed,  Tereso Newcomer, PA-C   09/20/2013 3:05 PM      Assessment & Plan  35 year old male   1. Palpitations with chest tightness - stress related, the patient takes Xanax for anxiety and states its usually when it wears off. 48 hour Holter showed Holter monitor showed persistent sinus tachycardia starting at 4 pm till 3 am, exactly when the patient is at work. HR now better controlled. No other arrhythmias. He is currently taking Nadolol 40 mg po BID. He still reports palpitations. An exercise treadmill stress test was normal.    2. Hypertension - controlled on nadolol.  3. Hyperlipidemia - LDL, HDL normal, high TAG, the patient admits to drinking the night  before. He is on Pravastatin currently and fenofibrates, still severely elevated TG.  4. Smoking cessation counseling provided. He will try nicotine patches. He is advised if he starts taking Chantix he should stop taking amitriptyline.  Follow up in 1 year.    Lars Masson, MD 10/05/2015, 12:24 PM

## 2015-10-09 ENCOUNTER — Telehealth: Payer: Self-pay | Admitting: *Deleted

## 2015-10-09 NOTE — Telephone Encounter (Signed)
Notified the pt that Dr Delton SeeNelson reviewed his labs scanned from his PCP and she noted that his TG are severely elevated, and recommends that he start taking fenofibrate with food and limit alcohol use.  Per the pt he states his PCP already advised him to start taking fenofibrate 160 mg po daily.  Informed the pt he should continue this regimen as advised by his PCP.  Informed the pt that I will route this message to Dr Delton SeeNelson to make her aware that he is already on fenofibrate, and follow-up with him thereafter if she recommends anything else.  Pt verbalized understanding and agrees with this plan.

## 2015-10-09 NOTE — Telephone Encounter (Signed)
-----   Message from Lars MassonKatarina H Nelson, MD sent at 10/07/2015 10:27 PM EST ----- His TG are severely elevated, please advise him to take fenofibrate with food and limit alcohol use. K

## 2015-10-09 NOTE — Telephone Encounter (Signed)
Pt states he is taking this medication at his biggest meal of the day.  Pt states he has completely limited his alcohol intake as well. Pt very appreciative for all the follow-up provided.

## 2015-11-09 ENCOUNTER — Encounter: Payer: Self-pay | Admitting: Internal Medicine

## 2015-11-09 ENCOUNTER — Other Ambulatory Visit: Payer: Self-pay | Admitting: Internal Medicine

## 2015-11-10 ENCOUNTER — Other Ambulatory Visit: Payer: Self-pay | Admitting: Internal Medicine

## 2015-11-10 DIAGNOSIS — G4700 Insomnia, unspecified: Secondary | ICD-10-CM

## 2015-11-10 NOTE — Telephone Encounter (Signed)
RX called into [facility].

## 2015-12-08 ENCOUNTER — Ambulatory Visit (INDEPENDENT_AMBULATORY_CARE_PROVIDER_SITE_OTHER): Payer: Commercial Managed Care - PPO | Admitting: Internal Medicine

## 2015-12-08 VITALS — BP 130/78 | HR 60 | Temp 98.2°F | Resp 16 | Wt 146.0 lb

## 2015-12-08 DIAGNOSIS — I10 Essential (primary) hypertension: Secondary | ICD-10-CM

## 2015-12-08 DIAGNOSIS — Z79899 Other long term (current) drug therapy: Secondary | ICD-10-CM

## 2015-12-08 DIAGNOSIS — E785 Hyperlipidemia, unspecified: Secondary | ICD-10-CM | POA: Diagnosis not present

## 2015-12-08 DIAGNOSIS — E559 Vitamin D deficiency, unspecified: Secondary | ICD-10-CM

## 2015-12-08 LAB — CBC WITH DIFFERENTIAL/PLATELET
BASOS PCT: 1 % (ref 0–1)
Basophils Absolute: 0.1 10*3/uL (ref 0.0–0.1)
EOS ABS: 0.3 10*3/uL (ref 0.0–0.7)
Eosinophils Relative: 4 % (ref 0–5)
HCT: 46.5 % (ref 39.0–52.0)
HEMOGLOBIN: 16.3 g/dL (ref 13.0–17.0)
LYMPHS ABS: 2.4 10*3/uL (ref 0.7–4.0)
Lymphocytes Relative: 34 % (ref 12–46)
MCH: 31 pg (ref 26.0–34.0)
MCHC: 35.1 g/dL (ref 30.0–36.0)
MCV: 88.6 fL (ref 78.0–100.0)
MONO ABS: 0.7 10*3/uL (ref 0.1–1.0)
MONOS PCT: 10 % (ref 3–12)
MPV: 9.8 fL (ref 8.6–12.4)
NEUTROS ABS: 3.7 10*3/uL (ref 1.7–7.7)
NEUTROS PCT: 51 % (ref 43–77)
Platelets: 206 10*3/uL (ref 150–400)
RBC: 5.25 MIL/uL (ref 4.22–5.81)
RDW: 13 % (ref 11.5–15.5)
WBC: 7.2 10*3/uL (ref 4.0–10.5)

## 2015-12-08 LAB — HEPATIC FUNCTION PANEL
ALBUMIN: 4.3 g/dL (ref 3.6–5.1)
ALT: 10 U/L (ref 9–46)
AST: 12 U/L (ref 10–40)
Alkaline Phosphatase: 49 U/L (ref 40–115)
BILIRUBIN DIRECT: 0.1 mg/dL (ref <=0.2)
Indirect Bilirubin: 0.3 mg/dL (ref 0.2–1.2)
TOTAL PROTEIN: 7.1 g/dL (ref 6.1–8.1)
Total Bilirubin: 0.4 mg/dL (ref 0.2–1.2)

## 2015-12-08 LAB — TSH: TSH: 2.4 m[IU]/L (ref 0.40–4.50)

## 2015-12-08 LAB — BASIC METABOLIC PANEL WITH GFR
BUN: 15 mg/dL (ref 7–25)
CALCIUM: 10.1 mg/dL (ref 8.6–10.3)
CHLORIDE: 102 mmol/L (ref 98–110)
CO2: 27 mmol/L (ref 20–31)
CREATININE: 0.95 mg/dL (ref 0.60–1.35)
GFR, Est African American: >89 mL/min (ref >=60)
GFR, Est Non African American: >89 mL/min (ref >=60)
GLUCOSE: 111 mg/dL — AB (ref 65–99)
Potassium: 4.5 mmol/L (ref 3.5–5.3)
SODIUM: 138 mmol/L (ref 135–146)

## 2015-12-08 LAB — LIPID PANEL
CHOLESTEROL: 137 mg/dL (ref 125–200)
HDL: 49 mg/dL (ref >=40)
LDL Cholesterol: 53 mg/dL (ref <130)
TRIGLYCERIDES: 175 mg/dL — AB (ref <150)
Total CHOL/HDL Ratio: 2.8 Ratio (ref <=5.0)
VLDL: 35 mg/dL — AB (ref <30)

## 2015-12-08 NOTE — Progress Notes (Signed)
Assessment and Plan:  Hypertension:  -Continue medication,  -monitor blood pressure at home.  -Continue DASH diet.   -Reminder to go to the ER if any CP, SOB, nausea, dizziness, severe HA, changes vision/speech, left arm numbness and tingling, and jaw pain.  Cholesterol: -Continue diet and exercise.  -Check cholesterol.  -if triglycerides decreased due to decrease in drinking try stopping fenofibrate  Pre-diabetes: -Continue diet and exercise.   Vitamin D Def: -continue medications.   Tobacco Abuse -trying to wean down currently -goal is to quit -declines medicines at this time  Continue diet and meds as discussed. Further disposition pending results of labs.  HPI 35 y.o. male  presents for 3 month follow up with hypertension, hyperlipidemia, prediabetes and vitamin D.   His blood pressure has been controlled at home, today their BP is BP: 130/78 mmHg.   He does not workout. He denies chest pain, shortness of breath, dizziness.   He is on cholesterol medication and denies myalgias. His cholesterol is not at goal. The cholesterol last visit was:   Lab Results  Component Value Date   CHOL 196 09/07/2015   HDL 38* 09/07/2015   LDLCALC NOT CALC 09/07/2015   TRIG 537* 09/07/2015   CHOLHDL 5.2* 09/07/2015     He has been working on diet and exercise for prediabetes, and denies foot ulcerations, hyperglycemia, hypoglycemia , increased appetite, nausea, paresthesia of the feet, polydipsia, polyuria, visual disturbances, vomiting and weight loss. Last A1C in the office was:  Lab Results  Component Value Date   HGBA1C 5.2 06/06/2015    Patient is on Vitamin D supplement.  Lab Results  Component Value Date   VD25OH 52 06/06/2015      Patients reports that he did do 6 weeks of PT for his back which does tend to help.  He reports that he is still doing exercise.  He reports that he has been taking his fenofibrate and has been doing okay.  He has cut back drastically on his  drinking.    Current Medications:  Current Outpatient Prescriptions on File Prior to Visit  Medication Sig Dispense Refill  . alprazolam (XANAX) 2 MG tablet TAKE 1/2 TO 1 TABLET BY MOUTH 1 HR BEFORE BEDTIME AS NEEDED 30 tablet 2  . amitriptyline (ELAVIL) 50 MG tablet TAKE 1 TABLET BY MOUTH 3 TIMES A DAY FOR CHRONIC NECK PAIN 270 tablet 1  . Cholecalciferol (VITAMIN D-3) 5000 UNITS TABS Take 1 tablet by mouth daily.     . fenofibrate 160 MG tablet Take 1 tablet (160 mg total) by mouth daily. 90 tablet 0  . Magnesium 250 MG TABS Take 1 tablet by mouth daily.     . methocarbamol (ROBAXIN) 500 MG tablet Take 1 tablet by mouth 3 (three) times daily as needed. pain    . nadolol (CORGARD) 80 MG tablet Take by mouth daily. Take 1/2 to 1 tablet by mouth daily for blood pressure and heart palpitations    . Omega-3 Fatty Acids (FISH OIL PO) Take 300 mg by mouth daily.    . pravastatin (PRAVACHOL) 40 MG tablet TAKE 1 TABLET BY MOUTH AT BEDTIME (INS PAYS FOR 30 DAYS) 90 tablet 1   No current facility-administered medications on file prior to visit.    Medical History:  Past Medical History  Diagnosis Date  . HLD (hyperlipidemia)   . Anxiety   . Insomnia   . Lumbago   . Tension headache   . Unspecified vitamin D deficiency  Allergies:  Allergies  Allergen Reactions  . Sulfa Antibiotics      Review of Systems:  Review of Systems  Constitutional: Negative for fever, chills and malaise/fatigue.  HENT: Negative for congestion, ear pain and sore throat.   Eyes: Negative.   Respiratory: Negative for cough, shortness of breath and wheezing.   Cardiovascular: Negative for chest pain, palpitations and leg swelling.  Gastrointestinal: Negative for heartburn, abdominal pain, diarrhea, constipation, blood in stool and melena.  Genitourinary: Negative.   Skin: Negative.   Neurological: Negative for dizziness, sensory change, loss of consciousness and headaches.  Psychiatric/Behavioral: Negative  for depression. The patient is not nervous/anxious and does not have insomnia.     Family history- Review and unchanged  Social history- Review and unchanged  Physical Exam: BP 130/78 mmHg  Pulse 60  Temp(Src) 98.2 F (36.8 C)  Resp 16  Wt 146 lb (66.225 kg) Wt Readings from Last 3 Encounters:  12/08/15 146 lb (66.225 kg)  10/05/15 147 lb 12.8 oz (67.042 kg)  09/27/15 141 lb (63.957 kg)    General Appearance: Well nourished well developed, in no apparent distress. Eyes: PERRLA, EOMs, conjunctiva no swelling or erythema ENT/Mouth: Ear canals normal without obstruction, swelling, erythma, discharge.  TMs normal bilaterally.  Oropharynx moist, clear, without exudate, or postoropharyngeal swelling. Neck: Supple, thyroid normal,no cervical adenopathy  Respiratory: Respiratory effort normal, Breath sounds clear A&P without rhonchi, wheeze, or rale.  No retractions, no accessory usage. Cardio: RRR with no MRGs. Brisk peripheral pulses without edema.  Abdomen: Soft, + BS,  Non tender, no guarding, rebound, hernias, masses. Musculoskeletal: Full ROM, 5/5 strength, Normal gait Skin: Warm, dry without rashes, lesions, ecchymosis.  Neuro: Awake and oriented X 3, Cranial nerves intact. Normal muscle tone, no cerebellar symptoms. Psych: Normal affect, Insight and Judgment appropriate.     Terri Piedra, PA-C 12:07 PM [facility]

## 2015-12-20 ENCOUNTER — Other Ambulatory Visit: Payer: Self-pay | Admitting: Internal Medicine

## 2016-01-10 ENCOUNTER — Other Ambulatory Visit: Payer: Self-pay | Admitting: Internal Medicine

## 2016-01-10 NOTE — Telephone Encounter (Signed)
PT AWARE OF LAB RESULT.

## 2016-01-11 ENCOUNTER — Other Ambulatory Visit: Payer: Self-pay | Admitting: Internal Medicine

## 2016-04-10 ENCOUNTER — Other Ambulatory Visit: Payer: Self-pay | Admitting: Physician Assistant

## 2016-04-10 DIAGNOSIS — G4700 Insomnia, unspecified: Secondary | ICD-10-CM

## 2016-04-17 ENCOUNTER — Other Ambulatory Visit: Payer: Self-pay | Admitting: Physician Assistant

## 2016-06-14 ENCOUNTER — Other Ambulatory Visit: Payer: Self-pay | Admitting: Internal Medicine

## 2016-06-26 ENCOUNTER — Encounter: Payer: Self-pay | Admitting: Internal Medicine

## 2016-06-28 ENCOUNTER — Ambulatory Visit (INDEPENDENT_AMBULATORY_CARE_PROVIDER_SITE_OTHER): Payer: BLUE CROSS/BLUE SHIELD | Admitting: Internal Medicine

## 2016-06-28 ENCOUNTER — Encounter: Payer: Self-pay | Admitting: Internal Medicine

## 2016-06-28 VITALS — BP 118/84 | HR 80 | Temp 97.5°F | Resp 16 | Ht 70.5 in | Wt 135.6 lb

## 2016-06-28 DIAGNOSIS — Z0000 Encounter for general adult medical examination without abnormal findings: Secondary | ICD-10-CM

## 2016-06-28 DIAGNOSIS — I10 Essential (primary) hypertension: Secondary | ICD-10-CM | POA: Diagnosis not present

## 2016-06-28 DIAGNOSIS — E559 Vitamin D deficiency, unspecified: Secondary | ICD-10-CM | POA: Diagnosis not present

## 2016-06-28 DIAGNOSIS — Z1212 Encounter for screening for malignant neoplasm of rectum: Secondary | ICD-10-CM

## 2016-06-28 DIAGNOSIS — Z111 Encounter for screening for respiratory tuberculosis: Secondary | ICD-10-CM

## 2016-06-28 DIAGNOSIS — R5383 Other fatigue: Secondary | ICD-10-CM

## 2016-06-28 DIAGNOSIS — Z136 Encounter for screening for cardiovascular disorders: Secondary | ICD-10-CM

## 2016-06-28 DIAGNOSIS — E782 Mixed hyperlipidemia: Secondary | ICD-10-CM

## 2016-06-28 DIAGNOSIS — Z0001 Encounter for general adult medical examination with abnormal findings: Secondary | ICD-10-CM

## 2016-06-28 DIAGNOSIS — R7309 Other abnormal glucose: Secondary | ICD-10-CM

## 2016-06-28 DIAGNOSIS — Z79899 Other long term (current) drug therapy: Secondary | ICD-10-CM | POA: Diagnosis not present

## 2016-06-28 DIAGNOSIS — R002 Palpitations: Secondary | ICD-10-CM

## 2016-06-28 LAB — HEPATIC FUNCTION PANEL
ALT: 17 U/L (ref 9–46)
AST: 20 U/L (ref 10–40)
Albumin: 4.6 g/dL (ref 3.6–5.1)
Alkaline Phosphatase: 45 U/L (ref 40–115)
BILIRUBIN DIRECT: 0.2 mg/dL (ref <=0.2)
BILIRUBIN INDIRECT: 1 mg/dL (ref 0.2–1.2)
Total Bilirubin: 1.2 mg/dL (ref 0.2–1.2)
Total Protein: 7.5 g/dL (ref 6.1–8.1)

## 2016-06-28 LAB — CBC WITH DIFFERENTIAL/PLATELET
BASOS PCT: 1 %
Basophils Absolute: 69 cells/uL (ref 0–200)
EOS ABS: 276 {cells}/uL (ref 15–500)
Eosinophils Relative: 4 %
HEMATOCRIT: 46.7 % (ref 38.5–50.0)
HEMOGLOBIN: 16.3 g/dL (ref 13.2–17.1)
LYMPHS ABS: 2829 {cells}/uL (ref 850–3900)
LYMPHS PCT: 41 %
MCH: 31.5 pg (ref 27.0–33.0)
MCHC: 34.9 g/dL (ref 32.0–36.0)
MCV: 90.2 fL (ref 80.0–100.0)
MONO ABS: 621 {cells}/uL (ref 200–950)
MPV: 9.3 fL (ref 7.5–12.5)
Monocytes Relative: 9 %
NEUTROS PCT: 45 %
Neutro Abs: 3105 cells/uL (ref 1500–7800)
Platelets: 180 10*3/uL (ref 140–400)
RBC: 5.18 MIL/uL (ref 4.20–5.80)
RDW: 13.2 % (ref 11.0–15.0)
WBC: 6.9 10*3/uL (ref 3.8–10.8)

## 2016-06-28 LAB — VITAMIN B12: Vitamin B-12: 458 pg/mL (ref 200–1100)

## 2016-06-28 LAB — LIPID PANEL
CHOL/HDL RATIO: 2.9 ratio (ref <=5.0)
CHOLESTEROL: 185 mg/dL (ref 125–200)
HDL: 64 mg/dL (ref >=40)
LDL Cholesterol: 91 mg/dL (ref <130)
TRIGLYCERIDES: 151 mg/dL — AB (ref <150)
VLDL: 30 mg/dL (ref <30)

## 2016-06-28 LAB — IRON AND TIBC
%SAT: 74 % — ABNORMAL HIGH (ref 15–60)
IRON: 212 ug/dL — AB (ref 50–180)
TIBC: 288 ug/dL (ref 250–425)
UIBC: 76 ug/dL — AB (ref 125–400)

## 2016-06-28 LAB — BASIC METABOLIC PANEL WITH GFR
BUN: 17 mg/dL (ref 7–25)
CO2: 29 mmol/L (ref 20–31)
Calcium: 10 mg/dL (ref 8.6–10.3)
Chloride: 103 mmol/L (ref 98–110)
Creat: 0.93 mg/dL (ref 0.60–1.35)
GLUCOSE: 99 mg/dL (ref 65–99)
POTASSIUM: 4.1 mmol/L (ref 3.5–5.3)
Sodium: 139 mmol/L (ref 135–146)

## 2016-06-28 LAB — MAGNESIUM: Magnesium: 2.1 mg/dL (ref 1.5–2.5)

## 2016-06-28 LAB — TESTOSTERONE: Testosterone: 707 ng/dL (ref 250–827)

## 2016-06-28 LAB — TSH: TSH: 3.49 mIU/L (ref 0.40–4.50)

## 2016-06-28 NOTE — Patient Instructions (Addendum)
Preventive Care for Adults  A healthy lifestyle and preventive care can promote health and wellness. Preventive health guidelines for men include the following key practices:  A routine yearly physical is a good way to check with your health care provider about your health and preventative screening. It is a chance to share any concerns and updates on your health and to receive a thorough exam.  Visit your dentist for a routine exam and preventative care every 6 months. Brush your teeth twice a day and floss once a day. Good oral hygiene prevents tooth decay and gum disease.  The frequency of eye exams is based on your age, health, family medical history, use of contact lenses, and other factors. Follow your health care provider's recommendations for frequency of eye exams.  Eat a healthy diet. Foods such as vegetables, fruits, whole grains, low-fat dairy products, and lean protein foods contain the nutrients you need without too many calories. Decrease your intake of foods high in solid fats, added sugars, and salt. Eat the right amount of calories for you.Get information about a proper diet from your health care provider, if necessary.  Regular physical exercise is one of the most important things you can do for your health. Most adults should get at least 150 minutes of moderate-intensity exercise (any activity that increases your heart rate and causes you to sweat) each week. In addition, most adults need muscle-strengthening exercises on 2 or more days a week.  Maintain a healthy weight. The body mass index (BMI) is a screening tool to identify possible weight problems. It provides an estimate of body fat based on height and weight. Your health care provider can find your BMI and can help you achieve or maintain a healthy weight.For adults 20 years and older:  A BMI below 18.5 is considered underweight.  A BMI of 18.5 to 24.9 is normal.  A BMI of 25 to 29.9 is considered overweight.  A  BMI of 30 and above is considered obese.  Maintain normal blood lipids and cholesterol levels by exercising and minimizing your intake of saturated fat. Eat a balanced diet with plenty of fruit and vegetables. Blood tests for lipids and cholesterol should begin at age 20 and be repeated every 5 years. If your lipid or cholesterol levels are high, you are over 50, or you are at high risk for heart disease, you may need your cholesterol levels checked more frequently.Ongoing high lipid and cholesterol levels should be treated with medicines if diet and exercise are not working.  If you smoke, find out from your health care provider how to quit. If you do not use tobacco, do not start.  Lung cancer screening is recommended for adults aged 55-80 years who are at high risk for developing lung cancer because of a history of smoking. A yearly low-dose CT scan of the lungs is recommended for people who have at least a 30-pack-year history of smoking and are a current smoker or have quit within the past 15 years. A pack year of smoking is smoking an average of 1 pack of cigarettes a day for 1 year (for example: 1 pack a day for 30 years or 2 packs a day for 15 years). Yearly screening should continue until the smoker has stopped smoking for at least 15 years. Yearly screening should be stopped for people who develop a health problem that would prevent them from having lung cancer treatment.  If you choose to drink alcohol, do not have more   than 2 drinks per day. One drink is considered to be 12 ounces (355 mL) of beer, 5 ounces (148 mL) of wine, or 1.5 ounces (44 mL) of liquor.  High blood pressure causes heart disease and increases the risk of stroke. Your blood pressure should be checked. Ongoing high blood pressure should be treated with medicines, if weight loss and exercise are not effective.  If you are 38-65 years old, ask your health care provider if you should take aspirin to prevent heart  disease.  Diabetes screening involves taking a blood sample to check your fasting blood sugar level. Testing should be considered at a younger age or be carried out more frequently if you are overweight and have at least 1 risk factor for diabetes.  Colorectal cancer can be detected and often prevented. Most routine colorectal cancer screening begins at the age of 56 and continues through age 26. However, your health care provider may recommend screening at an earlier age if you have risk factors for colon cancer. On a yearly basis, your health care provider may provide home test kits to check for hidden blood in the stool. Use of a small camera at the end of a tube to directly examine the colon (sigmoidoscopy or colonoscopy) can detect the earliest forms of colorectal cancer. Talk to your health care provider about this at age 96, when routine screening begins. Direct exam of the colon should be repeated every 5-10 years through age 69, unless early forms of precancerous polyps or small growths are found.  Screening for abdominal aortic aneurysm (AAA)  are recommended for persons over age 25 who have history of hypertensionor who are current or former smokers.  Talk with your health care provider about prostate cancer screening.  Testicular cancer screening is recommended for adult males. Screening includes self-exam, a health care provider exam, and other screening tests. Consult with your health care provider about any symptoms you have or any concerns you have about testicular cancer.  Use sunscreen. Apply sunscreen liberally and repeatedly throughout the day. You should seek shade when your shadow is shorter than you. Protect yourself by wearing long sleeves, pants, a wide-brimmed hat, and sunglasses year round, whenever you are outdoors.  Once a month, do a whole-body skin exam, using a mirror to look at the skin on your back. Tell your health care provider about new moles, moles that have  irregular borders, moles that are larger than a pencil eraser, or moles that have changed in shape or color.  Stay current with required vaccines (immunizations).  Influenza vaccine. All adults should be immunized every year.  Tetanus, diphtheria, and acellular pertussis (Td, Tdap) vaccine. An adult who has not previously received Tdap or who does not know his vaccine status should receive 1 dose of Tdap. This initial dose should be followed by tetanus and diphtheria toxoids (Td) booster doses every 10 years. Adults with an unknown or incomplete history of completing a 3-dose immunization series with Td-containing vaccines should begin or complete a primary immunization series including a Tdap dose. Adults should receive a Td booster every 10 years.  Zoster vaccine. One dose is recommended for adults aged 26 years or older unless certain conditions are present.    Pneumococcal 13-valent conjugate (PCV13) vaccine. When indicated, a person who is uncertain of his immunization history and has no record of immunization should receive the PCV13 vaccine. An adult aged 52 years or older who has certain medical conditions and has not been previously immunized  should receive 1 dose of PCV13 vaccine. This PCV13 should be followed with a dose of pneumococcal polysaccharide (PPSV23) vaccine. The PPSV23 vaccine dose should be obtained at least 8 weeks after the dose of PCV13 vaccine. An adult aged 104 years or older who has certain medical conditions and previously received 1 or more doses of PPSV23 vaccine should receive 1 dose of PCV13. The PCV13 vaccine dose should be obtained 1 or more years after the last PPSV23 vaccine dose.    Pneumococcal polysaccharide (PPSV23) vaccine. When PCV13 is also indicated, PCV13 should be obtained first. All adults aged 71 years and older should be immunized. An adult younger than age 40 years who has certain medical conditions should be immunized. Any person who resides in a  nursing home or long-term care facility should be immunized. An adult smoker should be immunized. People with an immunocompromised condition and certain other conditions should receive both PCV13 and PPSV23 vaccines. People with human immunodeficiency virus (HIV) infection should be immunized as soon as possible after diagnosis. Immunization during chemotherapy or radiation therapy should be avoided. Routine use of PPSV23 vaccine is not recommended for American Indians, [place] Natives, or people younger than 65 years unless there are medical conditions that require PPSV23 vaccine. When indicated, people who have unknown immunization and have no record of immunization should receive PPSV23 vaccine. One-time revaccination 5 years after the first dose of PPSV23 is recommended for people aged 19-64 years who have chronic kidney failure, nephrotic syndrome, asplenia, or immunocompromised conditions. People who received 1-2 doses of PPSV23 before age 82 years should receive another dose of PPSV23 vaccine at age 75 years or later if at least 5 years have passed since the previous dose. Doses of PPSV23 are not needed for people immunized with PPSV23 at or after age 49 years.  Hepatitis A vaccine. Adults who wish to be protected from this disease, have certain high-risk conditions, work with hepatitis A-infected animals, work in hepatitis A research labs, or travel to or work in countries with a high rate of hepatitis A should be immunized. Adults who were previously unvaccinated and who anticipate close contact with an international adoptee during the first 60 days after arrival in the [place] from a country with a high rate of hepatitis A should be immunized.  Hepatitis B vaccine. Adults should be immunized if they wish to be protected from this disease, have certain high-risk conditions, may be exposed to blood or other infectious body fluids, are household contacts or sex partners of hepatitis B positive  people, are clients or workers in certain care facilities, or travel to or work in countries with a high rate of hepatitis B.  Preventive Service / Frequency  Ages 59 to 45  Blood pressure check.  Lipid and cholesterol check.  Hepatitis C blood test.** / For any individual with known risks for hepatitis C.  Skin self-exam. / Monthly.  Influenza vaccine. / Every year.  Tetanus, diphtheria, and acellular pertussis (Tdap, Td) vaccine.** / Consult your health care provider. 1 dose of Td every 10 years.  HPV vaccine. / 3 doses over 6 months, if 51 or younger.  Measles, mumps, rubella (MMR) vaccine.** / You need at least 1 dose of MMR if you were born in 1957 or later. You may also need a second dose.  Pneumococcal 13-valent conjugate (PCV13) vaccine.** / Consult your health care provider.  Pneumococcal polysaccharide (PPSV23) vaccine.** / 1 to 2 doses if you smoke cigarettes or if you have  certain conditions.  Meningococcal vaccine.** / 1 dose if you are age 65 to 56 years and a Market researcher living in a residence hall, or have one of several medical conditions. You may also need additional booster doses.  Hepatitis A vaccine.** / Consult your health care provider.  Hepatitis B vaccine.** / Consult your health care provider. +++++++++ Recommend Adult Low Dose Aspirin or  coated  Aspirin 81 mg daily  To reduce risk of Colon Cancer 20 %,  Skin Cancer 26 % ,  Melanoma 46%  and  Pancreatic cancer 60% ++++++++++++++++++ Vitamin D goal  is between 70-100.  Please make sure that you are taking your Vitamin D as directed.  It is very important as a natural anti-inflammatory  helping hair, skin, and nails, as well as reducing stroke and heart attack risk.  It helps your bones and helps with mood. It also decreases numerous cancer risks so please take it as directed.  Low Vit D is associated with a 200-300% higher risk for CANCER  and 200-300% higher risk for HEART    ATTACK  &  STROKE.   .....................................Marland Kitchen It is also associated with higher death rate at younger ages,  autoimmune diseases like Rheumatoid arthritis, Lupus, Multiple Sclerosis.    Also many other serious conditions, like depression, Alzheimer's Dementia, infertility, muscle aches, fatigue, fibromyalgia - just to name a few. +++++++++++++++++++++ Recommend the book "The END of DIETING" by Dr Excell Seltzer  & the book "The END of DIABETES " by Dr Excell Seltzer At [email or web address] - get book & Audio CD's    Being diabetic has a  300% increased risk for heart attack, stroke, cancer, and alzheimer- type vascular dementia. It is very important that you work harder with diet by avoiding all foods that are white. Avoid white rice (brown & wild rice is OK), white potatoes (sweetpotatoes in moderation is OK), White bread or wheat bread or anything made out of white flour like bagels, donuts, rolls, buns, biscuits, cakes, pastries, cookies, pizza crust, and pasta (made from white flour & egg whites) - vegetarian pasta or spinach or wheat pasta is OK. Multigrain breads like Arnold's or [employer], or multigrain sandwich thins or flatbreads.  Diet, exercise and weight loss can reverse and cure diabetes in the early stages.  Diet, exercise and weight loss is very important in the control and prevention of complications of diabetes which affects every system in your body, ie. Brain - dementia/stroke, eyes - glaucoma/blindness, heart - heart attack/heart failure, kidneys - dialysis, stomach - gastric paralysis, intestines - malabsorption, nerves - severe painful neuritis, circulation - gangrene & loss of a leg(s), and finally cancer and Alzheimers.    I recommend avoid fried & greasy foods,  sweets/candy, white rice (brown or wild rice or Quinoa is OK), white potatoes (sweet potatoes are OK) - anything made from white flour - bagels, doughnuts, rolls, buns, biscuits,white and wheat breads, pizza crust  and traditional pasta made of white flour & egg white(vegetarian pasta or spinach or wheat pasta is OK).  Multi-grain bread is OK - like multi-grain flat bread or sandwich thins. Avoid alcohol in excess. Exercise is also important.    Eat all the vegetables you want - avoid meat, especially red meat and dairy - especially cheese.  Cheese is the most concentrated form of trans-fats which is the worst thing to clog up our arteries. Veggie cheese is OK which can be found in the fresh produce section at [facility] or  [employer] or [employer]  +++++++++++++++++++ DASH Eating Plan  DASH stands for "Dietary Approaches to Stop Hypertension."   The DASH eating plan is a healthy eating plan that has been shown to reduce high blood pressure (hypertension). Additional health benefits may include reducing the risk of type 2 diabetes mellitus, heart disease, and stroke. The DASH eating plan may also help with weight loss. WHAT DO I NEED TO KNOW ABOUT THE DASH EATING PLAN? For the DASH eating plan, you will follow these general guidelines:  Choose foods with a percent daily value for sodium of less than 5% (as listed on the food label).  Use salt-free seasonings or herbs instead of table salt or sea salt.  Check with your health care provider or pharmacist before using salt substitutes.  Eat lower-sodium products, often labeled as "lower sodium" or "no salt added."  Eat fresh foods.  Eat more vegetables, fruits, and low-fat dairy products.  Choose whole grains. Look for the word "whole" as the first word in the ingredient list.  Choose fish   Limit sweets, desserts, sugars, and sugary drinks.  Choose heart-healthy fats.  Eat veggie cheese   Eat more home-cooked food and less restaurant, buffet, and fast food.  Limit fried foods.  Cook foods using methods other than frying.  Limit canned vegetables. If you do use them, rinse them well to decrease the sodium.  When eating at a  restaurant, ask that your food be prepared with less salt, or no salt if possible.                      WHAT FOODS CAN I EAT? Read Dr Fara Olden Fuhrman's books on The End of Dieting & The End of Diabetes  Grains Whole grain or whole wheat bread. Brown rice. Whole grain or whole wheat pasta. Quinoa, bulgur, and whole grain cereals. Low-sodium cereals. Corn or whole wheat flour tortillas. Whole grain cornbread. Whole grain crackers. Low-sodium crackers.  Vegetables Fresh or frozen vegetables (raw, steamed, roasted, or grilled). Low-sodium or reduced-sodium tomato and vegetable juices. Low-sodium or reduced-sodium tomato sauce and paste. Low-sodium or reduced-sodium canned vegetables.   Fruits All fresh, canned (in natural juice), or frozen fruits.  Protein Products  All fish and seafood.  Dried beans, peas, or lentils. Unsalted nuts and seeds. Unsalted canned beans.  Dairy Low-fat dairy products, such as skim or 1% milk, 2% or reduced-fat cheeses, low-fat ricotta or cottage cheese, or plain low-fat yogurt. Low-sodium or reduced-sodium cheeses.  Fats and Oils Tub margarines without trans fats. Light or reduced-fat mayonnaise and salad dressings (reduced sodium). Avocado. Safflower, olive, or canola oils. Natural peanut or almond butter.  Other Unsalted popcorn and pretzels. The items listed above may not be a complete list of recommended foods or beverages. Contact your dietitian for more options.  +++++++++++++++++++  WHAT FOODS ARE NOT RECOMMENDED? Grains/ White flour or wheat flour White bread. White pasta. White rice. Refined cornbread. Bagels and croissants. Crackers that contain trans fat.  Vegetables  Creamed or fried vegetables. Vegetables in a . Regular canned vegetables. Regular canned tomato sauce and paste. Regular tomato and vegetable juices.  Fruits Dried fruits. Canned fruit in light or heavy syrup. Fruit juice.  Meat and Other Protein Products Meat in general - RED  meat & White meat.  Fatty cuts of meat. Ribs, chicken wings, all processed meats as bacon, sausage, bologna, salami, fatback, hot dogs, bratwurst and packaged luncheon meats.  Dairy Whole or 2% milk, cream,  half-and-half, and cream cheese. Whole-fat or sweetened yogurt. Full-fat cheeses or blue cheese. Non-dairy creamers and whipped toppings. Processed cheese, cheese spreads, or cheese curds.  Condiments Onion and garlic salt, seasoned salt, table salt, and sea salt. Canned and packaged gravies. Worcestershire sauce. Tartar sauce. Barbecue sauce. Teriyaki sauce. Soy sauce, including reduced sodium. Steak sauce. Fish sauce. Oyster sauce. Cocktail sauce. Horseradish. Ketchup and mustard. Meat flavorings and tenderizers. Bouillon cubes. Hot sauce. Tabasco sauce. Marinades. Taco seasonings. Relishes.  Fats and Oils Butter, stick margarine, lard, shortening and bacon fat. Coconut, palm kernel, or palm oils. Regular salad dressings.  Pickles and olives. Salted popcorn and pretzels.  The items listed above may not be a complete list of foods and beverages to avoid.   Preventive Care for Adults  A healthy lifestyle and preventive care can promote health and wellness. Preventive health guidelines for men include the following key practices:  A routine yearly physical is a good way to check with your health care provider about your health and preventative screening. It is a chance to share any concerns and updates on your health and to receive a thorough exam.  Visit your dentist for a routine exam and preventative care every 6 months. Brush your teeth twice a day and floss once a day. Good oral hygiene prevents tooth decay and gum disease.  The frequency of eye exams is based on your age, health, family medical history, use of contact lenses, and other factors. Follow your health care provider's recommendations for frequency of eye exams.  Eat a healthy diet. Foods such as vegetables, fruits, whole  grains, low-fat dairy products, and lean protein foods contain the nutrients you need without too many calories. Decrease your intake of foods high in solid fats, added sugars, and salt. Eat the right amount of calories for you.Get information about a proper diet from your health care provider, if necessary.  Regular physical exercise is one of the most important things you can do for your health. Most adults should get at least 150 minutes of moderate-intensity exercise (any activity that increases your heart rate and causes you to sweat) each week. In addition, most adults need muscle-strengthening exercises on 2 or more days a week.  Maintain a healthy weight. The body mass index (BMI) is a screening tool to identify possible weight problems. It provides an estimate of body fat based on height and weight. Your health care provider can find your BMI and can help you achieve or maintain a healthy weight.For adults 20 years and older:  A BMI below 18.5 is considered underweight.  A BMI of 18.5 to 24.9 is normal.  A BMI of 25 to 29.9 is considered overweight.  A BMI of 30 and above is considered obese.  Maintain normal blood lipids and cholesterol levels by exercising and minimizing your intake of saturated fat. Eat a balanced diet with plenty of fruit and vegetables. Blood tests for lipids and cholesterol should begin at age 88 and be repeated every 5 years. If your lipid or cholesterol levels are high, you are over 50, or you are at high risk for heart disease, you may need your cholesterol levels checked more frequently.Ongoing high lipid and cholesterol levels should be treated with medicines if diet and exercise are not working.  If you smoke, find out from your health care provider how to quit. If you do not use tobacco, do not start.  Lung cancer screening is recommended for adults aged 70-80 years who are at  high risk for developing lung cancer because of a history of smoking. A yearly  low-dose CT scan of the lungs is recommended for people who have at least a 30-pack-year history of smoking and are a current smoker or have quit within the past 15 years. A pack year of smoking is smoking an average of 1 pack of cigarettes a day for 1 year (for example: 1 pack a day for 30 years or 2 packs a day for 15 years). Yearly screening should continue until the smoker has stopped smoking for at least 15 years. Yearly screening should be stopped for people who develop a health problem that would prevent them from having lung cancer treatment.  If you choose to drink alcohol, do not have more than 2 drinks per day. One drink is considered to be 12 ounces (355 mL) of beer, 5 ounces (148 mL) of wine, or 1.5 ounces (44 mL) of liquor.  High blood pressure causes heart disease and increases the risk of stroke. Your blood pressure should be checked. Ongoing high blood pressure should be treated with medicines, if weight loss and exercise are not effective.  If you are 42-6 years old, ask your health care provider if you should take aspirin to prevent heart disease.  Diabetes screening involves taking a blood sample to check your fasting blood sugar level. Testing should be considered at a younger age or be carried out more frequently if you are overweight and have at least 1 risk factor for diabetes.  Colorectal cancer can be detected and often prevented. Most routine colorectal cancer screening begins at the age of 29 and continues through age 37. However, your health care provider may recommend screening at an earlier age if you have risk factors for colon cancer. On a yearly basis, your health care provider may provide home test kits to check for hidden blood in the stool. Use of a small camera at the end of a tube to directly examine the colon (sigmoidoscopy or colonoscopy) can detect the earliest forms of colorectal cancer. Talk to your health care provider about this at age 33, when routine  screening begins. Direct exam of the colon should be repeated every 5-10 years through age 93, unless early forms of precancerous polyps or small growths are found.  Screening for abdominal aortic aneurysm (AAA)  are recommended for persons over age 49 who have history of hypertensionor who are current or former smokers.  Talk with your health care provider about prostate cancer screening.  Testicular cancer screening is recommended for adult males. Screening includes self-exam, a health care provider exam, and other screening tests. Consult with your health care provider about any symptoms you have or any concerns you have about testicular cancer.  Use sunscreen. Apply sunscreen liberally and repeatedly throughout the day. You should seek shade when your shadow is shorter than you. Protect yourself by wearing long sleeves, pants, a wide-brimmed hat, and sunglasses year round, whenever you are outdoors.  Once a month, do a whole-body skin exam, using a mirror to look at the skin on your back. Tell your health care provider about new moles, moles that have irregular borders, moles that are larger than a pencil eraser, or moles that have changed in shape or color.  Stay current with required vaccines (immunizations).  Influenza vaccine. All adults should be immunized every year.  Tetanus, diphtheria, and acellular pertussis (Td, Tdap) vaccine. An adult who has not previously received Tdap or who does not know his  vaccine status should receive 1 dose of Tdap. This initial dose should be followed by tetanus and diphtheria toxoids (Td) booster doses every 10 years. Adults with an unknown or incomplete history of completing a 3-dose immunization series with Td-containing vaccines should begin or complete a primary immunization series including a Tdap dose. Adults should receive a Td booster every 10 years.  Zoster vaccine. One dose is recommended for adults aged 56 years or older unless certain  conditions are present.    Pneumococcal 13-valent conjugate (PCV13) vaccine. When indicated, a person who is uncertain of his immunization history and has no record of immunization should receive the PCV13 vaccine. An adult aged 54 years or older who has certain medical conditions and has not been previously immunized should receive 1 dose of PCV13 vaccine. This PCV13 should be followed with a dose of pneumococcal polysaccharide (PPSV23) vaccine. The PPSV23 vaccine dose should be obtained at least 8 weeks after the dose of PCV13 vaccine. An adult aged 34 years or older who has certain medical conditions and previously received 1 or more doses of PPSV23 vaccine should receive 1 dose of PCV13. The PCV13 vaccine dose should be obtained 1 or more years after the last PPSV23 vaccine dose.    Pneumococcal polysaccharide (PPSV23) vaccine. When PCV13 is also indicated, PCV13 should be obtained first. All adults aged 45 years and older should be immunized. An adult younger than age 48 years who has certain medical conditions should be immunized. Any person who resides in a nursing home or long-term care facility should be immunized. An adult smoker should be immunized. People with an immunocompromised condition and certain other conditions should receive both PCV13 and PPSV23 vaccines. People with human immunodeficiency virus (HIV) infection should be immunized as soon as possible after diagnosis. Immunization during chemotherapy or radiation therapy should be avoided. Routine use of PPSV23 vaccine is not recommended for American Indians, [place] Natives, or people younger than 65 years unless there are medical conditions that require PPSV23 vaccine. When indicated, people who have unknown immunization and have no record of immunization should receive PPSV23 vaccine. One-time revaccination 5 years after the first dose of PPSV23 is recommended for people aged 19-64 years who have chronic kidney failure, nephrotic  syndrome, asplenia, or immunocompromised conditions. People who received 1-2 doses of PPSV23 before age 71 years should receive another dose of PPSV23 vaccine at age 89 years or later if at least 5 years have passed since the previous dose. Doses of PPSV23 are not needed for people immunized with PPSV23 at or after age 84 years.  Hepatitis A vaccine. Adults who wish to be protected from this disease, have certain high-risk conditions, work with hepatitis A-infected animals, work in hepatitis A research labs, or travel to or work in countries with a high rate of hepatitis A should be immunized. Adults who were previously unvaccinated and who anticipate close contact with an international adoptee during the first 60 days after arrival in the [place] from a country with a high rate of hepatitis A should be immunized.  Hepatitis B vaccine. Adults should be immunized if they wish to be protected from this disease, have certain high-risk conditions, may be exposed to blood or other infectious body fluids, are household contacts or sex partners of hepatitis B positive people, are clients or workers in certain care facilities, or travel to or work in countries with a high rate of hepatitis B.  Preventive Service / Frequency  Ages 56 to 5  Blood pressure check.  Lipid and cholesterol check.  Hepatitis C blood test.** / For any individual with known risks for hepatitis C.  Skin self-exam. / Monthly.  Influenza vaccine. / Every year.  Tetanus, diphtheria, and acellular pertussis (Tdap, Td) vaccine.** / Consult your health care provider. 1 dose of Td every 10 years.  HPV vaccine. / 3 doses over 6 months, if 57 or younger.  Measles, mumps, rubella (MMR) vaccine.** / You need at least 1 dose of MMR if you were born in 1957 or later. You may also need a second dose.  Pneumococcal 13-valent conjugate (PCV13) vaccine.** / Consult your health care provider.  Pneumococcal polysaccharide (PPSV23)  vaccine.** / 1 to 2 doses if you smoke cigarettes or if you have certain conditions.  Meningococcal vaccine.** / 1 dose if you are age 39 to 62 years and a Market researcher living in a residence hall, or have one of several medical conditions. You may also need additional booster doses.  Hepatitis A vaccine.** / Consult your health care provider.  Hepatitis B vaccine.** / Consult your health care provider. +++++++++ Recommend Adult Low Dose Aspirin or  coated  Aspirin 81 mg daily  To reduce risk of Colon Cancer 20 %,  Skin Cancer 26 % ,  Melanoma 46%  and  Pancreatic cancer 60% ++++++++++++++++++ Vitamin D goal  is between 70-100.  Please make sure that you are taking your Vitamin D as directed.  It is very important as a natural anti-inflammatory  helping hair, skin, and nails, as well as reducing stroke and heart attack risk.  It helps your bones and helps with mood. It also decreases numerous cancer risks so please take it as directed.  Low Vit D is associated with a 200-300% higher risk for CANCER  and 200-300% higher risk for HEART   ATTACK  &  STROKE.   .....................................Marland Kitchen It is also associated with higher death rate at younger ages,  autoimmune diseases like Rheumatoid arthritis, Lupus, Multiple Sclerosis.    Also many other serious conditions, like depression, Alzheimer's Dementia, infertility, muscle aches, fatigue, fibromyalgia - just to name a few. +++++++++++++++++++++ Recommend the book "The END of DIETING" by Dr Excell Seltzer  & the book "The END of DIABETES " by Dr Excell Seltzer At [email or web address] - get book & Audio CD's    Being diabetic has a  300% increased risk for heart attack, stroke, cancer, and alzheimer- type vascular dementia. It is very important that you work harder with diet by avoiding all foods that are white. Avoid white rice (brown & wild rice is OK), white potatoes (sweetpotatoes in moderation is OK), White bread or wheat bread  or anything made out of white flour like bagels, donuts, rolls, buns, biscuits, cakes, pastries, cookies, pizza crust, and pasta (made from white flour & egg whites) - vegetarian pasta or spinach or wheat pasta is OK. Multigrain breads like Arnold's or [employer], or multigrain sandwich thins or flatbreads.  Diet, exercise and weight loss can reverse and cure diabetes in the early stages.  Diet, exercise and weight loss is very important in the control and prevention of complications of diabetes which affects every system in your body, ie. Brain - dementia/stroke, eyes - glaucoma/blindness, heart - heart attack/heart failure, kidneys - dialysis, stomach - gastric paralysis, intestines - malabsorption, nerves - severe painful neuritis, circulation - gangrene & loss of a leg(s), and finally cancer and Alzheimers.    I recommend avoid fried & greasy foods,  sweets/candy, white rice (brown or wild rice or Quinoa is OK), white potatoes (sweet potatoes are OK) - anything made from white flour - bagels, doughnuts, rolls, buns, biscuits,white and wheat breads, pizza crust and traditional pasta made of white flour & egg white(vegetarian pasta or spinach or wheat pasta is OK).  Multi-grain bread is OK - like multi-grain flat bread or sandwich thins. Avoid alcohol in excess. Exercise is also important.    Eat all the vegetables you want - avoid meat, especially red meat and dairy - especially cheese.  Cheese is the most concentrated form of trans-fats which is the worst thing to clog up our arteries. Veggie cheese is OK which can be found in the fresh produce section at [employer] or [employer] or [employer]  +++++++++++++++++++ DASH Eating Plan  DASH stands for "Dietary Approaches to Stop Hypertension."   The DASH eating plan is a healthy eating plan that has been shown to reduce high blood pressure (hypertension). Additional health benefits may include reducing the risk of type 2 diabetes mellitus, heart  disease, and stroke. The DASH eating plan may also help with weight loss. WHAT DO I NEED TO KNOW ABOUT THE DASH EATING PLAN? For the DASH eating plan, you will follow these general guidelines:  Choose foods with a percent daily value for sodium of less than 5% (as listed on the food label).  Use salt-free seasonings or herbs instead of table salt or sea salt.  Check with your health care provider or pharmacist before using salt substitutes.  Eat lower-sodium products, often labeled as "lower sodium" or "no salt added."  Eat fresh foods.  Eat more vegetables, fruits, and low-fat dairy products.  Choose whole grains. Look for the word "whole" as the first word in the ingredient list.  Choose fish   Limit sweets, desserts, sugars, and sugary drinks.  Choose heart-healthy fats.  Eat veggie cheese   Eat more home-cooked food and less restaurant, buffet, and fast food.  Limit fried foods.  Cook foods using methods other than frying.  Limit canned vegetables. If you do use them, rinse them well to decrease the sodium.  When eating at a restaurant, ask that your food be prepared with less salt, or no salt if possible.                      WHAT FOODS CAN I EAT? Read Dr Fara Olden Fuhrman's books on The End of Dieting & The End of Diabetes  Grains Whole grain or whole wheat bread. Brown rice. Whole grain or whole wheat pasta. Quinoa, bulgur, and whole grain cereals. Low-sodium cereals. Corn or whole wheat flour tortillas. Whole grain cornbread. Whole grain crackers. Low-sodium crackers.  Vegetables Fresh or frozen vegetables (raw, steamed, roasted, or grilled). Low-sodium or reduced-sodium tomato and vegetable juices. Low-sodium or reduced-sodium tomato sauce and paste. Low-sodium or reduced-sodium canned vegetables.   Fruits All fresh, canned (in natural juice), or frozen fruits.  Protein Products  All fish and seafood.  Dried beans, peas, or lentils. Unsalted nuts and seeds.  Unsalted canned beans.  Dairy Low-fat dairy products, such as skim or 1% milk, 2% or reduced-fat cheeses, low-fat ricotta or cottage cheese, or plain low-fat yogurt. Low-sodium or reduced-sodium cheeses.  Fats and Oils Tub margarines without trans fats. Light or reduced-fat mayonnaise and salad dressings (reduced sodium). Avocado. Safflower, olive, or canola oils. Natural peanut or almond butter.  Other Unsalted popcorn and pretzels. The items listed above may not be  a complete list of recommended foods or beverages. Contact your dietitian for more options.  +++++++++++++++++++  WHAT FOODS ARE NOT RECOMMENDED? Grains/ White flour or wheat flour White bread. White pasta. White rice. Refined cornbread. Bagels and croissants. Crackers that contain trans fat.  Vegetables  Creamed or fried vegetables. Vegetables in a . Regular canned vegetables. Regular canned tomato sauce and paste. Regular tomato and vegetable juices.  Fruits Dried fruits. Canned fruit in light or heavy syrup. Fruit juice.  Meat and Other Protein Products Meat in general - RED meat & White meat.  Fatty cuts of meat. Ribs, chicken wings, all processed meats as bacon, sausage, bologna, salami, fatback, hot dogs, bratwurst and packaged luncheon meats.  Dairy Whole or 2% milk, cream, half-and-half, and cream cheese. Whole-fat or sweetened yogurt. Full-fat cheeses or blue cheese. Non-dairy creamers and whipped toppings. Processed cheese, cheese spreads, or cheese curds.  Condiments Onion and garlic salt, seasoned salt, table salt, and sea salt. Canned and packaged gravies. Worcestershire sauce. Tartar sauce. Barbecue sauce. Teriyaki sauce. Soy sauce, including reduced sodium. Steak sauce. Fish sauce. Oyster sauce. Cocktail sauce. Horseradish. Ketchup and mustard. Meat flavorings and tenderizers. Bouillon cubes. Hot sauce. Tabasco sauce. Marinades. Taco seasonings. Relishes.  Fats and Oils Butter, stick margarine, lard,  shortening and bacon fat. Coconut, palm kernel, or palm oils. Regular salad dressings.  Pickles and olives. Salted popcorn and pretzels.  The items listed above may not be a complete list of foods and beverages to avoid.

## 2016-06-28 NOTE — Progress Notes (Addendum)
[facility]   Jason Guerrero, M.D.    Dyanne CarrelAmanda R. Steffanie Dunnollier, P.A.-C      Terri Piedraourtney Forcucci, P.A.-C  [facility]                [address]                [place], [place] [id number] Telephone [phone] Telefax [phone] Annual  Screening/Preventative Visit  & Comprehensive Evaluation & Examination     This very nice 35 y.o. SWM presents for a Screening/Preventative Visit & comprehensive evaluation and management of multiple medical co-morbidities.  Patient has been followed for Labile HTN, Prediabetes, Hyperlipidemia and Vitamin D Deficiency.     Labile HTN predates circa 2012 with BP's 120/88 and 136/89 and then 144/98 in 2013. Patient has hx/o palpitations and is on Corgard or concomitant treatment. Patient's BP has been controlled at home. Today's BP is 118/84 - at goal. Patient denies any cardiac symptoms as chest pain,  shortness of breath, dizziness or ankle swelling.     Patient's hyperlipidemia is controlled with diet and medications. Patient denies myalgias or other medication SE's. Last lipids were at goal albeit sl elevated Trig's: Lab Results  Component Value Date   CHOL 185 06/28/2016   HDL 64 06/28/2016   LDLCALC 91 06/28/2016   TRIG 151 (H) 06/28/2016   CHOLHDL 2.9 06/28/2016      Patient is screened proactively for prediabetes and patient denies reactive hypoglycemic symptoms, visual blurring, diabetic polys or paresthesias. Last A1c was normal & at goal: Lab Results  Component Value Date   HGBA1C 4.9 06/28/2016       Finally, patient has history of Vitamin D Deficiency of  "28" in 2012 and last vitamin D was still sl low: Lab Results  Component Value Date   VD25OH 4493 06/28/2016   Current Outpatient Prescriptions on File Prior to Visit  Medication Sig  . alprazolam (XANAX) 2 MG tablet TAKE 1/2-1 TABLET BY MOUTH 1 HOUR BEFORE BEDTIME.  Marland Kitchen. amitriptyline (ELAVIL) 50 MG tablet TAKE 1 TABLET BY  MOUTH 3 TIMES A DAY FOR CHRONIC NECK PAIN  . Cholecalciferol (VITAMIN D-3) 5000 UNITS TABS Take 1 tablet by mouth daily.   . Magnesium 250 MG TABS Take 1 tablet by mouth daily.   . nadolol (CORGARD) 80 MG tablet Take by mouth daily. Take 1/2 to 1 tablet by mouth daily for blood pressure and heart palpitations  . Omega-3 Fatty Acids (FISH OIL PO) Take 300 mg by mouth daily.  . pravastatin (PRAVACHOL) 40 MG tablet TAKE 1 TABLET BY MOUTH AT BEDTIME (INS PAYS FOR 30 DAYS)  . fenofibrate 160 MG tablet TAKE 1 TABLET (160 MG TOTAL) BY MOUTH DAILY. (Patient not taking: Reported on 06/28/2016)   No current facility-administered medications on file prior to visit.    Allergies  Allergen Reactions  . Sulfa Antibiotics    Past Medical History:  Diagnosis Date  . Anxiety   . HLD (hyperlipidemia)   . Insomnia   . Lumbago   . Tension headache   . Unspecified vitamin D deficiency    Health Maintenance  Topic Date Due  . INFLUENZA VACCINE  04/30/2016  . TETANUS/TDAP  10/22/2023  . HIV Screening  Completed   Immunization History  Administered Date(s) Administered  . Influenza, Seasonal, Injecte, Preservative Fre 09/07/2015  . PPD Test 06/01/2014, 06/06/2015, 06/28/2016  . Tdap 10/21/2013   No past surgical history on file. Family History  Problem Relation Age of Onset  . Hypertension Mother   . Diabetes Mother   . Heart disease Father   . Diabetes Father   . Colon cancer Maternal Grandfather   . Lung cancer Paternal Grandfather   . Breast cancer Maternal Grandmother   . Ovarian cancer Paternal Grandmother   . Hyperlipidemia Father    Social History   Social History  . Marital status: Single   Occupational History  . Single   Social History Main Topics  . Smoking status: Current Every Day Smoker    Packs/day: 1.00    Types: Cigarettes  . Smokeless tobacco: Not on file  . Alcohol use 0.0 oz/week     Comment: 14 beers per week  . Drug use: No  . Sexual activity: Not on file     ROS Constitutional: Denies fever, chills, weight loss/gain, headaches, insomnia,  night sweats or change in appetite. Does c/o fatigue. Eyes: Denies redness, blurred vision, diplopia, discharge, itchy or watery eyes.  ENT: Denies discharge, congestion, post nasal drip, epistaxis, sore throat, earache, hearing loss, dental pain, Tinnitus, Vertigo, Sinus pain or snoring.  Cardio: Denies chest pain, palpitations, irregular heartbeat, syncope, dyspnea, diaphoresis, orthopnea, PND, claudication or edema Respiratory: denies cough, dyspnea, DOE, pleurisy, hoarseness, laryngitis or wheezing.  Gastrointestinal: Denies dysphagia, heartburn, reflux, water brash, pain, cramps, nausea, vomiting, bloating, diarrhea, constipation, hematemesis, melena, hematochezia, jaundice or hemorrhoids Genitourinary: Denies dysuria, frequency, urgency, nocturia, hesitancy, discharge, hematuria or flank pain Musculoskeletal: Denies arthralgia, myalgia, stiffness, Jt. Swelling, pain, limp or strain/sprain. Denies Falls. Skin: Denies puritis, rash, hives, warts, acne, eczema or change in skin lesion Neuro: No weakness, tremor, incoordination, spasms, paresthesia or pain Psychiatric: Denies confusion, memory loss or sensory loss. Denies Depression. Endocrine: Denies change in weight, skin, hair change, nocturia, and paresthesia, diabetic polys, visual blurring or hyper / hypo glycemic episodes.  Heme/Lymph: No excessive bleeding, bruising or enlarged lymph nodes.  Physical Exam  BP 118/84   Pulse 80   Temp 97.5 F (36.4 C)   Resp 16   Ht 5' 10.5" (1.791 m)   Wt 135 lb 9.6 oz (61.5 kg)   BMI 19.18 kg/m   General Appearance: Well nourished, in no apparent distress.  Eyes: PERRLA, EOMs, conjunctiva no swelling or erythema, normal fundi and vessels. Sinuses: No frontal/maxillary tenderness ENT/Mouth: EACs patent / TMs  nl. Nares clear without erythema, swelling, mucoid exudates. Oral hygiene is good. No erythema,  swelling, or exudate. Tongue normal, non-obstructing. Tonsils not swollen or erythematous. Hearing normal.  Neck: Supple, thyroid normal. No bruits, nodes or JVD. Respiratory: Respiratory effort normal.  BS equal and clear bilateral without rales, rhonci, wheezing or stridor. Cardio: Heart sounds are normal with regular rate and rhythm and no murmurs, rubs or gallops. Peripheral pulses are normal and equal bilaterally without edema. No aortic or femoral bruits. Chest: symmetric with normal excursions and percussion.  Abdomen: Soft, with Nl bowel sounds. Nontender, no guarding, rebound, hernias, masses, or organomegaly.  Lymphatics: Non tender without lymphadenopathy.  Genitourinary: No hernias.Testes nl. DRE - deferred for age. Musculoskeletal: Full ROM all peripheral extremities, joint stability, 5/5 strength, and normal gait. Skin: Warm and dry without rashes, lesions, cyanosis, clubbing or  ecchymosis.  Neuro: Cranial nerves intact, reflexes equal bilaterally. Normal muscle tone, no cerebellar symptoms. Sensation intact.  Pysch: Alert and oriented X 3 with normal affect, insight and judgment appropriate.   Assessment and Plan  1. Annual Preventative/Screening Exam   - Microalbumin / creatinine urine ratio - EKG  12-Lead - POC Hemoccult Bld/Stl  - Urinalysis, Routine w reflex microscopic - Vitamin B12 - Iron and TIBC - Testosterone - CBC with Differential/Platelet - BASIC METABOLIC PANEL WITH GFR - Hepatic function panel - Magnesium - Lipid panel - TSH - Hemoglobin A1c - Insulin, random - VITAMIN D 25 Hydroxy  2. Essential hypertension  - Microalbumin / creatinine urine ratio - EKG 12-Lead - TSH  3. Hyperlipidemia  - EKG 12-Lead - Lipid panel - TSH  4. Abnormal glucose  - EKG 12-Lead - Hemoglobin A1c - Insulin, random  5. Vitamin D deficiency  - VITAMIN D 25 Hydroxy   6. Screening for rectal cancer  - POC Hemoccult Bld/Stl   7. Other fatigue  - Vitamin  B12 - Iron and TIBC - Testosterone - CBC with Differential/Platelet - TSH  8. Screening for ischemic heart disease  - EKG 12-Lead  9. Palpitations  - EKG 12-Lead  10. Medication management  - Urinalysis, Routine w reflex microscopic  - CBC with Differential/Platelet - BASIC METABOLIC PANEL WITH GFR - Hepatic function panel - Magnesium  11. Screening examination for pulmonary tuberculosis  - PPD      Continue prudent diet as discussed, weight control, BP monitoring, regular exercise, and medications as discussed.  Discussed med effects and SE's. Routine screening labs and tests as requested with regular follow-up as recommended. Over 40 minutes of exam, counseling, chart review and high complex critical decision making was performed

## 2016-06-29 LAB — URINALYSIS, ROUTINE W REFLEX MICROSCOPIC
Bilirubin Urine: NEGATIVE
GLUCOSE, UA: NEGATIVE
HGB URINE DIPSTICK: NEGATIVE
Ketones, ur: NEGATIVE
LEUKOCYTES UA: NEGATIVE
NITRITE: NEGATIVE
PH: 6 (ref 5.0–8.0)
Protein, ur: NEGATIVE
SPECIFIC GRAVITY, URINE: 1.02 (ref 1.001–1.035)

## 2016-06-29 LAB — MICROALBUMIN / CREATININE URINE RATIO
CREATININE, URINE: 239 mg/dL (ref 20–370)
MICROALB UR: 1.4 mg/dL
Microalb Creat Ratio: 6 mcg/mg creat (ref <30)

## 2016-06-29 LAB — HEMOGLOBIN A1C
Hgb A1c MFr Bld: 4.9 % (ref <5.7)
Mean Plasma Glucose: 94 mg/dL

## 2016-06-29 LAB — INSULIN, RANDOM: Insulin: 2.5 u[IU]/mL (ref 2.0–19.6)

## 2016-06-29 LAB — VITAMIN D 25 HYDROXY (VIT D DEFICIENCY, FRACTURES): Vit D, 25-Hydroxy: 93 ng/mL (ref 30–100)

## 2016-06-30 MED ORDER — PRAVASTATIN SODIUM 40 MG PO TABS: ORAL_TABLET | ORAL | 1 refills | Status: DC

## 2016-06-30 NOTE — Addendum Note (Signed)
Addended by: Lucky CowboyMCKEOWN, Janila Arrazola on: 06/30/2016 07:15 PM   Modules accepted: Orders

## 2016-07-11 ENCOUNTER — Other Ambulatory Visit: Payer: Self-pay | Admitting: *Deleted

## 2016-07-11 DIAGNOSIS — Z1212 Encounter for screening for malignant neoplasm of rectum: Secondary | ICD-10-CM

## 2016-07-11 DIAGNOSIS — Z0001 Encounter for general adult medical examination with abnormal findings: Secondary | ICD-10-CM

## 2016-07-11 LAB — POC HEMOCCULT BLD/STL (HOME/3-CARD/SCREEN)
Card #3 Fecal Occult Blood, POC: NEGATIVE
FECAL OCCULT BLD: NEGATIVE
Fecal Occult Blood, POC: NEGATIVE

## 2016-07-22 ENCOUNTER — Other Ambulatory Visit: Payer: Self-pay | Admitting: Internal Medicine

## 2016-07-22 DIAGNOSIS — G4700 Insomnia, unspecified: Secondary | ICD-10-CM

## 2016-09-27 ENCOUNTER — Ambulatory Visit: Payer: Self-pay | Admitting: Internal Medicine

## 2016-10-29 ENCOUNTER — Encounter: Payer: Self-pay | Admitting: Internal Medicine

## 2016-10-29 ENCOUNTER — Ambulatory Visit (INDEPENDENT_AMBULATORY_CARE_PROVIDER_SITE_OTHER): Payer: 59 | Admitting: Internal Medicine

## 2016-10-29 VITALS — BP 124/80 | HR 56 | Temp 98.4°F | Resp 16 | Ht 70.0 in | Wt 140.0 lb

## 2016-10-29 DIAGNOSIS — I10 Essential (primary) hypertension: Secondary | ICD-10-CM

## 2016-10-29 DIAGNOSIS — E782 Mixed hyperlipidemia: Secondary | ICD-10-CM

## 2016-10-29 DIAGNOSIS — R350 Frequency of micturition: Secondary | ICD-10-CM | POA: Diagnosis not present

## 2016-10-29 DIAGNOSIS — K219 Gastro-esophageal reflux disease without esophagitis: Secondary | ICD-10-CM

## 2016-10-29 DIAGNOSIS — R002 Palpitations: Secondary | ICD-10-CM

## 2016-10-29 MED ORDER — CIPROFLOXACIN HCL 500 MG PO TABS: 500.0000 mg | ORAL_TABLET | Freq: Two times a day (BID) | ORAL | 0 refills | Status: AC

## 2016-10-29 NOTE — Progress Notes (Signed)
Assessment and Plan:  Palpitations -cont medications -monitor blood pressure at home.  -Continue DASH diet.   -avoid alcohol as this will worsen palpitations -avoid caffeine -Reminder to go to the ER if any CP, SOB, nausea, dizziness, severe HA, changes vision/speech, left arm numbness and tingling, and jaw pain.  Cholesterol: -cont pravastatin -no insurance will avoid labs today -Continue diet and exercise.   Pre-diabetes: -Continue diet and exercise.  -Check A1C  Vitamin D Def: -continue medications.   Urinary frequency -UA -Urine Culture -cipro 500 mg BID X 3 weeks -possible that this could be prostatitis  GERD -nexium 20 mg QD x 2 weeks -avoid alcohol -avoid NSAIDs  Continue diet and meds as discussed. Further disposition pending results of labs.  HPI 36 y.o. male  presents for 3 month follow up with hypertension, hyperlipidemia, prediabetes and vitamin D.   His blood pressure has been controlled at home, today their BP is BP: 124/80.   He does not workout. He denies chest pain, shortness of breath, dizziness.   He is on cholesterol medication and denies myalgias. His cholesterol is at goal. The cholesterol last visit was:   Lab Results  Component Value Date   CHOL 185 06/28/2016   HDL 64 06/28/2016   LDLCALC 91 06/28/2016   TRIG 151 (H) 06/28/2016   CHOLHDL 2.9 06/28/2016     He has not been working on diet and exercise for prediabetes, and denies foot ulcerations, hyperglycemia, hypoglycemia , increased appetite, nausea, paresthesia of the feet, polydipsia, polyuria, visual disturbances, vomiting and weight loss. Last A1C in the office was:  Lab Results  Component Value Date   HGBA1C 4.9 06/28/2016    Patient is on Vitamin D supplement.  Lab Results  Component Value Date   VD25OH 7493 06/28/2016      Patient reports that for the last couple weeks he has been having some issues with urinary urgency and frequency.  He reports that sometimes he has a  severe urge when he sits and then he has the urgency to go and then he does not have very much urine coming out.  He reports that there is no dysuria, no odor to the urine, no hematuria.  No penile discharge, no rash, no new sexual partners.  No issues with urinary issues in the past.  He has had some slight pain in his back but this has not changed at all.  He reports that his bowel movements have not changed at all.  He does not drink any more than 1 cup of coffee.  No history of kidney stones.  He has been on amitriptylene for a while.  He is not taking any of the OTC meds.   He has been drinking beer.  He generally has a minimum of 4-5 beers per day.  At a max he drinks 10 beers per day.  He is drinking regular sized cans of light beer.  He does mix this with his xanax.  He has had some acid reflux after drinking water.  He is not taking antiinflammatory medications.    Current Medications:  Current Outpatient Prescriptions on File Prior to Visit  Medication Sig Dispense Refill  . alprazolam (XANAX) 2 MG tablet TAKE 1/2 TO 1 TABLET BY MOUTH 1 HOUR BEFORE BEDTIME 90 tablet 0  . amitriptyline (ELAVIL) 50 MG tablet TAKE 1 TABLET BY MOUTH 3 TIMES A DAY FOR CHRONIC NECK PAIN 270 tablet 1  . Cholecalciferol (VITAMIN D-3) 5000 UNITS TABS Take 1 tablet  by mouth daily.     . Magnesium 250 MG TABS Take 1 tablet by mouth daily.     . nadolol (CORGARD) 80 MG tablet TAKE 1/2 TO 1 TABLET BY MOUTH EVERY DAY FOR BLOOD PRESSURE AND HEART PALPITATIONS--INS COVERS 30 DAY 90 tablet 1  . Omega-3 Fatty Acids (FISH OIL PO) Take 300 mg by mouth daily.    . pravastatin (PRAVACHOL) 40 MG tablet Take 1 tablet at bedtime for Cholesterol 90 tablet 1   No current facility-administered medications on file prior to visit.     Medical History:  Past Medical History:  Diagnosis Date  . Anxiety   . HLD (hyperlipidemia)   . Insomnia   . Lumbago   . Tension headache   . Unspecified vitamin D deficiency     Allergies:   Allergies  Allergen Reactions  . Sulfa Antibiotics      Review of Systems:  Review of Systems  Constitutional: Negative for chills, fever and malaise/fatigue.  HENT: Negative for congestion, ear pain and sore throat.   Eyes: Negative.   Respiratory: Negative for cough, shortness of breath and wheezing.   Cardiovascular: Negative for chest pain, palpitations and leg swelling.  Gastrointestinal: Positive for heartburn. Negative for abdominal pain, blood in stool, constipation, diarrhea and melena.  Genitourinary: Positive for frequency and urgency. Negative for dysuria, flank pain and hematuria.  Skin: Negative.   Neurological: Negative for dizziness, sensory change, loss of consciousness and headaches.  Psychiatric/Behavioral: Negative for depression. The patient is not nervous/anxious and does not have insomnia.     Family history- Review and unchanged  Social history- Review and unchanged  Physical Exam: BP 124/80   Pulse (!) 56   Temp 98.4 F (36.9 C) (Temporal)   Resp 16   Ht 5\' 10"  (1.778 m)   Wt 140 lb (63.5 kg)   BMI 20.09 kg/m  Wt Readings from Last 3 Encounters:  10/29/16 140 lb (63.5 kg)  06/28/16 135 lb 9.6 oz (61.5 kg)  12/08/15 146 lb (66.2 kg)    General Appearance: Well nourished well developed, in no apparent distress. Eyes: PERRLA, EOMs, conjunctiva no swelling or erythema ENT/Mouth: Ear canals normal without obstruction, swelling, erythma, discharge.  TMs normal bilaterally.  Oropharynx moist, clear, without exudate, or postoropharyngeal swelling. Neck: Supple, thyroid normal,no cervical adenopathy  Respiratory: Respiratory effort normal, Breath sounds clear A&P without rhonchi, wheeze, or rale.  No retractions, no accessory usage. Cardio: RRR with no MRGs. Brisk peripheral pulses without edema.  Abdomen: Soft, + BS,  Non tender, no guarding, rebound, hernias, masses. Musculoskeletal: Full ROM, 5/5 strength, Normal gait Skin: Warm, dry without rashes,  lesions, ecchymosis.  Neuro: Awake and oriented X 3, Cranial nerves intact. Normal muscle tone, no cerebellar symptoms. Psych: Normal affect, Insight and Judgment appropriate.    Terri Piedra, PA-C 11:52 AM [facility]

## 2016-10-30 LAB — URINALYSIS, ROUTINE W REFLEX MICROSCOPIC
Bilirubin Urine: NEGATIVE
Glucose, UA: NEGATIVE
Hgb urine dipstick: NEGATIVE
Ketones, ur: NEGATIVE
Leukocytes, UA: NEGATIVE
Nitrite: NEGATIVE
Protein, ur: NEGATIVE
Specific Gravity, Urine: 1.02 (ref 1.001–1.035)
pH: 6 (ref 5.0–8.0)

## 2016-10-30 LAB — URINE CULTURE: Organism ID, Bacteria: NO GROWTH

## 2016-10-31 ENCOUNTER — Encounter: Payer: Self-pay | Admitting: Internal Medicine

## 2016-11-11 ENCOUNTER — Other Ambulatory Visit: Payer: Self-pay | Admitting: Internal Medicine

## 2016-11-11 DIAGNOSIS — G4700 Insomnia, unspecified: Secondary | ICD-10-CM

## 2016-12-19 ENCOUNTER — Other Ambulatory Visit: Payer: Self-pay | Admitting: *Deleted

## 2016-12-19 MED ORDER — NADOLOL 80 MG PO TABS: ORAL_TABLET | ORAL | 1 refills | Status: DC

## 2016-12-31 ENCOUNTER — Ambulatory Visit (INDEPENDENT_AMBULATORY_CARE_PROVIDER_SITE_OTHER): Payer: 59 | Admitting: Internal Medicine

## 2016-12-31 ENCOUNTER — Encounter: Payer: Self-pay | Admitting: Internal Medicine

## 2016-12-31 VITALS — BP 108/76 | HR 76 | Temp 97.3°F | Resp 16 | Ht 70.5 in | Wt 141.2 lb

## 2016-12-31 DIAGNOSIS — Z79899 Other long term (current) drug therapy: Secondary | ICD-10-CM | POA: Diagnosis not present

## 2016-12-31 DIAGNOSIS — I10 Essential (primary) hypertension: Secondary | ICD-10-CM

## 2016-12-31 DIAGNOSIS — R7309 Other abnormal glucose: Secondary | ICD-10-CM

## 2016-12-31 DIAGNOSIS — E559 Vitamin D deficiency, unspecified: Secondary | ICD-10-CM

## 2016-12-31 DIAGNOSIS — E782 Mixed hyperlipidemia: Secondary | ICD-10-CM

## 2016-12-31 LAB — BASIC METABOLIC PANEL WITH GFR
BUN: 9 mg/dL (ref 7–25)
CHLORIDE: 105 mmol/L (ref 98–110)
CO2: 24 mmol/L (ref 20–31)
CREATININE: 0.83 mg/dL (ref 0.60–1.35)
Calcium: 9.5 mg/dL (ref 8.6–10.3)
GFR, Est African American: >89 mL/min (ref >=60)
GFR, Est Non African American: >89 mL/min (ref >=60)
Glucose, Bld: 74 mg/dL (ref 65–99)
POTASSIUM: 4 mmol/L (ref 3.5–5.3)
Sodium: 141 mmol/L (ref 135–146)

## 2016-12-31 LAB — CBC WITH DIFFERENTIAL/PLATELET
BASOS ABS: 74 {cells}/uL (ref 0–200)
BASOS PCT: 1 %
EOS ABS: 370 {cells}/uL (ref 15–500)
Eosinophils Relative: 5 %
HEMATOCRIT: 47.6 % (ref 38.5–50.0)
Hemoglobin: 16.4 g/dL (ref 13.2–17.1)
LYMPHS PCT: 48 %
Lymphs Abs: 3552 cells/uL (ref 850–3900)
MCH: 31.5 pg (ref 27.0–33.0)
MCHC: 34.5 g/dL (ref 32.0–36.0)
MCV: 91.4 fL (ref 80.0–100.0)
MONO ABS: 666 {cells}/uL (ref 200–950)
MONOS PCT: 9 %
MPV: 9.4 fL (ref 7.5–12.5)
NEUTROS PCT: 37 %
Neutro Abs: 2738 cells/uL (ref 1500–7800)
Platelets: 201 10*3/uL (ref 140–400)
RBC: 5.21 MIL/uL (ref 4.20–5.80)
RDW: 13.5 % (ref 11.0–15.0)
WBC: 7.4 10*3/uL (ref 3.8–10.8)

## 2016-12-31 LAB — LIPID PANEL
CHOL/HDL RATIO: 3.9 ratio (ref <5.0)
Cholesterol: 213 mg/dL — ABNORMAL HIGH (ref <200)
HDL: 55 mg/dL (ref >40)
Triglycerides: 465 mg/dL — ABNORMAL HIGH (ref <150)

## 2016-12-31 LAB — HEPATIC FUNCTION PANEL
ALBUMIN: 4.3 g/dL (ref 3.6–5.1)
ALK PHOS: 59 U/L (ref 40–115)
ALT: 18 U/L (ref 9–46)
AST: 21 U/L (ref 10–40)
BILIRUBIN TOTAL: 0.3 mg/dL (ref 0.2–1.2)
Bilirubin, Direct: 0.1 mg/dL (ref <=0.2)
Indirect Bilirubin: 0.2 mg/dL (ref 0.2–1.2)
Total Protein: 7.3 g/dL (ref 6.1–8.1)

## 2016-12-31 LAB — TSH: TSH: 3.79 m[IU]/L (ref 0.40–4.50)

## 2016-12-31 NOTE — Progress Notes (Signed)
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      Patient is treated for HTN (2012) & BP has been controlled at home. Today's BP is at goal - 108/76. Patient has had no complaints of any cardiac type chest pain, palpitations, dyspnea/orthopnea/PND, dizziness, claudication, or dependent edema.     Hyperlipidemia is controlled with diet & meds. Patient denies myalgias or other med SE's. Last Lipids were at goal: Lab Results  Component Value Date   CHOL 185 06/28/2016   HDL 64 06/28/2016   LDLCALC 91 06/28/2016   TRIG 151 (H) 06/28/2016   CHOLHDL 2.9 06/28/2016      Also, the patient is monitored proactively for PreDiabetes and has had no symptoms of reactive hypoglycemia, diabetic polys, paresthesias or visual blurring.  Last A1c was at goal:  Lab Results  Component Value Date   HGBA1C 4.9 06/28/2016      Further, the patient also has history of Vitamin D Deficiency ("28" in 2012) and supplements vitamin D without any suspected side-effects. Last vitamin D was   Lab Results  Component Value Date   VD25OH 41 06/28/2016   Current Outpatient Prescriptions on File Prior to Visit  Medication Sig  . alprazolam (XANAX) 2 MG tablet TAKE 1/2 TO 1 TABLET BY MOUTH ONE HOUR BEFORE BEDTIME  . amitriptyline (ELAVIL) 50 MG tablet TAKE 1 TABLET BY MOUTH 3 TIMES A DAY FOR CHRONIC NECK PAIN  . Cholecalciferol (VITAMIN D-3) 5000 UNITS TABS Take 1 tablet by mouth daily.   . Magnesium 250 MG TABS Take 1 tablet by mouth daily.   . nadolol (CORGARD) 80 MG tablet TAKE 1/2 TO 1 TABLET BY MOUTH EVERY DAY FOR BLOOD PRESSURE AND HEART PALPITATIONS--INS COVERS 30 DAY  . Omega-3 Fatty Acids (FISH OIL PO) Take 300 mg by mouth daily.  . pravastatin (PRAVACHOL) 40 MG tablet Take 1 tablet at bedtime for Cholesterol   No current facility-administered medications on file prior to visit.    Allergies  Allergen Reactions  .  Sulfa Antibiotics    PMHx:   Past Medical History:  Diagnosis Date  . Anxiety   . HLD (hyperlipidemia)   . Insomnia   . Lumbago   . Tension headache   . Unspecified vitamin D deficiency    Immunization History  Administered Date(s) Administered  . Influenza, Seasonal, Injecte, Preservative Fre 09/07/2015  . PPD Test 06/01/2014, 06/06/2015, 06/28/2016  . Tdap 10/21/2013   FHx:    Reviewed / unchanged  SHx:    Reviewed / unchanged  Systems Review:  Constitutional: Denies fever, chills, wt changes, headaches, insomnia, fatigue, night sweats, change in appetite. Eyes: Denies redness, blurred vision, diplopia, discharge, itchy, watery eyes.  ENT: Denies discharge, congestion, post nasal drip, epistaxis, sore throat, earache, hearing loss, dental pain, tinnitus, vertigo, sinus pain, snoring.  CV: Denies chest pain, palpitations, irregular heartbeat, syncope, dyspnea, diaphoresis, orthopnea, PND, claudication or edema. Respiratory: denies cough, dyspnea, DOE, pleurisy, hoarseness, laryngitis, wheezing.  Gastrointestinal: Denies dysphagia, odynophagia, heartburn, reflux, water brash, abdominal pain or cramps, nausea, vomiting, bloating, diarrhea, constipation, hematemesis, melena, hematochezia  or hemorrhoids. Genitourinary: Denies dysuria, frequency, urgency, nocturia, hesitancy, discharge, hematuria or flank pain. Musculoskeletal: Denies arthralgias, myalgias, stiffness, jt. swelling, pain, limping or strain/sprain.  Skin: Denies pruritus, rash, hives, warts, acne, eczema or change in skin lesion(s). Neuro: No weakness, tremor, incoordination, spasms, paresthesia or pain. Psychiatric: Denies confusion, memory loss or  sensory loss. Endo: Denies change in weight, skin or hair change.  Heme/Lymph: No excessive bleeding, bruising or enlarged lymph nodes.  Physical Exam  BP 108/76   Pulse 76   Temp 97.3 F (36.3 C)   Resp 16   Ht 5' 10.5" (1.791 m)   Wt 141 lb 3.2 oz (64 kg)   BMI  19.97 kg/m   Appears well nourished, well groomed  and in no distress.  Eyes: PERRLA, EOMs, conjunctiva no swelling or erythema. Sinuses: No frontal/maxillary tenderness ENT/Mouth: EAC's clear, TM's nl w/o erythema, bulging. Nares clear w/o erythema, swelling, exudates. Oropharynx clear without erythema or exudates. Oral hygiene is good. Tongue normal, non obstructing. Hearing intact.  Neck: Supple. Thyroid nl. Car 2+/2+ without bruits, nodes or JVD. Chest: Respirations nl with BS clear & equal w/o rales, rhonchi, wheezing or stridor.  Cor: Heart sounds normal w/ regular rate and rhythm without sig. murmurs, gallops, clicks or rubs. Peripheral pulses normal and equal  without edema.  Abdomen: Soft & bowel sounds normal. Non-tender w/o guarding, rebound, hernias, masses or organomegaly.  Lymphatics: Unremarkable.  Musculoskeletal: Full ROM all peripheral extremities, joint stability, 5/5 strength and normal gait.  Skin: Warm, dry without exposed rashes, lesions or ecchymosis apparent.  Neuro: Cranial nerves intact, reflexes equal bilaterally. Sensory-motor testing grossly intact. Tendon reflexes grossly intact.  Pysch: Alert & oriented x 3.  Insight and judgement nl & appropriate. No ideations.  Assessment and Plan:  1. Essential hypertension  - Continue medication, monitor blood pressure at home.  - Continue DASH diet. Reminder to go to the ER if any CP,  SOB, nausea, dizziness, severe HA, changes vision/speech,  left arm numbness and tingling and jaw pain.  - CBC with Differential/Platelet - BASIC METABOLIC PANEL WITH GFR - Magnesium  2. Mixed hyperlipidemia  - Continue diet/meds, exercise,& lifestyle modifications.  - Continue monitor periodic cholesterol/liver & renal functions   - Hepatic function panel - Lipid panel - TSH  3. Abnormal glucose  - Continue diet, exercise, lifestyle modifications.  - Monitor appropriate labs.  - Hemoglobin A1c - Insulin, random  4.  Vitamin D deficiency  - Continue supplementation.  - VITAMIN D 25 Hydroxy  5. Medication management  - CBC with Differential/Platelet - BASIC METABOLIC PANEL WITH GFR - Hepatic function panel - Magnesium - Lipid panel - TSH - Hemoglobin A1c - Insulin, random - VITAMIN D 25 Hydroxy       Discussed  regular exercise, BP monitoring, weight control to achieve/maintain BMI less than 25 and discussed med and SE's. Recommended labs to assess and monitor clinical status with further disposition pending results of labs. Over 30 minutes of exam, counseling, chart review was performed.

## 2016-12-31 NOTE — Patient Instructions (Signed)

## 2017-01-01 LAB — INSULIN, RANDOM: INSULIN: 6.6 u[IU]/mL (ref 2.0–19.6)

## 2017-01-01 LAB — VITAMIN D 25 HYDROXY (VIT D DEFICIENCY, FRACTURES): Vit D, 25-Hydroxy: 61 ng/mL (ref 30–100)

## 2017-01-01 LAB — HEMOGLOBIN A1C
HEMOGLOBIN A1C: 4.7 % (ref <5.7)
Mean Plasma Glucose: 88 mg/dL

## 2017-01-01 LAB — MAGNESIUM: MAGNESIUM: 2.1 mg/dL (ref 1.5–2.5)

## 2017-01-23 ENCOUNTER — Other Ambulatory Visit: Payer: Self-pay | Admitting: Internal Medicine

## 2017-01-23 ENCOUNTER — Encounter: Payer: Self-pay | Admitting: Internal Medicine

## 2017-01-23 DIAGNOSIS — K219 Gastro-esophageal reflux disease without esophagitis: Secondary | ICD-10-CM

## 2017-01-23 MED ORDER — OMEPRAZOLE 40 MG PO CPDR: DELAYED_RELEASE_CAPSULE | ORAL | 1 refills | Status: DC

## 2017-01-27 ENCOUNTER — Other Ambulatory Visit: Payer: Self-pay | Admitting: Physician Assistant

## 2017-03-18 ENCOUNTER — Other Ambulatory Visit: Payer: Self-pay | Admitting: Physician Assistant

## 2017-03-18 DIAGNOSIS — G4700 Insomnia, unspecified: Secondary | ICD-10-CM

## 2017-03-18 NOTE — Telephone Encounter (Signed)
Please call Alpraz  

## 2017-05-01 NOTE — Progress Notes (Signed)
Assessment and Plan:  Hypertension -Continue medication, monitor blood pressure at home. Continue DASH diet.  Reminder to go to the ER if any CP, SOB, nausea, dizziness, severe HA, changes vision/speech, left arm numbness and tingling and jaw pain.  Cholesterol -Continue diet and exercise. Check cholesterol. He is not fasting today.   Vitamin D Def - check level and continue medications.   Anxiety-  Declines medications at this time  Insomnia ? Alcoholic induced sleep disturbance, will continue xanax for now but long discussion that we need to try other medications for sleep, this is not long term solution.    Continue diet and meds as discussed. Further disposition pending results of labs. Over 30 minutes of exam, counseling, chart review, and critical decision making was performed Future Appointments Date Time Provider Department Center  07/30/2017 10:00 AM Lucky CowboyMcKeown, William, MD [facility] None    HPI 36 y.o. male  presents for 3 month follow up on hypertension, cholesterol, prediabetes, and vitamin D deficiency.   His blood pressure has been controlled at home, today their BP is BP: 110/80  He does not workout. He denies chest pain, shortness of breath, dizziness. Has history of SVT, on naldolol 40mg  BID and follows with Dr. Delton SeeNelson.  He has some chest pain without fast heart rates mainly at work and associated with anxiety.  Admits to drinking 5-10 beers daily, trying to decrease amount.  He is on xanax for sleep, has tried and failed trazodone, valium, amitriptyline,   He is on cholesterol medication, pravastatin 40 and denies myalgias. He can not tolerate fish oil due to constipation.  His cholesterol is not at goal. The cholesterol last visit was:   Lab Results  Component Value Date   CHOL 213 (H) 12/31/2016   HDL 55 12/31/2016   LDLCALC NOT CALC 12/31/2016   TRIG 465 (H) 12/31/2016   CHOLHDL 3.9 12/31/2016   Last W0JA1C in the office was:  Lab Results  Component Value  Date   HGBA1C 4.7 12/31/2016  Patient is on Vitamin D supplement.   Lab Results  Component Value Date   VD25OH 61 12/31/2016     Current Medications:  Current Outpatient Prescriptions on File Prior to Visit  Medication Sig Dispense Refill  . alprazolam (XANAX) 2 MG tablet TAKE 1 TABLET BY MOUTH 1 HR BEFORE AT BEDTIME 90 tablet 0  . amitriptyline (ELAVIL) 50 MG tablet TAKE 1 TABLET BY MOUTH 3 TIMES A DAY FOR CHRONIC NECK PAIN 270 tablet 1  . Cholecalciferol (VITAMIN D-3) 5000 UNITS TABS Take 1 tablet by mouth daily.     . Magnesium 250 MG TABS Take 1 tablet by mouth daily.     . nadolol (CORGARD) 80 MG tablet TAKE 1/2 TO 1 TABLET BY MOUTH EVERY DAY FOR BLOOD PRESSURE AND HEART PALPITATIONS--INS COVERS 30 DAY 90 tablet 1  . Omega-3 Fatty Acids (FISH OIL PO) Take 300 mg by mouth daily.    Marland Kitchen. omeprazole (PRILOSEC) 40 MG capsule Take 1 capsule daily for heartburn & acid reflux 90 capsule 1  . pravastatin (PRAVACHOL) 40 MG tablet Take 1 tablet at bedtime for Cholesterol 90 tablet 1   No current facility-administered medications on file prior to visit.    Medical History:  Past Medical History:  Diagnosis Date  . Anxiety   . HLD (hyperlipidemia)   . Insomnia   . Lumbago   . Tension headache   . Unspecified vitamin D deficiency    Allergies:  Allergies  Allergen Reactions  .  Sulfa Antibiotics      Review of Systems:  Review of Systems  Constitutional: Positive for malaise/fatigue. Negative for chills, diaphoresis, fever and weight loss.  HENT: Negative for congestion, ear discharge, ear pain, hearing loss, nosebleeds, sore throat and tinnitus.   Respiratory: Negative.  Negative for stridor.   Cardiovascular: Negative.   Gastrointestinal: Positive for heartburn (better with prilosec). Negative for abdominal pain, blood in stool, constipation, diarrhea, melena, nausea and vomiting.  Genitourinary: Negative.   Musculoskeletal: Negative for back pain, falls, joint pain, myalgias and  neck pain.  Neurological: Negative for dizziness, tingling, tremors, sensory change, speech change, focal weakness, seizures, loss of consciousness, weakness and headaches.  Psychiatric/Behavioral: Negative for depression, hallucinations, memory loss, substance abuse and suicidal ideas. The patient is not nervous/anxious and does not have insomnia.     Family history- Review and unchanged Social history- Review and unchanged Physical Exam: BP 110/80   Pulse 77   Temp 98.1 F (36.7 C)   Resp 14   Ht 5' 10.5" (1.791 m)   Wt 138 lb 3.2 oz (62.7 kg)   SpO2 97%   BMI 19.55 kg/m  Wt Readings from Last 3 Encounters:  05/02/17 138 lb 3.2 oz (62.7 kg)  12/31/16 141 lb 3.2 oz (64 kg)  10/29/16 140 lb (63.5 kg)   General Appearance: Well nourished, in no apparent distress. Eyes: PERRLA, EOMs, conjunctiva no swelling or erythema Sinuses: No Frontal/maxillary tenderness ENT/Mouth: Ext aud canals clear, TMs without erythema, bulging. No erythema, swelling, or exudate on post pharynx.  Tonsils not swollen or erythematous. Hearing normal. + TMJ and crowded mouth.  Neck: Supple, thyroid normal.  Respiratory: Respiratory effort normal, BS equal bilaterally without rales, rhonchi, wheezing or stridor.  Cardio: RRR with no MRGs. Brisk peripheral pulses without edema.  Abdomen: Soft, + BS,  Non tender, no guarding, rebound, hernias, masses. Lymphatics: Non tender without lymphadenopathy.  Musculoskeletal: Full ROM, 5/5 strength, Normal gait. normal range of motion of neck, without spinous process tenderness, with paraspinal muscle tenderness the left side, normal sensation, reflexes, and pulses distal. Skin: Warm, dry without rashes, lesions, ecchymosis.  Neuro: Cranial nerves intact. Normal muscle tone, no cerebellar symptoms. Psych: Awake and oriented X 3, normal affect, Insight and Judgment appropriate.    Quentin MullingAmanda Collier, PA-C 11:46 AM [facility]

## 2017-05-02 ENCOUNTER — Encounter: Payer: Self-pay | Admitting: Physician Assistant

## 2017-05-02 ENCOUNTER — Ambulatory Visit (INDEPENDENT_AMBULATORY_CARE_PROVIDER_SITE_OTHER): Payer: 59 | Admitting: Physician Assistant

## 2017-05-02 VITALS — BP 110/80 | HR 77 | Temp 98.1°F | Resp 14 | Ht 70.5 in | Wt 138.2 lb

## 2017-05-02 DIAGNOSIS — E782 Mixed hyperlipidemia: Secondary | ICD-10-CM | POA: Diagnosis not present

## 2017-05-02 DIAGNOSIS — Z79899 Other long term (current) drug therapy: Secondary | ICD-10-CM

## 2017-05-02 DIAGNOSIS — E559 Vitamin D deficiency, unspecified: Secondary | ICD-10-CM | POA: Diagnosis not present

## 2017-05-02 DIAGNOSIS — I10 Essential (primary) hypertension: Secondary | ICD-10-CM | POA: Diagnosis not present

## 2017-05-02 DIAGNOSIS — F17200 Nicotine dependence, unspecified, uncomplicated: Secondary | ICD-10-CM

## 2017-05-02 LAB — CBC WITH DIFFERENTIAL/PLATELET
Basophils Absolute: 65 cells/uL (ref 0–200)
Basophils Relative: 1 %
EOS PCT: 4 %
Eosinophils Absolute: 260 cells/uL (ref 15–500)
HCT: 48 % (ref 38.5–50.0)
HEMOGLOBIN: 16.7 g/dL (ref 13.2–17.1)
LYMPHS ABS: 2340 {cells}/uL (ref 850–3900)
Lymphocytes Relative: 36 %
MCH: 32.1 pg (ref 27.0–33.0)
MCHC: 34.8 g/dL (ref 32.0–36.0)
MCV: 92.1 fL (ref 80.0–100.0)
MPV: 9.5 fL (ref 7.5–12.5)
Monocytes Absolute: 585 cells/uL (ref 200–950)
Monocytes Relative: 9 %
NEUTROS ABS: 3250 {cells}/uL (ref 1500–7800)
NEUTROS PCT: 50 %
PLATELETS: 194 10*3/uL (ref 140–400)
RBC: 5.21 MIL/uL (ref 4.20–5.80)
RDW: 13.5 % (ref 11.0–15.0)
WBC: 6.5 10*3/uL (ref 3.8–10.8)

## 2017-05-02 NOTE — Patient Instructions (Addendum)
Look up CBT and DBT therapy on google/youtube  GETTING OFF OF PPI's    Nexium/protonix/prilosec/Omeprazole/Dexilant/Aciphex are called PPI's, they are great at healing your stomach but should only be taken for a short period of time.     Recent studies have shown that taken for a long time they  can increase the risk of osteoporosis (weakening of your bones), pneumonia, low magnesium, restless legs, Cdiff (infection that causes diarrhea), DEMENTIA and most recently kidney damage / disease / insufficiency.     Due to this information we want to try to stop the PPI but if you try to stop it abruptly this can cause rebound acid and worsening symptoms.   So this is how we want you to get off the PPI:  - Start taking the nexium/protonix/prilosec/PPI  every other day with  zantac (ranitidine) 2 x a day for 2-4 weeks - some people stay on this dosage and can not taper off further. Our main goal is to limit the dosage and amount you are taking so if you need to stay on this dose.   - then decrease the PPI to every 3 days while taking the zantac (ranitidine) 300mg  twice a day the other  days for 2-4  Weeks  - then you can try the zantac (ranitidine) 300mg  once at night or up to 2 x day as needed.  - you can continue on this once at night or stop all together  - Avoid alcohol, spicy foods, NSAIDS (aleve, ibuprofen) at this time. See foods below.   +++++++++++++++++++++++++++++++++++++++++++  Food Choices for Gastroesophageal Reflux Disease  When you have gastroesophageal reflux disease (GERD), the foods you eat and your eating habits are very important. Choosing the right foods can help ease the discomfort of GERD. WHAT GENERAL GUIDELINES DO I NEED TO FOLLOW?  Choose fruits, vegetables, whole grains, low-fat dairy products, and low-fat meat, fish, and poultry.  Limit fats such as oils, salad dressings, butter, nuts, and avocado.  Keep a food diary to identify foods that cause  symptoms.  Avoid foods that cause reflux. These may be different for different people.  Eat frequent small meals instead of three large meals each day.  Eat your meals slowly, in a relaxed setting.  Limit fried foods.  Cook foods using methods other than frying.  Avoid drinking alcohol.  Avoid drinking large amounts of liquids with your meals.  Avoid bending over or lying down until 2-3 hours after eating.   WHAT FOODS ARE NOT RECOMMENDED? The following are some foods and drinks that may worsen your symptoms:  Vegetables Tomatoes. Tomato juice. Tomato and spaghetti sauce. Chili peppers. Onion and garlic. Horseradish. Fruits Oranges, grapefruit, and lemon (fruit and juice). Meats High-fat meats, fish, and poultry. This includes hot dogs, ribs, ham, sausage, salami, and bacon. Dairy Whole milk and chocolate milk. Sour cream. Cream. Butter. Ice cream. Cream cheese.  Beverages Coffee and tea, with or without caffeine. Carbonated beverages or energy drinks. Condiments Hot sauce. Barbecue sauce.  Sweets/Desserts Chocolate and cocoa. Donuts. Peppermint and spearmint. Fats and Oils High-fat foods, including [place] fries and potato chips. Other Vinegar. Strong spices, such as black pepper, white pepper, red pepper, cayenne, curry powder, cloves, ginger, and chili powder. Nexium/protonix/prilosec are called PPI's, they are great at healing your stomach but should only be taken for a short period of time.   New guidelines suggest the benzodiazepines are best short term, with prolonged use they lead to physical and psychological dependence. In addition, evidence  suggest that for insomnia the effectiveness wanes in 4 weeks and the risks out weight their benefits. Use of these agents have been associated with dementia, falls, motor vehicle accidents and physical addiction. Decreasing these medication have been proven to show improvements in cognition, alertness, decrease of falls and  daytime sedation.   We will start a slow taper, symptoms of withdrawal include, insomnia, anxiety, irritability, sweating and stomach or intestinal symptoms like diarrhea or nausea.

## 2017-05-03 LAB — BASIC METABOLIC PANEL WITH GFR
BUN: 10 mg/dL (ref 7–25)
CALCIUM: 9.8 mg/dL (ref 8.6–10.3)
CO2: 26 mmol/L (ref 20–31)
Chloride: 103 mmol/L (ref 98–110)
Creat: 0.94 mg/dL (ref 0.60–1.35)
GFR, Est African American: >89 mL/min (ref >=60)
Glucose, Bld: 90 mg/dL (ref 65–99)
Potassium: 4.2 mmol/L (ref 3.5–5.3)
SODIUM: 140 mmol/L (ref 135–146)

## 2017-05-03 LAB — HEPATIC FUNCTION PANEL
ALT: 15 U/L (ref 9–46)
AST: 19 U/L (ref 10–40)
Albumin: 4.3 g/dL (ref 3.6–5.1)
Alkaline Phosphatase: 47 U/L (ref 40–115)
BILIRUBIN DIRECT: 0.1 mg/dL (ref <=0.2)
BILIRUBIN INDIRECT: 0.6 mg/dL (ref 0.2–1.2)
BILIRUBIN TOTAL: 0.7 mg/dL (ref 0.2–1.2)
Total Protein: 7.2 g/dL (ref 6.1–8.1)

## 2017-05-03 LAB — LIPID PANEL
CHOL/HDL RATIO: 3.3 ratio (ref <5.0)
Cholesterol: 186 mg/dL (ref <200)
HDL: 56 mg/dL (ref >40)
LDL CALC: 97 mg/dL (ref <100)
TRIGLYCERIDES: 167 mg/dL — AB (ref <150)
VLDL: 33 mg/dL — AB (ref <30)

## 2017-05-04 ENCOUNTER — Other Ambulatory Visit: Payer: Self-pay | Admitting: Physician Assistant

## 2017-05-26 ENCOUNTER — Other Ambulatory Visit: Payer: Self-pay | Admitting: Physician Assistant

## 2017-06-25 ENCOUNTER — Other Ambulatory Visit: Payer: Self-pay | Admitting: Internal Medicine

## 2017-06-25 DIAGNOSIS — G4700 Insomnia, unspecified: Secondary | ICD-10-CM

## 2017-06-25 NOTE — Telephone Encounter (Signed)
Please call Alpraz  

## 2017-07-14 ENCOUNTER — Other Ambulatory Visit: Payer: Self-pay | Admitting: Internal Medicine

## 2017-07-17 ENCOUNTER — Other Ambulatory Visit: Payer: Self-pay

## 2017-07-17 MED ORDER — AMITRIPTYLINE HCL 50 MG PO TABS: ORAL_TABLET | ORAL | 1 refills | Status: DC

## 2017-07-18 ENCOUNTER — Other Ambulatory Visit: Payer: Self-pay | Admitting: Internal Medicine

## 2017-07-18 DIAGNOSIS — K219 Gastro-esophageal reflux disease without esophagitis: Secondary | ICD-10-CM

## 2017-07-29 ENCOUNTER — Encounter: Payer: Self-pay | Admitting: Internal Medicine

## 2017-07-29 NOTE — Progress Notes (Signed)
[facility]   Lucky Cowboy, M.D.     Dyanne Carrel. Steffanie Dunn, P.A.-C Judd Gaudier, DNP [facility]                [address]                [place], [place]. [address] Telephone [phone] Telefax [phone] Annual  Screening/Preventative Visit  & Comprehensive Evaluation & Examination     This very nice 36 y.o. single WM presents for a Screening/Preventative Visit & comprehensive evaluation and management of multiple medical co-morbidities.  Patient has been followed for HTN, Prediabetes, Hyperlipidemia and Vitamin D Deficiency. Patient also has GERD controlled with his meds.      HTN predates circa 2012. Patient's BP has been controlled at home.  Today's BP is at goal: 102/74. Patient denies any cardiac symptoms as chest pain, palpitations, shortness of breath, dizziness or ankle swelling.     Patient's hyperlipidemia is controlled with diet and medications. Patient denies myalgias or other medication SE's. Last lipids were at goal: Lab Results  Component Value Date   CHOL 186 05/02/2017   HDL 56 05/02/2017   LDLCALC 97 05/02/2017   TRIG 167 (H) 05/02/2017   CHOLHDL 3.3 05/02/2017      Patient is monitored expectantly for  prediabetes and patient denies reactive hypoglycemic symptoms, visual blurring, diabetic polys or paresthesias. Last A1c was at goal: Lab Results  Component Value Date   HGBA1C 4.7 12/31/2016       Finally, patient has history of Vitamin D Deficiency ("28" in 2012) and last vitamin D was at goal: Lab Results  Component Value Date   VD25OH 61 12/31/2016   Current Outpatient Prescriptions on File Prior to Visit  Medication Sig  . alprazolam (XANAX) 2 MG tablet TAKE 1 TABLET BY MOUTH 1 HOUR BEFORE BEDTIME  . amitriptyline (ELAVIL) 50 MG tablet TAKE 1 TABLET BY MOUTH 3 TIMES A DAY FOR CHRONIC NECK PAIN  . Cholecalciferol (VITAMIN D-3) 5000 UNITS TABS Take 1 tablet by mouth daily.   .  Magnesium 250 MG TABS Take 1 tablet by mouth daily.   . nadolol (CORGARD) 80 MG tablet TAKE 1/2 TO 1 TABLET BY MOUTH EVERY DAY FOR BLOOD PRESSURE AND HEART PALPITATIONS  . Omega-3 Fatty Acids (FISH OIL PO) Take 300 mg by mouth daily.  Marland Kitchen omeprazole (PRILOSEC) 40 MG capsule TAKE 1 CAPSULE DAILY FOR HEARTBURN & ACID REFLUX  . pravastatin (PRAVACHOL) 40 MG tablet TAKE 1 TABLET BY MOUTH AT BEDTIME   No current facility-administered medications on file prior to visit.    Allergies  Allergen Reactions  . Sulfa Antibiotics    Past Medical History:  Diagnosis Date  . Anxiety   . HLD (hyperlipidemia)   . Insomnia   . Lumbago   . Tension headache   . Unspecified vitamin D deficiency    Health Maintenance  Topic Date Due  . INFLUENZA VACCINE  04/30/2017  . TETANUS/TDAP  10/22/2023  . HIV Screening  Completed   Immunization History  Administered Date(s) Administered  . Influenza Inj Mdck Quad With Preservative 07/30/2017  . Influenza, Seasonal, Injecte, Preservative Fre 09/07/2015  . PPD Test 06/01/2014, 06/06/2015, 06/28/2016, 07/30/2017  . Tdap 10/21/2013   History reviewed. No pertinent surgical history. Family History  Problem Relation Age of Onset  . Hypertension Mother   . Diabetes Mother   . Heart disease Father   . Diabetes Father   . Colon  cancer Maternal Grandfather   . Lung cancer Paternal Grandfather   . Breast cancer Maternal Grandmother   . Ovarian cancer Paternal Grandmother   . Hyperlipidemia Father    Social History   Social History  . Marital status: Single    Spouse name: N/A  . Number of children: N/A  . Years of education: N/A   Occupational History  . Not on file.   Social History Main Topics  . Smoking status: Current Every Day Smoker    Packs/day: 1.00    Types: Cigarettes  . Smokeless tobacco: Never Used  . Alcohol use 0.0 oz/week     Comment: 14 beers per week  . Drug use: No  . Sexual activity: Not on file   Other Topics Concern  .  Not on file   Social History Narrative  . No narrative on file    ROS Constitutional: Denies fever, chills, weight loss/gain, headaches, insomnia,  night sweats or change in appetite. Does c/o fatigue. Eyes: Denies redness, blurred vision, diplopia, discharge, itchy or watery eyes.  ENT: Denies discharge, congestion, post nasal drip, epistaxis, sore throat, earache, hearing loss, dental pain, Tinnitus, Vertigo, Sinus pain or snoring.  Cardio: Denies chest pain,  syncope, dyspnea, diaphoresis, orthopnea, PND, claudication or edema. He has had infreq palpitations. Respiratory: denies cough, dyspnea, DOE, pleurisy, hoarseness, laryngitis or wheezing.  Gastrointestinal: Denies dysphagia, heartburn, reflux, water brash, pain, cramps, nausea, vomiting, bloating, diarrhea, constipation, hematemesis, melena, hematochezia, jaundice or hemorrhoids Genitourinary: Denies dysuria, frequency, urgency, nocturia, hesitancy, discharge, hematuria or flank pain Musculoskeletal: Denies arthralgia, myalgia, stiffness, Jt. Swelling, pain, limp or strain/sprain. Denies Falls. Skin: Denies puritis, rash, hives, warts, acne, eczema or change in skin lesion Neuro: No weakness, tremor, incoordination, spasms, paresthesia or pain Psychiatric: Denies confusion, memory loss or sensory loss. Denies Depression. Endocrine: Denies change in weight, skin, hair change, nocturia, and paresthesia, diabetic polys, visual blurring or hyper / hypo glycemic episodes.  Heme/Lymph: No excessive bleeding, bruising or enlarged lymph nodes.  Physical Exam  BP 102/74   Pulse 80   Temp (!) 97 F (36.1 C)   Resp 16   Ht 5\' 10"  (1.778 m)   Wt 135 lb 12.8 oz (61.6 kg)   BMI 19.49 kg/m   General Appearance: Well nourished and well groomed and in no apparent distress.  Eyes: PERRLA, EOMs, conjunctiva no swelling or erythema, normal fundi and vessels. Sinuses: No frontal/maxillary tenderness ENT/Mouth: EACs patent / TMs  nl. Nares  clear without erythema, swelling, mucoid exudates. Oral hygiene is good. No erythema, swelling, or exudate. Tongue normal, non-obstructing. Tonsils not swollen or erythematous. Hearing normal.  Neck: Supple, thyroid normal. No bruits, nodes or JVD. Respiratory: Respiratory effort normal.  BS equal and clear bilateral without rales, rhonci, wheezing or stridor. Cardio: Heart sounds are normal with regular rate and rhythm and no murmurs, rubs or gallops. Peripheral pulses are normal and equal bilaterally without edema. No aortic or femoral bruits. Chest: symmetric with normal excursions and percussion.  Abdomen: Soft, with Nl bowel sounds. Nontender, no guarding, rebound, hernias, masses, or organomegaly.  Lymphatics: Non tender without lymphadenopathy.  Genitourinary: No hernias.Testes nl. DRE - prostate nl for age - smooth & firm w/o nodules. Musculoskeletal: Full ROM all peripheral extremities, joint stability, 5/5 strength, and normal gait. Skin: Warm and dry without rashes, lesions, cyanosis, clubbing or  ecchymosis.  Neuro: Cranial nerves intact, reflexes equal bilaterally. Normal muscle tone, no cerebellar symptoms. Sensation intact.  Pysch: Alert and oriented X 3 with  normal affect, insight and judgment appropriate.   Assessment and Plan  1. Annual Preventative/Screening Exam   2. Essential hypertension  - EKG 12-Lead - Urinalysis, Routine w reflex microscopic - Microalbumin / creatinine urine ratio - CBC with Differential/Platelet - BASIC METABOLIC PANEL WITH GFR - Magnesium - TSH  3. Hyperlipidemia, mixed  - EKG 12-Lead - Hepatic function panel - Lipid panel - TSH  4. Abnormal glucose  - Hemoglobin A1c - Insulin, random  5. Vitamin D deficiency  - VITAMIN D 25 Hydroxy  6. Screening for rectal cancer  - POC Hemoccult Bld/Stl   7. Screening examination for pulmonary tuberculosis  - PPD  8. Palpitations  - EKG 12-Lead  9. Fatigue, unspecified type  -  Iron,Total/Total Iron Binding Cap - Vitamin B12 - Testosterone - CBC with Differential/Platelet - TSH  10. Medication management  - Urinalysis, Routine w reflex microscopic - Microalbumin / creatinine urine ratio - CBC with Differential/Platelet  11. Need for immunization against influenza  - FLU VACCINE MDCK QUAD W/Preservative          Patient was counseled in prudent diet, weight control to achieve/maintain BMI less than 25, BP monitoring, regular exercise and medications as discussed.  Discussed med effects and SE's. Routine screening labs and tests as requested with regular follow-up as recommended. Over 40 minutes of exam, counseling, chart review and high complex critical decision making was performed

## 2017-07-29 NOTE — Patient Instructions (Signed)

## 2017-07-30 ENCOUNTER — Ambulatory Visit (INDEPENDENT_AMBULATORY_CARE_PROVIDER_SITE_OTHER): Payer: 59 | Admitting: Internal Medicine

## 2017-07-30 ENCOUNTER — Other Ambulatory Visit: Payer: Self-pay | Admitting: Internal Medicine

## 2017-07-30 VITALS — BP 102/74 | HR 80 | Temp 97.0°F | Resp 16 | Ht 70.0 in | Wt 135.8 lb

## 2017-07-30 DIAGNOSIS — Z111 Encounter for screening for respiratory tuberculosis: Secondary | ICD-10-CM | POA: Diagnosis not present

## 2017-07-30 DIAGNOSIS — Z23 Encounter for immunization: Secondary | ICD-10-CM

## 2017-07-30 DIAGNOSIS — Z1212 Encounter for screening for malignant neoplasm of rectum: Secondary | ICD-10-CM

## 2017-07-30 DIAGNOSIS — Z0000 Encounter for general adult medical examination without abnormal findings: Secondary | ICD-10-CM

## 2017-07-30 DIAGNOSIS — I10 Essential (primary) hypertension: Secondary | ICD-10-CM | POA: Diagnosis not present

## 2017-07-30 DIAGNOSIS — E559 Vitamin D deficiency, unspecified: Secondary | ICD-10-CM

## 2017-07-30 DIAGNOSIS — R7309 Other abnormal glucose: Secondary | ICD-10-CM

## 2017-07-30 DIAGNOSIS — Z136 Encounter for screening for cardiovascular disorders: Secondary | ICD-10-CM

## 2017-07-30 DIAGNOSIS — Z79899 Other long term (current) drug therapy: Secondary | ICD-10-CM

## 2017-07-30 DIAGNOSIS — R002 Palpitations: Secondary | ICD-10-CM

## 2017-07-30 DIAGNOSIS — E782 Mixed hyperlipidemia: Secondary | ICD-10-CM

## 2017-07-30 DIAGNOSIS — Z0001 Encounter for general adult medical examination with abnormal findings: Secondary | ICD-10-CM

## 2017-07-30 DIAGNOSIS — R5383 Other fatigue: Secondary | ICD-10-CM

## 2017-07-30 MED ORDER — RANITIDINE HCL 150 MG PO TABS: ORAL_TABLET | ORAL | Status: DC

## 2017-07-30 MED ORDER — GABAPENTIN 100 MG PO CAPS: ORAL_CAPSULE | ORAL | 5 refills | Status: DC

## 2017-07-31 LAB — URINALYSIS, ROUTINE W REFLEX MICROSCOPIC
BILIRUBIN URINE: NEGATIVE
GLUCOSE, UA: NEGATIVE
HGB URINE DIPSTICK: NEGATIVE
KETONES UR: NEGATIVE
Leukocytes, UA: NEGATIVE
NITRITE: NEGATIVE
PH: 5.5 (ref 5.0–8.0)
Protein, ur: NEGATIVE
Specific Gravity, Urine: 1.007 (ref 1.001–1.03)

## 2017-07-31 LAB — HEPATIC FUNCTION PANEL
AG Ratio: 1.6 (calc) (ref 1.0–2.5)
ALKALINE PHOSPHATASE (APISO): 45 U/L (ref 40–115)
ALT: 16 U/L (ref 9–46)
AST: 19 U/L (ref 10–40)
Albumin: 4.4 g/dL (ref 3.6–5.1)
BILIRUBIN DIRECT: 0.1 mg/dL (ref 0.0–0.2)
BILIRUBIN INDIRECT: 0.5 mg/dL (ref 0.2–1.2)
GLOBULIN: 2.8 g/dL (ref 1.9–3.7)
TOTAL PROTEIN: 7.2 g/dL (ref 6.1–8.1)
Total Bilirubin: 0.6 mg/dL (ref 0.2–1.2)

## 2017-07-31 LAB — BASIC METABOLIC PANEL WITH GFR
BUN: 9 mg/dL (ref 7–25)
CHLORIDE: 103 mmol/L (ref 98–110)
CO2: 29 mmol/L (ref 20–32)
Calcium: 9.5 mg/dL (ref 8.6–10.3)
Creat: 0.86 mg/dL (ref 0.60–1.35)
GFR, EST AFRICAN AMERICAN: 129 mL/min/{1.73_m2} (ref >=60)
GFR, Est Non African American: 112 mL/min/{1.73_m2} (ref >=60)
GLUCOSE: 78 mg/dL (ref 65–99)
POTASSIUM: 4 mmol/L (ref 3.5–5.3)
SODIUM: 138 mmol/L (ref 135–146)

## 2017-07-31 LAB — CBC WITH DIFFERENTIAL/PLATELET
BASOS PCT: 1.1 %
Basophils Absolute: 83 cells/uL (ref 0–200)
EOS ABS: 308 {cells}/uL (ref 15–500)
Eosinophils Relative: 4.1 %
HCT: 45.6 % (ref 38.5–50.0)
Hemoglobin: 16.2 g/dL (ref 13.2–17.1)
Lymphs Abs: 3953 cells/uL — ABNORMAL HIGH (ref 850–3900)
MCH: 31.4 pg (ref 27.0–33.0)
MCHC: 35.5 g/dL (ref 32.0–36.0)
MCV: 88.4 fL (ref 80.0–100.0)
MONOS PCT: 9.1 %
MPV: 9.2 fL (ref 7.5–12.5)
NEUTROS PCT: 33 %
Neutro Abs: 2475 cells/uL (ref 1500–7800)
PLATELETS: 207 10*3/uL (ref 140–400)
RBC: 5.16 10*6/uL (ref 4.20–5.80)
RDW: 12.3 % (ref 11.0–15.0)
TOTAL LYMPHOCYTE: 52.7 %
WBC: 7.5 10*3/uL (ref 3.8–10.8)
WBCMIX: 683 {cells}/uL (ref 200–950)

## 2017-07-31 LAB — MAGNESIUM: Magnesium: 2.1 mg/dL (ref 1.5–2.5)

## 2017-07-31 LAB — INSULIN, RANDOM: INSULIN: 6 u[IU]/mL (ref 2.0–19.6)

## 2017-07-31 LAB — HEMOGLOBIN A1C
EAG (MMOL/L): 5.2 (calc)
Hgb A1c MFr Bld: 4.9 % of total Hgb (ref <5.7)
MEAN PLASMA GLUCOSE: 94 (calc)

## 2017-07-31 LAB — IRON, TOTAL/TOTAL IRON BINDING CAP
%SAT: 57 % (ref 15–60)
IRON: 146 ug/dL (ref 50–180)
TIBC: 257 ug/dL (ref 250–425)

## 2017-07-31 LAB — TESTOSTERONE: Testosterone: 661 ng/dL (ref 250–827)

## 2017-07-31 LAB — TSH: TSH: 4.87 m[IU]/L — AB (ref 0.40–4.50)

## 2017-07-31 LAB — LIPID PANEL
CHOL/HDL RATIO: 3.2 (calc) (ref <5.0)
CHOLESTEROL: 192 mg/dL (ref <200)
HDL: 60 mg/dL (ref >40)
LDL CHOLESTEROL (CALC): 89 mg/dL
Non-HDL Cholesterol (Calc): 132 mg/dL (calc) — ABNORMAL HIGH (ref <130)
Triglycerides: 326 mg/dL — ABNORMAL HIGH (ref <150)

## 2017-07-31 LAB — MICROALBUMIN / CREATININE URINE RATIO
Creatinine, Urine: 63 mg/dL (ref 20–320)
MICROALB UR: 1 mg/dL
Microalb Creat Ratio: 16 mcg/mg creat (ref <30)

## 2017-07-31 LAB — VITAMIN B12: Vitamin B-12: 252 pg/mL (ref 200–1100)

## 2017-07-31 LAB — VITAMIN D 25 HYDROXY (VIT D DEFICIENCY, FRACTURES): VIT D 25 HYDROXY: 76 ng/mL (ref 30–100)

## 2017-08-28 ENCOUNTER — Other Ambulatory Visit: Payer: Self-pay | Admitting: Internal Medicine

## 2017-08-28 DIAGNOSIS — G4700 Insomnia, unspecified: Secondary | ICD-10-CM

## 2017-10-20 ENCOUNTER — Other Ambulatory Visit: Payer: Self-pay

## 2017-10-20 MED ORDER — AMITRIPTYLINE HCL 50 MG PO TABS: ORAL_TABLET | ORAL | 1 refills | Status: DC

## 2017-10-28 ENCOUNTER — Other Ambulatory Visit: Payer: Self-pay | Admitting: Internal Medicine

## 2017-10-28 DIAGNOSIS — G4700 Insomnia, unspecified: Secondary | ICD-10-CM

## 2017-10-29 ENCOUNTER — Other Ambulatory Visit: Payer: Self-pay | Admitting: Internal Medicine

## 2017-10-29 MED ORDER — ALPRAZOLAM 1 MG PO TABS: ORAL_TABLET | ORAL | 0 refills | Status: DC

## 2017-12-02 ENCOUNTER — Other Ambulatory Visit: Payer: Self-pay | Admitting: Internal Medicine

## 2018-01-01 ENCOUNTER — Other Ambulatory Visit: Payer: Self-pay | Admitting: Physician Assistant

## 2018-01-07 ENCOUNTER — Other Ambulatory Visit: Payer: Self-pay | Admitting: Physician Assistant

## 2018-01-24 ENCOUNTER — Other Ambulatory Visit: Payer: Self-pay | Admitting: Internal Medicine

## 2018-01-29 ENCOUNTER — Encounter (INDEPENDENT_AMBULATORY_CARE_PROVIDER_SITE_OTHER): Payer: Self-pay

## 2018-01-29 ENCOUNTER — Other Ambulatory Visit: Payer: Self-pay | Admitting: Internal Medicine

## 2018-03-03 ENCOUNTER — Other Ambulatory Visit: Payer: Self-pay | Admitting: Physician Assistant

## 2018-03-31 ENCOUNTER — Other Ambulatory Visit: Payer: Self-pay | Admitting: Internal Medicine

## 2018-04-01 DIAGNOSIS — G4700 Insomnia, unspecified: Secondary | ICD-10-CM | POA: Insufficient documentation

## 2018-04-01 NOTE — Progress Notes (Signed)
FOLLOW UP  Assessment and Plan:   Hypertension Well controlled with current medications  Monitor blood pressure at home; patient to call if consistently greater than 130/80 - he will have paramedic dad check regularly for a while Continue DASH diet.   Reminder to go to the ER if any CP, SOB, nausea, dizziness, severe HA, changes vision/speech, left arm numbness and tingling and jaw pain.  Cholesterol Currently at LDL goal; continue statin; diet for triglycerides discussed, on omega 3 - add fenofibrate for severe elevations Continue low cholesterol diet and exercise.  Check lipid panel.   Other abnormal glucose Recent A1Cs at goal Discussed diet/exercise, weight management  Defer A1C; check BMP  BMI 20 Continue to recommend diet heavy in fruits and veggies and low in animal meats, cheeses, and dairy products, appropriate calorie intake Discuss exercise recommendations routinely Continue to monitor weight at each visit  Vitamin D Def At goal at last visit; continue supplementation to maintain goal of 70-100 Defer Vit D level  Tobacco use Discussed risks associated with tobacco use and advised to reduce or quit Patient is not ready to do so, but advised to consider strongly Will follow up at the next visit  Insomnia Currently taking xanax to sleep; increase gabapentin to 300 mg nightly, then up to 600 mg if needed - message back with progress in 2-3 weeks. If doing well, will try to taper down further on xanax. - good sleep hygiene discussed, increase day time activity   Continue diet and meds as discussed. Further disposition pending results of labs. Discussed med's effects and SE's.   Over 30 minutes of exam, counseling, chart review, and critical decision making was performed.   Future Appointments  Date Time Provider Department Center  08/25/2018  9:00 AM Lucky Cowboy, MD [facility] None     ----------------------------------------------------------------------------------------------------------------------  HPI 37 y.o. male  presents for 6 month follow up on hypertension, cholesterol, glucose, weight and vitamin D deficiency.   He also has insomnia and is prescribed xanax 0.5-1 mg to be taken once daily; we have reached out to the patient to transition to alternate agent if possible, currently taking 1 mg of xanax, taking 100 mg gabapentin at night to try to taper down on xanax, down from 2 mg. He reports 100 mg of gabapentin has not been helpful, has been unable to taper down further with xanax.   he currently continues to smoke 1 pack a day; discussed risks associated with smoking, patient is not ready to quit.   BMI is Body mass index is 20.95 kg/m., he has been working on diet and exercise. Stress levels improved with new job. Wt Readings from Last 3 Encounters:  04/06/18 146 lb (66.2 kg)  07/30/17 135 lb 12.8 oz (61.6 kg)  05/02/17 138 lb 3.2 oz (62.7 kg)   He does not check BP at home, today their BP is BP: 128/86  He does workout. He denies chest pain, shortness of breath, dizziness.   He is on cholesterol medication (pravastatin 20 mg daily) and denies myalgias. His LDL cholesterol is at goal; triglycerides remain elevated. The cholesterol last visit was:   Lab Results  Component Value Date   CHOL 192 07/30/2017   HDL 60 07/30/2017   LDLCALC 89 07/30/2017   TRIG 326 (H) 07/30/2017   CHOLHDL 3.2 07/30/2017    He has been working on diet and exercise for glucose management, and denies foot ulcerations, increased appetite, nausea, paresthesia of the feet, polydipsia, polyuria, visual disturbances,  vomiting and weight loss. Last A1C in the office was:  Lab Results  Component Value Date   HGBA1C 4.9 07/30/2017   Patient is on Vitamin D supplement.   Lab Results  Component Value Date   VD25OH 76 07/30/2017        Current Medications:  Current  Outpatient Medications on File Prior to Visit  Medication Sig  . ALPRAZolam (XANAX) 1 MG tablet TAKE 1/2 TO 1 TABLET BEFORE SLEEP & PLEASE TRY TO LIMIT TO 5 DAYS /WEEK TO AVOID ADDICTION  . amitriptyline (ELAVIL) 50 MG tablet TAKE 1 TABLET BY MOUTH 3 TIMES A DAY FOR CHRONIC NECK PAIN  . Cholecalciferol (VITAMIN D-3) 5000 UNITS TABS Take 1 tablet by mouth daily.   Marland Kitchen gabapentin (NEURONTIN) 100 MG capsule TAKE 1 CAPSULE BY MOUTH 3 TIMES A DAY AS DIRECTED  . Magnesium 250 MG TABS Take 1 tablet by mouth daily.   . nadolol (CORGARD) 80 MG tablet TAKE 1/2 TO 1 TABLET BY MOUTH EVERY DAY FOR BLOOD PRESSURE AND HEART PALPITATIONS  . Omega-3 Fatty Acids (FISH OIL PO) Take 300 mg by mouth daily.  . pravastatin (PRAVACHOL) 40 MG tablet TAKE 1 TABLET BY MOUTH AT BEDTIME  . ranitidine (ZANTAC) 150 MG tablet Takes 1 to 2 tabs/ day for indigestion   No current facility-administered medications on file prior to visit.      Allergies:  Allergies  Allergen Reactions  . Sulfa Antibiotics   . Trazodone And Nefazodone     Severe dreams     Medical History:  Past Medical History:  Diagnosis Date  . Anxiety   . HLD (hyperlipidemia)   . Insomnia   . Lumbago   . Tension headache   . Unspecified vitamin D deficiency    Family history- Reviewed and unchanged Social history- Reviewed and unchanged   Review of Systems:  Review of Systems  Constitutional: Negative for malaise/fatigue and weight loss.  HENT: Negative for hearing loss and tinnitus.   Eyes: Negative for blurred vision and double vision.  Respiratory: Negative for cough, shortness of breath and wheezing.   Cardiovascular: Negative for chest pain, palpitations, orthopnea, claudication and leg swelling.  Gastrointestinal: Negative for abdominal pain, blood in stool, constipation, diarrhea, heartburn, melena, nausea and vomiting.  Genitourinary: Negative.   Musculoskeletal: Negative for joint pain and myalgias.  Skin: Negative for rash.   Neurological: Negative for dizziness, tingling, sensory change, weakness and headaches.  Endo/Heme/Allergies: Negative for polydipsia.  Psychiatric/Behavioral: Positive for substance abuse (Drinks 6 pack every night, down from previous). The patient has insomnia. The patient is not nervous/anxious.   All other systems reviewed and are negative.     Physical Exam: BP 128/86   Pulse 88   Temp (!) 97.5 F (36.4 C)   Ht 5\' 10"  (1.778 m)   Wt 146 lb (66.2 kg)   SpO2 99%   BMI 20.95 kg/m  Wt Readings from Last 3 Encounters:  04/06/18 146 lb (66.2 kg)  07/30/17 135 lb 12.8 oz (61.6 kg)  05/02/17 138 lb 3.2 oz (62.7 kg)   General Appearance: Well nourished, in no apparent distress. Eyes: PERRLA, EOMs, conjunctiva no swelling or erythema Sinuses: No Frontal/maxillary tenderness ENT/Mouth: Ext aud canals clear, TMs without erythema, bulging. No erythema, swelling, or exudate on post pharynx.  Tonsils not swollen or erythematous. Hearing normal.  Neck: Supple, thyroid normal.  Respiratory: Respiratory effort normal, BS equal bilaterally without rales, rhonchi, wheezing or stridor.  Cardio: RRR with no MRGs. Brisk peripheral  pulses without edema.  Abdomen: Soft, + BS.  Non tender, no guarding, rebound, hernias, masses. Lymphatics: Non tender without lymphadenopathy.  Musculoskeletal: Full ROM, 5/5 strength, Normal gait Skin: Warm, dry without rashes, lesions, ecchymosis.  Neuro: Cranial nerves intact. No cerebellar symptoms.  Psych: Awake and oriented X 3, normal affect, Insight and Judgment appropriate.    Dan MakerAshley C Feven Alderfer, NP 9:55 AM [facility]

## 2018-04-06 ENCOUNTER — Ambulatory Visit: Payer: Self-pay | Admitting: Adult Health

## 2018-04-06 ENCOUNTER — Encounter: Payer: Self-pay | Admitting: Adult Health

## 2018-04-06 ENCOUNTER — Ambulatory Visit (INDEPENDENT_AMBULATORY_CARE_PROVIDER_SITE_OTHER): Payer: Self-pay | Admitting: Adult Health

## 2018-04-06 VITALS — BP 128/86 | HR 88 | Temp 97.5°F | Ht 70.0 in | Wt 146.0 lb

## 2018-04-06 DIAGNOSIS — G4700 Insomnia, unspecified: Secondary | ICD-10-CM

## 2018-04-06 DIAGNOSIS — Z79899 Other long term (current) drug therapy: Secondary | ICD-10-CM

## 2018-04-06 DIAGNOSIS — E782 Mixed hyperlipidemia: Secondary | ICD-10-CM

## 2018-04-06 DIAGNOSIS — I10 Essential (primary) hypertension: Secondary | ICD-10-CM

## 2018-04-06 DIAGNOSIS — E559 Vitamin D deficiency, unspecified: Secondary | ICD-10-CM

## 2018-04-06 DIAGNOSIS — F17200 Nicotine dependence, unspecified, uncomplicated: Secondary | ICD-10-CM

## 2018-04-06 LAB — COMPLETE METABOLIC PANEL WITH GFR
AG RATIO: 1.6 (calc) (ref 1.0–2.5)
ALBUMIN MSPROF: 4.5 g/dL (ref 3.6–5.1)
ALT: 24 U/L (ref 9–46)
AST: 24 U/L (ref 10–40)
Alkaline phosphatase (APISO): 56 U/L (ref 40–115)
BUN: 11 mg/dL (ref 7–25)
CO2: 26 mmol/L (ref 20–32)
Calcium: 10.6 mg/dL — ABNORMAL HIGH (ref 8.6–10.3)
Chloride: 104 mmol/L (ref 98–110)
Creat: 0.95 mg/dL (ref 0.60–1.35)
GFR, EST AFRICAN AMERICAN: 118 mL/min/{1.73_m2} (ref >=60)
GFR, EST NON AFRICAN AMERICAN: 102 mL/min/{1.73_m2} (ref >=60)
Globulin: 2.9 g/dL (calc) (ref 1.9–3.7)
Glucose, Bld: 95 mg/dL (ref 65–99)
POTASSIUM: 4.4 mmol/L (ref 3.5–5.3)
Sodium: 138 mmol/L (ref 135–146)
TOTAL PROTEIN: 7.4 g/dL (ref 6.1–8.1)
Total Bilirubin: 0.6 mg/dL (ref 0.2–1.2)

## 2018-04-06 LAB — CBC WITH DIFFERENTIAL/PLATELET
BASOS PCT: 1.1 %
Basophils Absolute: 70 cells/uL (ref 0–200)
EOS ABS: 339 {cells}/uL (ref 15–500)
Eosinophils Relative: 5.3 %
HEMATOCRIT: 46.9 % (ref 38.5–50.0)
HEMOGLOBIN: 16.3 g/dL (ref 13.2–17.1)
LYMPHS ABS: 2342 {cells}/uL (ref 850–3900)
MCH: 31 pg (ref 27.0–33.0)
MCHC: 34.8 g/dL (ref 32.0–36.0)
MCV: 89.3 fL (ref 80.0–100.0)
MPV: 9.5 fL (ref 7.5–12.5)
Monocytes Relative: 10.2 %
NEUTROS ABS: 2995 {cells}/uL (ref 1500–7800)
Neutrophils Relative %: 46.8 %
PLATELETS: 197 10*3/uL (ref 140–400)
RBC: 5.25 10*6/uL (ref 4.20–5.80)
RDW: 12.3 % (ref 11.0–15.0)
TOTAL LYMPHOCYTE: 36.6 %
WBC: 6.4 10*3/uL (ref 3.8–10.8)
WBCMIX: 653 {cells}/uL (ref 200–950)

## 2018-04-06 LAB — LIPID PANEL
Cholesterol: 197 mg/dL (ref <200)
HDL: 55 mg/dL (ref >40)
LDL CHOLESTEROL (CALC): 106 mg/dL — AB
NON-HDL CHOLESTEROL (CALC): 142 mg/dL — AB (ref <130)
TRIGLYCERIDES: 235 mg/dL — AB (ref <150)
Total CHOL/HDL Ratio: 3.6 (calc) (ref <5.0)

## 2018-04-06 LAB — TSH: TSH: 3.01 mIU/L (ref 0.40–4.50)

## 2018-04-06 NOTE — Patient Instructions (Addendum)
Please increase gabapentin to 300 mg at night (1 hour prior to bedtime)  Can further increase ~ 600 mg if helpful but needing higher dose.   Message back in a few weeks to let me know how you're doing with higher doses of gabapentin, and if you change your mind about a low dose SSRI   Can increase ranitidine/zantac up to 300 mg twice daily as needed, or just increase when having problems, or can supplement with prilosec or nexium just as needed, ok to take as long as not taking daily   Aim for 7+ servings of fruits and vegetables daily  80+ fluid ounces of water or unsweet tea for healthy kidneys  Try to limit to 1-2 drinks per day  Limit animal fats in diet for cholesterol and heart health - choose grass fed whenever available  Aim for low stress - take time to unwind and care for your mental health  Aim for 150 min of moderate intensity exercise weekly for heart health, and weights twice weekly for bone health  Aim for 7-9 hours of sleep daily     Steps to Quit Smoking Smoking tobacco can be harmful to your health and can affect almost every organ in your body. Smoking puts you, and those around you, at risk for developing many serious chronic diseases. Quitting smoking is difficult, but it is one of the best things that you can do for your health. It is never too late to quit. What are the benefits of quitting smoking? When you quit smoking, you lower your risk of developing serious diseases and conditions, such as:  Lung cancer or lung disease, such as COPD.  Heart disease.  Stroke.  Heart attack.  Infertility.  Osteoporosis and bone fractures.  Additionally, symptoms such as coughing, wheezing, and shortness of breath may get better when you quit. You may also find that you get sick less often because your body is stronger at fighting off colds and infections. If you are pregnant, quitting smoking can help to reduce your chances of having a baby of low birth  weight. How do I get ready to quit? When you decide to quit smoking, create a plan to make sure that you are successful. Before you quit:  Pick a date to quit. Set a date within the next two weeks to give you time to prepare.  Write down the reasons why you are quitting. Keep this list in places where you will see it often, such as on your bathroom mirror or in your car or wallet.  Identify the people, places, things, and activities that make you want to smoke (triggers) and avoid them. Make sure to take these actions: ? Throw away all cigarettes at home, at work, and in your car. ? Throw away smoking accessories, such as Set designer. ? Clean your car and make sure to empty the ashtray. ? Clean your home, including curtains and carpets.  Tell your family, friends, and coworkers that you are quitting. Support from your loved ones can make quitting easier.  Talk with your health care provider about your options for quitting smoking.  Find out what treatment options are covered by your health insurance.  What strategies can I use to quit smoking? Talk with your healthcare provider about different strategies to quit smoking. Some strategies include:  Quitting smoking altogether instead of gradually lessening how much you smoke over a period of time. Research shows that quitting "cold [place]" is more successful than gradually  quitting.  Attending in-person counseling to help you build problem-solving skills. You are more likely to have success in quitting if you attend several counseling sessions. Even short sessions of 10 minutes can be effective.  Finding resources and support systems that can help you to quit smoking and remain smoke-free after you quit. These resources are most helpful when you use them often. They can include: ? Online chats with a Veterinary surgeon. ? Telephone quitlines. ? Automotive engineer. ? Support groups or group counseling. ? Text messaging  programs. ? Mobile phone applications.  Taking medicines to help you quit smoking. (If you are pregnant or breastfeeding, talk with your health care provider first.) Some medicines contain nicotine and some do not. Both types of medicines help with cravings, but the medicines that include nicotine help to relieve withdrawal symptoms. Your health care provider may recommend: ? Nicotine patches, gum, or lozenges. ? Nicotine inhalers or sprays. ? Non-nicotine medicine that is taken by mouth.  Talk with your health care provider about combining strategies, such as taking medicines while you are also receiving in-person counseling. Using these two strategies together makes you more likely to succeed in quitting than if you used either strategy on its own. If you are pregnant or breastfeeding, talk with your health care provider about finding counseling or other support strategies to quit smoking. Do not take medicine to help you quit smoking unless told to do so by your health care provider. What things can I do to make it easier to quit? Quitting smoking might feel overwhelming at first, but there is a lot that you can do to make it easier. Take these important actions:  Reach out to your family and friends and ask that they support and encourage you during this time. Call telephone quitlines, reach out to support groups, or work with a counselor for support.  Ask people who smoke to avoid smoking around you.  Avoid places that trigger you to smoke, such as bars, parties, or smoke-break areas at work.  Spend time around people who do not smoke.  Lessen stress in your life, because stress can be a smoking trigger for some people. To lessen stress, try: ? Exercising regularly. ? Deep-breathing exercises. ? Yoga. ? Meditating. ? Performing a body scan. This involves closing your eyes, scanning your body from head to toe, and noticing which parts of your body are particularly tense. Purposefully  relax the muscles in those areas.  Download or purchase mobile phone or tablet apps (applications) that can help you stick to your quit plan by providing reminders, tips, and encouragement. There are many free apps, such as QuitGuide from the [employer] developer for Disease Control and Prevention). You can find other support for quitting smoking (smoking cessation) through smokefree.gov and other websites.  How will I feel when I quit smoking? Within the first 24 hours of quitting smoking, you may start to feel some withdrawal symptoms. These symptoms are usually most noticeable 2-3 days after quitting, but they usually do not last beyond 2-3 weeks. Changes or symptoms that you might experience include:  Mood swings.  Restlessness, anxiety, or irritation.  Difficulty concentrating.  Dizziness.  Strong cravings for sugary foods in addition to nicotine.  Mild weight gain.  Constipation.  Nausea.  Coughing or a sore throat.  Changes in how your medicines work in your body.  A depressed mood.  Difficulty sleeping (insomnia).  After the first 2-3 weeks of quitting, you may start to notice more positive  results, such as:  Improved sense of smell and taste.  Decreased coughing and sore throat.  Slower heart rate.  Lower blood pressure.  Clearer skin.  The ability to breathe more easily.  Fewer sick days.  Quitting smoking is very challenging for most people. Do not get discouraged if you are not successful the first time. Some people need to make many attempts to quit before they achieve long-term success. Do your best to stick to your quit plan, and talk with your health care provider if you have any questions or concerns. This information is not intended to replace advice given to you by your health care provider. Make sure you discuss any questions you have with your health care provider. Document Released: 09/10/2001 Document Revised: 05/14/2016 Document Reviewed:  01/31/2015 Elsevier Interactive Patient Education  2018 [employer]    Citalopram tablets What is this medicine? CITALOPRAM (sye TAL oh pram) is a medicine for depression. This medicine may be used for other purposes; ask your health care provider or pharmacist if you have questions. COMMON BRAND NAME(S): Celexa What should I tell my health care provider before I take this medicine? They need to know if you have any of these conditions: -bleeding disorders -bipolar disorder or a family history of bipolar disorder -glaucoma -heart disease -history of irregular heartbeat -kidney disease -liver disease -low levels of magnesium or potassium in the blood -receiving electroconvulsive therapy -seizures -suicidal thoughts, plans, or attempt; a previous suicide attempt by you or a family member -take medicines that treat or prevent blood clots -thyroid disease -an unusual or allergic reaction to citalopram, escitalopram, other medicines, foods, dyes, or preservatives -pregnant or trying to become pregnant -breast-feeding How should I use this medicine? Take this medicine by mouth with a glass of water. Follow the directions on the prescription label. You can take it with or without food. Take your medicine at regular intervals. Do not take your medicine more often than directed. Do not stop taking this medicine suddenly except upon the advice of your doctor. Stopping this medicine too quickly may cause serious side effects or your condition may worsen. A special MedGuide will be given to you by the pharmacist with each prescription and refill. Be sure to read this information carefully each time. Talk to your pediatrician regarding the use of this medicine in children. Special care may be needed. Patients over 37 years old may have a stronger reaction and need a smaller dose. Overdosage: If you think you have taken too much of this medicine contact a poison control center or emergency room  at once. NOTE: This medicine is only for you. Do not share this medicine with others. What if I miss a dose? If you miss a dose, take it as soon as you can. If it is almost time for your next dose, take only that dose. Do not take double or extra doses. What may interact with this medicine? Do not take this medicine with any of the following medications: -certain medicines for fungal infections like fluconazole, itraconazole, ketoconazole, posaconazole, voriconazole -cisapride -dofetilide -dronedarone -escitalopram -linezolid -MAOIs like Carbex, Eldepryl, Marplan, Nardil, and Parnate -methylene blue (injected into a vein) -pimozide -thioridazine -ziprasidone This medicine may also interact with the following medications: -alcohol -amphetamines -aspirin and aspirin-like medicines -carbamazepine -certain medicines for depression, anxiety, or psychotic disturbances -certain medicines for infections like chloroquine, clarithromycin, erythromycin, furazolidone, isoniazid, pentamidine -certain medicines for migraine headaches like almotriptan, eletriptan, frovatriptan, naratriptan, rizatriptan, sumatriptan, zolmitriptan -certain medicines for sleep -certain  medicines that treat or prevent blood clots like dalteparin, enoxaparin, warfarin -cimetidine -diuretics -fentanyl -lithium -methadone -metoprolol -NSAIDs, medicines for pain and inflammation, like ibuprofen or naproxen -omeprazole -other medicines that prolong the QT interval (cause an abnormal heart rhythm) -procarbazine -rasagiline -supplements like St. John's wort, kava kava, valerian -tramadol -tryptophan This list may not describe all possible interactions. Give your health care provider a list of all the medicines, herbs, non-prescription drugs, or dietary supplements you use. Also tell them if you smoke, drink alcohol, or use illegal drugs. Some items may interact with your medicine. What should I watch for while using  this medicine? Tell your doctor if your symptoms do not get better or if they get worse. Visit your doctor or health care professional for regular checks on your progress. Because it may take several weeks to see the full effects of this medicine, it is important to continue your treatment as prescribed by your doctor. Patients and their families should watch out for new or worsening thoughts of suicide or depression. Also watch out for sudden changes in feelings such as feeling anxious, agitated, panicky, irritable, hostile, aggressive, impulsive, severely restless, overly excited and hyperactive, or not being able to sleep. If this happens, especially at the beginning of treatment or after a change in dose, call your health care professional. Bonita Quin may get drowsy or dizzy. Do not drive, use machinery, or do anything that needs mental alertness until you know how this medicine affects you. Do not stand or sit up quickly, especially if you are an older patient. This reduces the risk of dizzy or fainting spells. Alcohol may interfere with the effect of this medicine. Avoid alcoholic drinks. Your mouth may get dry. Chewing sugarless gum or sucking hard candy, and drinking plenty of water will help. Contact your doctor if the problem does not go away or is severe. What side effects may I notice from receiving this medicine? Side effects that you should report to your doctor or health care professional as soon as possible: -allergic reactions like skin rash, itching or hives, swelling of the face, lips, or tongue -anxious -black, tarry stools -breathing problems -changes in vision -chest pain -confusion -elevated mood, decreased need for sleep, racing thoughts, impulsive behavior -eye pain -fast, irregular heartbeat -feeling faint or lightheaded, falls -feeling agitated, angry, or irritable -hallucination, loss of contact with reality -loss of balance or coordination -loss of memory -painful or  prolonged erections -restlessness, pacing, inability to keep still -seizures -stiff muscles -suicidal thoughts or other mood changes -trouble sleeping -unusual bleeding or bruising -unusually weak or tired -vomiting Side effects that usually do not require medical attention (report to your doctor or health care professional if they continue or are bothersome): -change in appetite or weight -change in sex drive or performance -dizziness -headache -increased sweating -indigestion, nausea -tremors This list may not describe all possible side effects. Call your doctor for medical advice about side effects. You may report side effects to FDA at 1-800-FDA-1088. Where should I keep my medicine? Keep out of reach of children. Store at room temperature between 15 and 30 degrees C (59 and 86 degrees F). Throw away any unused medicine after the expiration date. NOTE: This sheet is a summary. It may not cover all possible information. If you have questions about this medicine, talk to your doctor, pharmacist, or health care provider.  2018 Elsevier/Gold Standard (2016-02-19 13:18:52)

## 2018-04-29 ENCOUNTER — Other Ambulatory Visit: Payer: Self-pay | Admitting: Adult Health

## 2018-05-11 ENCOUNTER — Other Ambulatory Visit: Payer: Self-pay | Admitting: Adult Health

## 2018-05-11 MED ORDER — GABAPENTIN 300 MG PO CAPS: ORAL_CAPSULE | ORAL | 2 refills | Status: DC

## 2018-05-11 MED ORDER — AMITRIPTYLINE HCL 50 MG PO TABS: ORAL_TABLET | ORAL | 1 refills | Status: DC

## 2018-06-02 ENCOUNTER — Other Ambulatory Visit: Payer: Self-pay | Admitting: Adult Health

## 2018-07-02 ENCOUNTER — Other Ambulatory Visit: Payer: Self-pay | Admitting: Adult Health

## 2018-08-03 ENCOUNTER — Other Ambulatory Visit: Payer: Self-pay | Admitting: Physician Assistant

## 2018-08-24 ENCOUNTER — Encounter: Payer: Self-pay | Admitting: Internal Medicine

## 2018-08-24 NOTE — Patient Instructions (Addendum)
Food Choices for Gastroesophageal Reflux Disease  When you have gastroesophageal reflux disease (GERD), the foods you eat and your eating habits are very important. Choosing the right foods can help ease your discomfort. What guidelines do I need to follow?  Choose fruits, vegetables, whole grains, and low-fat dairy products.  Choose low-fat meat, fish, and poultry.  Limit fats such as oils, salad dressings, butter, nuts, and avocado.  Keep a food diary. This helps you identify foods that cause symptoms.  Avoid foods that cause symptoms. These may be different for everyone.  Eat small meals often instead of 3 large meals a day.  Eat your meals slowly, in a place where you are relaxed.  Limit fried foods.  Cook foods using methods other than frying.  Avoid drinking alcohol.  Avoid drinking large amounts of liquids with your meals.  Avoid bending over or lying down until 2-3 hours after eating. What foods are not recommended? These are some foods and drinks that may make your symptoms worse: Vegetables Tomatoes. Tomato juice. Tomato and spaghetti sauce. Chili peppers. Onion and garlic. Horseradish. Fruits Absolutely no Bananas Oranges grapefruit, and lemon (fruit and juice). Meats High-fat meats, fish, and poultry. This includes hot dogs, ribs, ham, sausage, salami, and bacon. Dairy Whole milk and chocolate milk. Sour cream. Cream. Butter. Ice cream. Cream cheese. Drinks Coffee and tea. Bubbly (carbonated) drinks or energy drinks. Condiments Hot sauce. Barbecue sauce. Sweets/Desserts Chocolate and cocoa. Donuts. Peppermint and spearmint. Fats and Oils High-fat foods. This includes [place] fries and potato chips. Other Vinegar. Strong spices. This includes black pepper, white pepper, red pepper, cayenne, curry powder, cloves, ginger, and chili powder. The items listed above may not be a complete list of foods and drinks to avoid. Contact your dietitian for more  information. This information is not intended to replace advice given to you by your health care provider. Make sure you discuss any questions you have with your health care provider. Document Released: 03/17/2012 Document Revised: 02/22/2016 Document Reviewed: 07/21/2013 Elsevier Interactive Patient Education  2017 [employer]

## 2018-08-24 NOTE — Progress Notes (Signed)
[facility]   Lucky CowboyWilliam Caraline Deutschman, M.D.     Dyanne CarrelAmanda R. Steffanie Dunnollier, P.A.-C Judd GaudierAshley Corbett, DNP [facility]                [address]                 [place], [place] [id number] Telephone [phone] Telefax [phone]      This very nice 37 y.o. single WM  presents for follow-up. Patient's GERD is controlled on his meds.      Patient is followed for  HTN predating since 2012. Patient's BP has been controlled at home.  Today's BP is at goal -  122/84. Patient denies any cardiac symptoms as chest pain, palpitations, shortness of breath, dizziness or ankle swelling.     Patient's hyperlipidemia is controlled with diet and currently he's off of meds. Lab Results  Component Value Date   CHOL 197 04/06/2018   HDL 55 04/06/2018   LDLCALC 106 (H) 04/06/2018   TRIG 235 (H) 04/06/2018   CHOLHDL 3.6 04/06/2018      Patient is proactively monitored for prediabetes  and patient denies reactive hypoglycemic symptoms, visual blurring, diabetic polys or paresthesias. Last A1c was Normal & at goal:  Lab Results  Component Value Date   HGBA1C 4.9 07/30/2017       Finally, patient has history of Vitamin D Deficiency ("28" / 2012) and last vitamin D was at goal: Lab Results  Component Value Date   VD25OH 76 07/30/2017   Current Outpatient Medications on File Prior to Visit  Medication Sig  . ALPRAZolam (XANAX) 1 MG tablet TAKE 1/2 TO 1 TABLET BEFORE SLEEP & PLEASE TRY TO LIMIT TO 5 DAYS /WEEK TO AVOID ADDICTION  . amitriptyline (ELAVIL) 50 MG tablet TAKE 1 TABLET BY MOUTH 3 TIMES A DAY FOR CHRONIC NECK PAIN  . Cholecalciferol (VITAMIN D-3) 5000 UNITS TABS Take 1 tablet by mouth daily.   . Magnesium 250 MG TABS Take 1 tablet by mouth daily.   . nadolol (CORGARD) 80 MG tablet TAKE 1/2 TO 1 TABLET BY MOUTH EVERY DAY FOR BLOOD PRESSURE AND HEART PALPITATIONS  . Omega-3 Fatty Acids (FISH OIL PO) Take 300 mg by mouth daily.  Marland Kitchen. OVER THE  COUNTER MEDICATION OTC Pepcid 20 mg twice a day.  . gabapentin (NEURONTIN) 300 MG capsule TAKE 1 CAPSULE BY MOUTH 3 TIMES A DAY AS DIRECTED (Patient not taking: Reported on 08/25/2018)  . pravastatin (PRAVACHOL) 40 MG tablet TAKE 1 TABLET BY MOUTH AT BEDTIME (Patient not taking: Reported on 08/25/2018)   No current facility-administered medications on file prior to visit.    Allergies  Allergen Reactions  . Sulfa Antibiotics   . Trazodone And Nefazodone     Severe dreams   Past Medical History:  Diagnosis Date  . Anxiety   . HLD (hyperlipidemia)   . Insomnia   . Lumbago   . Tension headache   . Unspecified vitamin D deficiency    Health Maintenance  Topic Date Due  . INFLUENZA VACCINE  04/30/2018  . TETANUS/TDAP  10/22/2023  . HIV Screening  Completed   Immunization History  Administered Date(s) Administered  . Influenza Inj Mdck Quad With Preservative 07/30/2017, 08/25/2018  . Influenza, Seasonal, Injecte, Preservative Fre 09/07/2015  . PPD Test 06/01/2014, 06/06/2015, 06/28/2016, 07/30/2017  . Tdap 10/21/2013   History reviewed. No pertinent surgical history.   Family History  Problem Relation Age of Onset  .  Hypertension Mother   . Diabetes Mother   . Heart disease Father   . Diabetes Father   . Colon cancer Maternal Grandfather   . Lung cancer Paternal Grandfather   . Breast cancer Maternal Grandmother   . Ovarian cancer Paternal Grandmother   . Hyperlipidemia Father    Social History   Socioeconomic History  . Marital status: Single  Occupational History  . Not on file  Tobacco Use  . Smoking status: Current Every Day Smoker    Packs/day: 1.00    Types: Cigarettes  . Smokeless tobacco: Never Used  Substance and Sexual Activity  . Alcohol use: Yes    Alcohol/week: 0.0 standard drinks    Comment: 14 beers per week  . Drug use: No  . Sexual activity: Not on file    ROS Constitutional: Denies fever, chills, weight loss/gain, headaches, insomnia,   night sweats or change in appetite. Does c/o fatigue. Eyes: Denies redness, blurred vision, diplopia, discharge, itchy or watery eyes.  ENT: Denies discharge, congestion, post nasal drip, epistaxis, sore throat, earache, hearing loss, dental pain, Tinnitus, Vertigo, Sinus pain or snoring.  Cardio: Denies chest pain, palpitations, irregular heartbeat, syncope, dyspnea, diaphoresis, orthopnea, PND, claudication or edema Respiratory: denies cough, dyspnea, DOE, pleurisy, hoarseness, laryngitis or wheezing.  Gastrointestinal: Denies dysphagia, heartburn, reflux, water brash, pain, cramps, nausea, vomiting, bloating, diarrhea, constipation, hematemesis, melena, hematochezia, jaundice or hemorrhoids Genitourinary: Denies dysuria, frequency, urgency, nocturia, hesitancy, discharge, hematuria or flank pain Musculoskeletal: Denies arthralgia, myalgia, stiffness, Jt. Swelling, pain, limp or strain/sprain. Denies Falls. Skin: Denies puritis, rash, hives, warts, acne, eczema or change in skin lesion Neuro: No weakness, tremor, incoordination, spasms, paresthesia or pain Psychiatric: Denies confusion, memory loss or sensory loss. Denies Depression. Endocrine: Denies change in weight, skin, hair change, nocturia, and paresthesia, diabetic polys, visual blurring or hyper / hypo glycemic episodes.  Heme/Lymph: No excessive bleeding, bruising or enlarged lymph nodes.  Physical Exam  BP 122/84   Pulse 80   Temp (!) 97.3 F (36.3 C)   Resp 16   Ht 5' 10.5" (1.791 m)   Wt 157 lb 3.2 oz (71.3 kg)   BMI 22.24 kg/m   General Appearance: Well nourished and well groomed and in no apparent distress.  Eyes: PERRLA, EOMs, conjunctiva no swelling or erythema, normal fundi and vessels. Sinuses: No frontal/maxillary tenderness ENT/Mouth: EACs patent / TMs  nl. Nares clear without erythema, swelling, mucoid exudates. Oral hygiene is good. No erythema, swelling, or exudate. Tongue normal, non-obstructing. Tonsils not  swollen or erythematous. Hearing normal.  Neck: Supple, thyroid not palpable. No bruits, nodes or JVD. Respiratory: Respiratory effort normal.  BS equal and clear bilateral without rales, rhonci, wheezing or stridor. Cardio: Heart sounds are normal with regular rate and rhythm and no murmurs, rubs or gallops. Peripheral pulses are normal and equal bilaterally without edema. No aortic or femoral bruits. Chest: symmetric with normal excursions and percussion.  Abdomen: Soft, with Nl bowel sounds. Nontender, no guarding, rebound, hernias, masses, or organomegaly.  Lymphatics: Non tender without lymphadenopathy.  Genitourinary: No hernias.Testes nl. DRE - prostate nl for age - smooth & firm w/o nodules. Musculoskeletal: Full ROM all peripheral extremities, joint stability, 5/5 strength, and normal gait. Skin: Warm and dry without rashes, lesions, cyanosis, clubbing or  ecchymosis.  Neuro: Cranial nerves intact, reflexes equal bilaterally. Normal muscle tone, no cerebellar symptoms. Sensation intact.  Pysch: Alert and oriented X 3 with normal affect, insight and judgment appropriate.   Assessment and Plan  1.  Labile hypertension  2. Gastroesophageal reflux disease without esophagitis  - omeprazole (PRILOSEC) 40 MG capsule; Take 1 capsule daily   for Acid Reflux  Dispense: 90 capsule; Refill: 3  3. Need for prophylactic vaccination and inoculation against influenza  - FLU VACCINE MDCK QUAD W/Preservative       Patient recently changed jobs and his new insurance has not yet taken effect so he defers any labs until he has insurance coverage. Patient was counseled in prudent diet, weight control to achieve/maintain BMI less than 25, BP monitoring, regular exercise and medications as discussed.  Discussed med effects and SE's. Over 20 minutes of exam, counseling, chart review and high complex critical decision making was performed

## 2018-08-25 ENCOUNTER — Ambulatory Visit (INDEPENDENT_AMBULATORY_CARE_PROVIDER_SITE_OTHER): Payer: Self-pay | Admitting: Internal Medicine

## 2018-08-25 VITALS — BP 122/84 | HR 80 | Temp 97.3°F | Resp 16 | Ht 70.5 in | Wt 157.2 lb

## 2018-08-25 DIAGNOSIS — K219 Gastro-esophageal reflux disease without esophagitis: Secondary | ICD-10-CM | POA: Insufficient documentation

## 2018-08-25 DIAGNOSIS — Z23 Encounter for immunization: Secondary | ICD-10-CM

## 2018-08-25 DIAGNOSIS — R0989 Other specified symptoms and signs involving the circulatory and respiratory systems: Secondary | ICD-10-CM

## 2018-08-25 MED ORDER — OMEPRAZOLE 40 MG PO CPDR: DELAYED_RELEASE_CAPSULE | ORAL | 3 refills | Status: DC

## 2018-09-02 ENCOUNTER — Other Ambulatory Visit: Payer: Self-pay | Admitting: Internal Medicine

## 2018-10-02 ENCOUNTER — Other Ambulatory Visit: Payer: Self-pay | Admitting: Internal Medicine

## 2018-10-04 ENCOUNTER — Other Ambulatory Visit: Payer: Self-pay | Admitting: Adult Health

## 2018-11-02 ENCOUNTER — Other Ambulatory Visit: Payer: Self-pay | Admitting: Adult Health

## 2018-11-02 ENCOUNTER — Other Ambulatory Visit: Payer: Self-pay | Admitting: Internal Medicine

## 2018-11-11 ENCOUNTER — Other Ambulatory Visit: Payer: Self-pay | Admitting: Adult Health

## 2018-12-02 ENCOUNTER — Other Ambulatory Visit: Payer: Self-pay | Admitting: Physician Assistant

## 2018-12-10 ENCOUNTER — Ambulatory Visit: Payer: Self-pay | Admitting: Physician Assistant

## 2018-12-10 ENCOUNTER — Ambulatory Visit: Payer: Self-pay | Admitting: Internal Medicine

## 2018-12-30 ENCOUNTER — Other Ambulatory Visit: Payer: Self-pay | Admitting: Internal Medicine

## 2019-01-01 ENCOUNTER — Ambulatory Visit: Payer: Self-pay | Admitting: Adult Health Nurse Practitioner

## 2019-01-29 ENCOUNTER — Other Ambulatory Visit: Payer: Self-pay | Admitting: Physician Assistant

## 2019-02-02 ENCOUNTER — Other Ambulatory Visit: Payer: Self-pay | Admitting: *Deleted

## 2019-02-02 MED ORDER — PRAVASTATIN SODIUM 40 MG PO TABS: 40.0000 mg | ORAL_TABLET | Freq: Every day | ORAL | 1 refills | Status: DC

## 2019-02-05 ENCOUNTER — Ambulatory Visit: Payer: BLUE CROSS/BLUE SHIELD | Admitting: Adult Health Nurse Practitioner

## 2019-02-05 ENCOUNTER — Other Ambulatory Visit: Payer: Self-pay

## 2019-02-05 ENCOUNTER — Encounter: Payer: Self-pay | Admitting: Adult Health Nurse Practitioner

## 2019-02-05 VITALS — HR 84 | Temp 98.0°F | Ht 70.5 in | Wt 166.0 lb

## 2019-02-05 DIAGNOSIS — F17200 Nicotine dependence, unspecified, uncomplicated: Secondary | ICD-10-CM

## 2019-02-05 DIAGNOSIS — G4700 Insomnia, unspecified: Secondary | ICD-10-CM

## 2019-02-05 DIAGNOSIS — I10 Essential (primary) hypertension: Secondary | ICD-10-CM | POA: Diagnosis not present

## 2019-02-05 DIAGNOSIS — E782 Mixed hyperlipidemia: Secondary | ICD-10-CM | POA: Diagnosis not present

## 2019-02-05 DIAGNOSIS — R002 Palpitations: Secondary | ICD-10-CM

## 2019-02-05 DIAGNOSIS — E559 Vitamin D deficiency, unspecified: Secondary | ICD-10-CM | POA: Diagnosis not present

## 2019-02-05 DIAGNOSIS — K219 Gastro-esophageal reflux disease without esophagitis: Secondary | ICD-10-CM | POA: Diagnosis not present

## 2019-02-05 NOTE — Progress Notes (Signed)
3 Month Follow Up  I connected with  Jason Guerrero on 02/05/19 by a video enabled telemedicine application and verified that I am speaking with the correct person using two identifiers.   I discussed the limitations of evaluation and management by telemedicine. The patient expressed understanding and agreed to proceed.     Assessment and Plan:   Diagnoses and all orders for this visit:  Gastroesophageal reflux disease without esophagitis Doing well Taking prilosec 40mg   Essential hypertension  DASH diet, exercise Call if greater than 130/80.  Unable to check B/P at home Monitor when at pharmacy Follow up in 3 months  Mixed hyperlipidemia Just started taking pravachol 40mg  Taking fish oil 500mg  dialy Follow up in 3 months for labs Discussed dietary and exercise modifications  Vitamin D deficiency Continue supplementation Will check next office visit  Palpitations Doing well Pulse 84 via smart watch Taking nadolol 80mg  half tablet daily  Insomnia, unspecified type Taking Alprazolam 1mg  nightly Also taking Gabapentin 300mg   Lumbago Taking Amitriptyline 50mg  BID  Tobacco use disorder Discussed cessation Not ready to quit at this time Continue to assess readiness  Follow up in three months for follow up and labs Call or return with new or worsening symptoms  Continue diet and meds as discussed. Further disposition pending results of labs. Discussed med's effects and SE's.   Over 30 minutes of non-face to face counseling, chart review, and critical decision making was performed.   Future Appointments  Date Time Provider Department Center  09/15/2019  9:00 AM Lucky Cowboy, MD [facility] None    ----------------------------------------------------------------------------------------------------------------------  HPI 38 y.o. male  presents for 3 month follow up on HTN, HLD, insomnia, anxiety, lumbago, weight, monitoring for prediabetes and vitamin D  deficiency.  He has a 68 week old child at home, both mother and baby are healthy and back in the home.  He expressed concern for this as baby was sue during the COVID19 pandemic.  He reports that he has been working some but him and has family have been feeling well and asymptomatic. Reports overall he is doing well.  He is concerned about his insomnia as he is now taking 1mg  tablet to help with sleep along with gabapentin.  He reports the 2mg  tablet worked much better for him but has been working to decrease this, reports he has been taking for over 7 years. He has GERD and taking omeprazole.  He was switched to zantac at one point and then had to switch back related to the medication recall.   BMI is Body mass index is 23.48 kg/m., he has been working on diet and exercise. Wt Readings from Last 3 Encounters:  02/05/19 166 lb (75.3 kg)  08/25/18 157 lb 3.2 oz (71.3 kg)  04/06/18 146 lb (66.2 kg)    HTN predates 2012 His blood pressure has been controlled at home, today their BP is    He does workout. He denies any cardiac symptoms, chest pains, palpitations, shortness of breath, dizziness or lower extremity edema.     He is on cholesterol medication and taking Fish Oil 300mg  daily and pravachol. He ran out of this medication and did not have insurance.  He just tarted taking this one week ago. His cholesterol is not at goal. The cholesterol last visit was:   Lab Results  Component Value Date   CHOL 197 04/06/2018   HDL 55 04/06/2018   LDLCALC 106 (H) 04/06/2018   TRIG 235 (H) 04/06/2018   CHOLHDL  3.6 04/06/2018    He  Has been working on diet and exercise and proactively monitoring for prediabetes, and denies hyperglycemia, hypoglycemia , increased appetite, nausea, paresthesia of the feet, polydipsia and polyuria. Last A1C in the office was:  Lab Results  Component Value Date   HGBA1C 4.9 07/30/2017   Patient is on Vitamin D supplement.   Lab Results  Component Value Date   VD25OH  76 07/30/2017       Current Medications:  Current Outpatient Medications on File Prior to Visit  Medication Sig  . ALPRAZolam (XANAX) 1 MG tablet TAKE 1/2-1 TABLET BY MOUTH BEFORE SLEEP. PLEASE TRY TO LIMIT TO 5 DAYS WEEKLY TO AVOID ADDICTION  . amitriptyline (ELAVIL) 50 MG tablet TAKE 1 TABLET BY MOUTH 3 TIMES A DAY FOR CHRONIC NECK PAIN  . Cholecalciferol (VITAMIN D-3) 5000 UNITS TABS Take 1 tablet by mouth daily.   Marland Kitchen. gabapentin (NEURONTIN) 300 MG capsule TAKE 1 CAPSULE BY MOUTH 3 TIMES A DAY AS DIRECTED (Patient not taking: Reported on 08/25/2018)  . Magnesium 250 MG TABS Take 1 tablet by mouth daily.   . nadolol (CORGARD) 80 MG tablet TAKE 1/2 TO 1 TABLET BY MOUTH EVERY DAY FOR BLOOD PRESSURE AND HEART PALPITATIONS  . Omega-3 Fatty Acids (FISH OIL PO) Take 300 mg by mouth daily.  Marland Kitchen. omeprazole (PRILOSEC) 40 MG capsule Take 1 capsule daily   for Acid Reflux  . OVER THE COUNTER MEDICATION OTC Pepcid 20 mg twice a day.  . pravastatin (PRAVACHOL) 40 MG tablet Take 1 tablet (40 mg total) by mouth at bedtime.   No current facility-administered medications on file prior to visit.     Allergies:  Allergies  Allergen Reactions  . Sulfa Antibiotics   . Trazodone And Nefazodone     Severe dreams     Medical History:  Past Medical History:  Diagnosis Date  . Anxiety   . HLD (hyperlipidemia)   . Insomnia   . Lumbago   . Tension headache   . Unspecified vitamin D deficiency     Family history- Reviewed and unchanged   Social history- Reviewed and unchanged   Names of Other Physician/Practitioners you currently use: 1. [facility] here for primary care 2. Eye Exam, Due 3. Dental Exam Due  Patient Care Team: Lucky CowboyMcKeown, William, MD as PCP - General (Internal Medicine)   Screening Tests: Immunization History  Administered Date(s) Administered  . Influenza Inj Mdck Quad With Preservative 07/30/2017, 08/25/2018  . Influenza, Seasonal,  Injecte, Preservative Fre 09/07/2015  . PPD Test 06/01/2014, 06/06/2015, 06/28/2016, 07/30/2017  . Tdap 10/21/2013     Vaccinations: TD or Tdap: 2015  Influenza: 2019    Imaging: Chest X-ray: 2012 EKG: 2018     Review of Systems:  Review of Systems  Constitutional: Negative for chills, diaphoresis, fever, malaise/fatigue and weight loss.  HENT: Negative for congestion, ear discharge, ear pain, hearing loss, nosebleeds, sinus pain, sore throat and tinnitus.   Eyes: Negative for blurred vision, double vision, photophobia, pain, discharge and redness.  Respiratory: Negative for cough, hemoptysis, sputum production, shortness of breath, wheezing and stridor.   Cardiovascular: Negative for chest pain, palpitations, orthopnea, claudication, leg swelling and PND.  Gastrointestinal: Negative for abdominal pain, blood in stool, constipation, diarrhea, heartburn, melena, nausea and vomiting.  Genitourinary: Negative for dysuria, flank pain, frequency, hematuria and urgency.  Musculoskeletal: Positive for back pain. Negative for falls, joint pain, myalgias and neck pain.  Skin: Negative for itching and  rash.  Neurological: Negative for dizziness, tingling, tremors, sensory change, speech change, focal weakness, seizures, loss of consciousness, weakness and headaches.  Endo/Heme/Allergies: Negative for environmental allergies and polydipsia. Does not bruise/bleed easily.  Psychiatric/Behavioral: Negative for depression, hallucinations, memory loss, substance abuse and suicidal ideas. The patient has insomnia. The patient is not nervous/anxious.       Physical Exam: Pulse 84   Temp 98 F (36.7 C)   Wt 166 lb (75.3 kg)   BMI 23.48 kg/m  Wt Readings from Last 3 Encounters:  02/05/19 166 lb (75.3 kg)  08/25/18 157 lb 3.2 oz (71.3 kg)  04/06/18 146 lb (66.2 kg)   General : Well sounding patient in no apparent distress HEENT: no hoarseness, no cough for duration of visit Lungs: speaks  in complete sentences, no audible wheezing, no apparent distress Neurological: alert, oriented x 3 Psychiatric: pleasant, judgement appropriate     Elder Negus, NP [facility] 9:31 AM

## 2019-02-18 ENCOUNTER — Other Ambulatory Visit: Payer: Self-pay | Admitting: Adult Health

## 2019-03-01 ENCOUNTER — Other Ambulatory Visit: Payer: Self-pay | Admitting: Physician Assistant

## 2019-04-01 ENCOUNTER — Other Ambulatory Visit: Payer: Self-pay | Admitting: Internal Medicine

## 2019-05-01 ENCOUNTER — Other Ambulatory Visit: Payer: Self-pay | Admitting: Internal Medicine

## 2019-05-11 ENCOUNTER — Ambulatory Visit: Payer: BLUE CROSS/BLUE SHIELD | Admitting: Adult Health Nurse Practitioner

## 2019-05-12 NOTE — Progress Notes (Signed)
Assessment and Plan:   Diagnoses and all orders for this visit:  Gastroesophageal reflux disease without esophagitis Doing well taking prilosec 40mg ; didn't do well with ranitidine, frequent breakthrough sx Monitor mag, ca+, vit D  Essential hypertension Continue medication: nadolol Monitor blood pressure at home; call if consistently over 130/80 Continue DASH diet.   Reminder to go to the ER if any CP, SOB, nausea, dizziness, severe HA, changes vision/speech, left arm numbness and tingling and jaw pain.  Mixed hyperlipidemia Taking pravachol 40mg , omega 3 supplement Discussed dietary and exercise modifications  Vitamin D deficiency Continue supplementation; hasn't changed dose, last check at goal  Will check at CPE  Palpitations Doing well Monitors via smart watch Taking nadolol 40 mg daily  Insomnia, unspecified type Taking Alprazolam 1mg  nightly Also taking Gabapentin 300mg , hasn't tried increasing - advised to trial 600 mg  Reviewed lifestyle at length today; advised reducing alcohol intake, avoid smoking right before bed, avoid getting up to smoke  Lumbago Taking Amitriptyline 50mg  TID Has seen ortho/PT  Tobacco use disorder Discussed risks associated with tobacco use and advised to reduce or quit Patient is ready to do so and plans to taper down and use OTC patches and lozenges Declines Chantix or other medication Will follow up at the next visit  Continue diet and meds as discussed. Further disposition pending results of labs. Discussed med's effects and SE's.   Over 30 minutes of non-face to face counseling, chart review, and critical decision making was performed.   Future Appointments  Date Time Provider Department Center  09/15/2019  9:00 AM Lucky CowboyMcKeown, William, MD [facility] None    ----------------------------------------------------------------------------------------------------------------------  HPI 38 y.o. male  presents for 3 month follow up on  HTN, HLD, insomnia, anxiety, lumbago, weight, monitoring for prediabetes and vitamin D deficiency.   He has a 873 month old child at home, both mother and baby are healthy and back in the home, doing well and sleeping through the night already.   He has chronic insomnia, now taking 1mg  xanax tablet to help with sleep along with gabapentin 300 mg and benadryl. 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 Had severe dreams with trazodone.   He reports no difficulty falling asleep, has difficulty staying asleep He goes to bed around 11 pm, typically watching TV prior, will have 3-6 beers prior, will have a smoke right before bed, then will fall asleep within 5 min.  Wakes up 3-4 hours later feeling quite awake, no sense of what woke him, will get up and smoke, then takes a while to fall asleep; will wake up a few hours later feeling quite sluggish, typically 6-7 hours total.   He is also prescribed amitriptyline 50 mg TID - takes 1 tab in AM, 2 tabs at night for chronic neck pain and helps some with sleep.   He has GERD and taking omeprazole 40 mg daily.  He was switched to ranitidine but wasn't well controlled, had chest tightness, then with recall switched back and doing much better without breakthrough.   he currently continues to smoke 1.5 pack a day; discussed risks associated with smoking, patient is ready to quit, thinking of doing the patch or lozenge, plans to taper down slowly.   BMI is Body mass index is 23.31 kg/m., he has been working on diet and exercise. Wt Readings from Last  3 Encounters:  05/13/19 164 lb 12.8 oz (74.8 kg)  02/05/19 166 lb (75.3 kg)  08/25/18 157 lb 3.2 oz (71.3 kg)    HTN predates 2012 His blood pressure has been controlled at home, today their BP is BP: 108/76  He does workout. He denies any cardiac symptoms, chest pains, palpitations,  shortness of breath, dizziness or lower extremity edema.     He is on cholesterol medication and taking Fish Oil 300mg  daily and pravachol (had only taken for 1 week as of last check).His cholesterol is not at goal. The cholesterol last visit was:   Lab Results  Component Value Date   CHOL 197 04/06/2018   HDL 55 04/06/2018   LDLCALC 106 (H) 04/06/2018   TRIG 235 (H) 04/06/2018   CHOLHDL 3.6 04/06/2018    He  Has been working on diet and exercise for gluose management, and denies hyperglycemia, hypoglycemia , increased appetite, nausea, paresthesia of the feet, polydipsia and polyuria. Last A1C in the office was:  Lab Results  Component Value Date   HGBA1C 4.9 07/30/2017   Patient is on Vitamin D supplement.   Lab Results  Component Value Date   VD25OH 76 07/30/2017      Current Medications:  Current Outpatient Medications on File Prior to Visit  Medication Sig  . ALPRAZolam (XANAX) 1 MG tablet Take 1/2-1 tablet at Bedtime  ONLY if needed for Sleep &  limit to 5 days /week to avoid Addiction  . amitriptyline (ELAVIL) 50 MG tablet Take 1 tablet 3 x /day for Chronic Neck Pain  . Cholecalciferol (VITAMIN D-3) 5000 UNITS TABS Take 1 tablet by mouth daily.   Marland Kitchen gabapentin (NEURONTIN) 300 MG capsule TAKE 1 CAPSULE BY MOUTH 3 TIMES A DAY AS DIRECTED  . Magnesium 250 MG TABS Take 1 tablet by mouth daily.   . Omega-3 Fatty Acids (FISH OIL PO) Take 300 mg by mouth daily.  Marland Kitchen omeprazole (PRILOSEC) 40 MG capsule Take 1 capsule daily   for Acid Reflux  . pravastatin (PRAVACHOL) 40 MG tablet Take 1 tablet (40 mg total) by mouth at bedtime.   No current facility-administered medications on file prior to visit.     Allergies:  Allergies  Allergen Reactions  . Sulfa Antibiotics   . Trazodone And Nefazodone     Severe dreams     Medical History:  Past Medical History:  Diagnosis Date  . Anxiety   . HLD (hyperlipidemia)   . Insomnia   . Lumbago   . Tension headache   . Unspecified  vitamin D deficiency     Review of Systems:  Review of Systems  Constitutional: Negative for chills, diaphoresis, fever, malaise/fatigue and weight loss.  HENT: Negative for congestion, ear discharge, ear pain, hearing loss, nosebleeds, sinus pain, sore throat and tinnitus.   Eyes: Negative for blurred vision, double vision, photophobia, pain, discharge and redness.  Respiratory: Negative for cough, hemoptysis, sputum production, shortness of breath, wheezing and stridor.   Cardiovascular: Negative for chest pain, palpitations, orthopnea, claudication, leg swelling and PND.  Gastrointestinal: Negative for abdominal pain, blood in stool, constipation, diarrhea, heartburn, melena, nausea and vomiting.  Genitourinary: Negative for dysuria, flank pain, frequency, hematuria and urgency.  Musculoskeletal: Positive for back pain. Negative for falls, joint pain, myalgias and neck pain.  Skin: Negative for itching and rash.  Neurological: Negative for dizziness, tingling, tremors, sensory change, speech change, focal weakness, seizures, loss of consciousness, weakness and headaches.  Endo/Heme/Allergies: Negative for environmental allergies and  polydipsia. Does not bruise/bleed easily.  Psychiatric/Behavioral: Negative for depression, hallucinations, memory loss, substance abuse and suicidal ideas. The patient has insomnia. The patient is not nervous/anxious.     Physical Exam: BP 108/76   Pulse 88   Temp (!) 97.5 F (36.4 C)   Resp 16   Ht 5' 10.5" (1.791 m)   Wt 164 lb 12.8 oz (74.8 kg)   BMI 23.31 kg/m  Wt Readings from Last 3 Encounters:  05/13/19 164 lb 12.8 oz (74.8 kg)  02/05/19 166 lb (75.3 kg)  08/25/18 157 lb 3.2 oz (71.3 kg)   General Appearance: Well nourished, in no apparent distress. Eyes: PERRLA, EOMs, conjunctiva no swelling or erythema Sinuses: No Frontal/maxillary tenderness ENT/Mouth: Ext aud canals clear, TMs without erythema, bulging. No erythema, swelling, or exudate  on post pharynx.  Tonsils not swollen or erythematous. Hearing normal.  Neck: Supple, thyroid normal.  Respiratory: Respiratory effort normal, BS equal bilaterally without rales, rhonchi, wheezing or stridor.  Cardio: RRR with no MRGs. Brisk peripheral pulses without edema.  Abdomen: Soft, + BS.  Non tender, no guarding, rebound, hernias, masses. Lymphatics: Non tender without lymphadenopathy.  Musculoskeletal: Full ROM, 5/5 strength, Normal gait Skin: Warm, dry without rashes, lesions, ecchymosis.  Neuro: Cranial nerves intact. No cerebellar symptoms.  Psych: Awake and oriented X 3, normal affect, Insight and Judgment appropriate.     Dan MakerAshley C Louetta Hollingshead, NP [facility] 4:47 PM

## 2019-05-13 ENCOUNTER — Ambulatory Visit (INDEPENDENT_AMBULATORY_CARE_PROVIDER_SITE_OTHER): Payer: BLUE CROSS/BLUE SHIELD | Admitting: Adult Health

## 2019-05-13 ENCOUNTER — Encounter: Payer: Self-pay | Admitting: Adult Health

## 2019-05-13 ENCOUNTER — Other Ambulatory Visit: Payer: Self-pay

## 2019-05-13 VITALS — BP 108/76 | HR 88 | Temp 97.5°F | Resp 16 | Ht 70.5 in | Wt 164.8 lb

## 2019-05-13 DIAGNOSIS — G4700 Insomnia, unspecified: Secondary | ICD-10-CM

## 2019-05-13 DIAGNOSIS — I10 Essential (primary) hypertension: Secondary | ICD-10-CM

## 2019-05-13 DIAGNOSIS — Z79899 Other long term (current) drug therapy: Secondary | ICD-10-CM | POA: Diagnosis not present

## 2019-05-13 DIAGNOSIS — F17200 Nicotine dependence, unspecified, uncomplicated: Secondary | ICD-10-CM

## 2019-05-13 DIAGNOSIS — E782 Mixed hyperlipidemia: Secondary | ICD-10-CM | POA: Diagnosis not present

## 2019-05-13 DIAGNOSIS — E559 Vitamin D deficiency, unspecified: Secondary | ICD-10-CM

## 2019-05-13 DIAGNOSIS — K219 Gastro-esophageal reflux disease without esophagitis: Secondary | ICD-10-CM

## 2019-05-13 MED ORDER — NADOLOL 40 MG PO TABS: 40.0000 mg | ORAL_TABLET | Freq: Every day | ORAL | 1 refills | Status: DC

## 2019-05-13 NOTE — Patient Instructions (Addendum)
Recommend taking ibuprofen regularly for 2 weeks while using tennis elbow band and avoid lifting x 2 weeks, then take the medication as needed   If progressively worse, we can inject steroid to lateral elbow if needed    Tennis Elbow  Tennis elbow (lateral epicondylitis) is inflammation of tendons in your outer forearm, near your elbow. Tendons are tissues that connect muscle to bone. When you have tennis elbow, inflammation affects the tendons that you use to bend your wrist and move your hand up. Inflammation occurs in the lower part of the upper arm bone (humerus), where the tendons connect to the bone (lateral epicondyle). Tennis elbow often affects people who play tennis, but anyone may get the condition from repeatedly extending the wrist or turning the forearm. What are the causes? This condition is usually caused by repeatedly extending the wrist, turning the forearm, and using the hands. It can result from sports or work that requires repetitive forearm movements. In some cases, it may be caused by a sudden injury. What increases the risk? You are more likely to develop tennis elbow if you play tennis or another racket sport. You also have a higher risk if you frequently use your hands for work. Besides people who play tennis, others at greater risk include:  Musicians.  Carpenters, painters, and plumbers.  Cooks.  Cashiers.  People who work in [employer].  Architect workers.  Butchers.  People who use computers. What are the signs or symptoms? Symptoms of this condition include:  Pain and tenderness in the forearm and the outer part of the elbow. Pain may be felt only when using the arm, or it may be there all the time.  A burning feeling that starts in the elbow and spreads down the forearm.  A weak grip in the hand. How is this diagnosed? This condition may be diagnosed based on:  Your symptoms and medical history.  A physical exam.  X-rays.  MRI. How is  this treated? Resting and icing your arm is often the first treatment. Your health care provider may also recommend:  Medicines to reduce pain and inflammation. These may be in the form of a pill, topical gels, or shots of a steroid medicine (cortisone).  An elbow strap to reduce stress on the area.  Physical therapy. This may include massage or exercises.  An elbow brace to restrict the movements that cause symptoms. If these treatments do not help relieve your symptoms, your health care provider may recommend surgery to remove damaged muscle and reattach healthy muscle to bone. Follow these instructions at home: Activity  Rest your elbow and wrist and avoid activities that cause symptoms, as told by your health care provider.  Do physical therapy exercises as instructed.  If you lift an object, lift it with your palm facing up. This reduces stress on your elbow. Lifestyle  If your tennis elbow is caused by sports, check your equipment and make sure that: ? You are using it correctly. ? It is the best fit for you.  If your tennis elbow is caused by work or computer use, take frequent breaks to stretch your arm. Talk with your manager about ways to manage your condition at work. If you have a brace:  Wear the brace or strap as told by your health care provider. Remove it only as told by your health care provider.  Loosen the brace if your fingers tingle, become numb, or turn cold and blue.  Keep the brace clean.  If the brace is not waterproof, ask if you may remove it for bathing. If you must keep the brace on while bathing: ? Do not let it get wet. ? Cover it with a watertight covering when you take a bath or a shower. General instructions   If directed, put ice on the painful area: ? Put ice in a plastic bag. ? Place a towel between your skin and the bag. ? Leave the ice on for 20 minutes, 2-3 times a day.  Take over-the-counter and prescription medicines only as told  by your health care provider.  Keep all follow-up visits as told by your health care provider. This is important. Contact a health care provider if:  You have pain that gets worse or does not get better with treatment.  You have numbness or weakness in your forearm, hand, or fingers. Summary  Tennis elbow (lateral epicondylitis) is inflammation of tendons in your outer forearm, near your elbow.  Common symptoms include pain and tenderness in your forearm and the outer part of your elbow.  This condition is usually caused by repeatedly extending your wrist, turning your forearm, and using your hands.  The first treatment is often resting and icing your arm to relieve symptoms. Further treatment may include taking medicine, getting physical therapy, wearing a brace or strap, or having surgery. This information is not intended to replace advice given to you by your health care provider. Make sure you discuss any questions you have with your health care provider. Document Released: 09/16/2005 Document Revised: 06/12/2018 Document Reviewed: 07/01/2017 Elsevier Patient Education  2020 [employer]      Tennis Elbow Rehab Ask your health care provider which exercises are safe for you. Do exercises exactly as told by your health care provider and adjust them as directed. It is normal to feel mild stretching, pulling, tightness, or discomfort as you do these exercises. Stop right away if you feel sudden pain or your pain gets worse. Do not begin these exercises until told by your health care provider. Stretching and range-of-motion exercises These exercises warm up your muscles and joints and improve the movement and flexibility of your elbow. These exercises also help to relieve pain, numbness, and tingling. Wrist flexion, assisted  1. Straighten your left / right elbow in front of you with your palm facing down toward the floor. ? If told by your health care provider, bend your left /  right elbow to a 90-degree angle (right angle) at your side. 2. With your other hand, gently push over the back of your left / right hand so your fingers point toward the floor (flexion). Stop when you feel a gentle stretch on the back of your forearm. 3. Hold this position for __________ seconds. Repeat __________ times. Complete this exercise __________ times a day. Wrist extension, assisted  1. Straighten your left / right elbow in front of you with your palm facing up toward the ceiling. ? If told by your health care provider, bend your left / right elbow to a 90-degree angle (right angle) at your side. 2. With your other hand, gently pull your left / right hand and fingers toward the floor (extension). Stop when you feel a gentle stretch on the palm side of your forearm. 3. Hold this position for __________ seconds. Repeat __________ times. Complete this exercise __________ times a day. Assisted forearm rotation, supination 1. Sit or stand with your left / right elbow bent to a 90-degree angle (right angle) at  your side. 2. Using your uninjured hand, turn (rotate) your left / right palm up toward the ceiling (supination) until you feel a gentle stretch along the inside of your forearm. 3. Hold this position for __________ seconds. Repeat __________ times. Complete this exercise __________ times a day. Assisted forearm rotation, pronation 1. Sit or stand with your left / right elbow bent to a 90-degree angle (right angle) at your side. 2. Using your uninjured hand, rotate your left / right palm down toward the floor (pronation) until you feel a gentle stretch along the outside of your forearm. 3. Hold this position for __________ seconds. Repeat __________ times. Complete this exercise __________ times a day. Strengthening exercises These exercises build strength and endurance in your forearm and elbow. Endurance is the ability to use your muscles for a long time, even after they get  tired. Radial deviation  1. Stand with a __________ weight or a hammer in your left / right hand. Or, sit while holding a rubber exercise band or tubing, with your left / right forearm supported on a table or countertop. ? If you are standing, position your forearm so that your thumb is facing forward. If you are sitting, position your forearm so that the thumb is facing the ceiling. This is the neutral position. 2. Raise your hand upward in front of you so your thumb moves toward the ceiling (radial deviation), or pull up on the rubber tubing. Keep your forearm and elbow still while you move your wrist only. 3. Hold this position for __________ seconds. 4. Slowly return to the starting position. Repeat __________ times. Complete this exercise __________ times a day. Wrist extension, eccentric 1. Sit with your left / right forearm palm-down and supported on a table or other surface. Let your left / right wrist extend over the edge of the surface. 2. Hold a __________ weight or a piece of exercise band or tubing in your left / right hand. ? If using a rubber exercise band or tubing, hold the other end of the tubing with your other hand. 3. Use your uninjured hand to move your left / right hand up toward the ceiling. 4. Take your uninjured hand away and slowly return to the starting position using only your left / right hand. Lowering your arm under tension is called eccentric extension. Repeat __________ times. Complete this exercise __________ times a day. Wrist extension Do not do this exercise if it causes pain at the outside of your elbow. Only do this exercise once instructed by your health care provider. 1. Sit with your left / right forearm supported on a table or other surface and your palm turned down toward the floor. Let your left / right wrist extend over the edge of the surface. 2. Hold a __________ weight or a piece of rubber exercise band or tubing. ? If you are using a rubber  exercise band or tubing, hold the band or tubing in place with your other hand to provide resistance. 3. Slowly bend your wrist so your hand moves up toward the ceiling (extension). Move only your wrist, keeping your forearm and elbow still. 4. Hold this position for __________ seconds. 5. Slowly return to the starting position. Repeat __________ times. Complete this exercise __________ times a day. Forearm rotation, supination To do this exercise, you will need a lightweight hammer or rubber mallet. 1. Sit with your left / right forearm supported on a table or other surface. Bend your elbow to a 90-degree  angle (right angle). Position your forearm so that your palm is facing down toward the floor, with your hand resting over the edge of the table. 2. Hold a hammer in your left / right hand. ? To make this exercise easier, hold the hammer near the head of the hammer. ? To make this exercise harder, hold the hammer near the end of the handle. 3. Without moving your wrist or elbow, slowly rotate your forearm so your palm faces up toward the ceiling (supination). 4. Hold this position for __________ seconds. 5. Slowly return to the starting position. Repeat __________ times. Complete this exercise __________ times a day. Shoulder blade squeeze 1. Sit in a stable chair or stand with good posture. If you are sitting down, do not let your back touch the back of the chair. 2. Your arms should be at your sides with your elbows bent to a 90-degree angle (right angle). Position your forearms so that your thumbs are facing the ceiling (neutral position). 3. Without lifting your shoulders up, squeeze your shoulder blades tightly together. 4. Hold this position for __________ seconds. 5. Slowly release and return to the starting position. Repeat __________ times. Complete this exercise __________ times a day. This information is not intended to replace advice given to you by your health care provider. Make  sure you discuss any questions you have with your health care provider. Document Released: 09/16/2005 Document Revised: 01/07/2019 Document Reviewed: 11/10/2018 Elsevier Patient Education  2020 [employer] Inc.     SMOKING CESSATION  [employer] society  [phone] for more information or for a free program for smoking cessation help.   You can call QUIT SMART 1-800-QUIT-NOW for free nicotine patches or replacement therapy- if they are out- keep calling  [facility] Can call for smoking cessation classes, [phone]  If you have a smart phone, please look up Smoke Free app, this will help you stay on track and give you information about money you have saved, life that you have gained back and a ton of more information.     ADVANTAGES OF QUITTING SMOKING  Within 20 minutes, blood pressure decreases. Your pulse is at normal level.  After 8 hours, carbon monoxide levels in the blood return to normal. Your oxygen level increases.  After 24 hours, the chance of having a heart attack starts to decrease. Your breath, hair, and body stop smelling like smoke.  After 48 hours, damaged nerve endings begin to recover. Your sense of taste and smell improve.  After 72 hours, the body is virtually free of nicotine. Your bronchial tubes relax and breathing becomes easier.  After 2 to 12 weeks, lungs can hold more air. Exercise becomes easier and circulation improves.  After 1 year, the risk of coronary heart disease is cut in half.  After 5 years, the risk of stroke falls to the same as a nonsmoker.  After 10 years, the risk of lung cancer is cut in half and the risk of other cancers decreases significantly.  After 15 years, the risk of coronary heart disease drops, usually to the level of a nonsmoker.  You will have extra money to spend on things other than cigarettes.

## 2019-05-14 LAB — CBC WITH DIFFERENTIAL/PLATELET
Absolute Monocytes: 699 cells/uL (ref 200–950)
Basophils Absolute: 53 cells/uL (ref 0–200)
Basophils Relative: 0.7 %
Eosinophils Absolute: 410 cells/uL (ref 15–500)
Eosinophils Relative: 5.4 %
HCT: 47.3 % (ref 38.5–50.0)
Hemoglobin: 16.6 g/dL (ref 13.2–17.1)
Lymphs Abs: 2508 cells/uL (ref 850–3900)
MCH: 31.3 pg (ref 27.0–33.0)
MCHC: 35.1 g/dL (ref 32.0–36.0)
MCV: 89.2 fL (ref 80.0–100.0)
MPV: 9.9 fL (ref 7.5–12.5)
Monocytes Relative: 9.2 %
Neutro Abs: 3929 cells/uL (ref 1500–7800)
Neutrophils Relative %: 51.7 %
Platelets: 219 10*3/uL (ref 140–400)
RBC: 5.3 10*6/uL (ref 4.20–5.80)
RDW: 12.4 % (ref 11.0–15.0)
Total Lymphocyte: 33 %
WBC: 7.6 10*3/uL (ref 3.8–10.8)

## 2019-05-14 LAB — COMPLETE METABOLIC PANEL WITH GFR
AG Ratio: 1.7 (calc) (ref 1.0–2.5)
ALT: 17 U/L (ref 9–46)
AST: 19 U/L (ref 10–40)
Albumin: 4.6 g/dL (ref 3.6–5.1)
Alkaline phosphatase (APISO): 69 U/L (ref 36–130)
BUN: 14 mg/dL (ref 7–25)
CO2: 27 mmol/L (ref 20–32)
Calcium: 10.2 mg/dL (ref 8.6–10.3)
Chloride: 107 mmol/L (ref 98–110)
Creat: 1.04 mg/dL (ref 0.60–1.35)
GFR, Est African American: 105 mL/min/{1.73_m2} (ref >=60)
GFR, Est Non African American: 91 mL/min/{1.73_m2} (ref >=60)
Globulin: 2.7 g/dL (calc) (ref 1.9–3.7)
Glucose, Bld: 106 mg/dL — ABNORMAL HIGH (ref 65–99)
Potassium: 4.2 mmol/L (ref 3.5–5.3)
Sodium: 141 mmol/L (ref 135–146)
Total Bilirubin: 0.4 mg/dL (ref 0.2–1.2)
Total Protein: 7.3 g/dL (ref 6.1–8.1)

## 2019-05-14 LAB — LIPID PANEL
Cholesterol: 194 mg/dL (ref <200)
HDL: 61 mg/dL (ref >=40)
LDL Cholesterol (Calc): 106 mg/dL (calc) — ABNORMAL HIGH
Non-HDL Cholesterol (Calc): 133 mg/dL (calc) — ABNORMAL HIGH (ref <130)
Total CHOL/HDL Ratio: 3.2 (calc) (ref <5.0)
Triglycerides: 153 mg/dL — ABNORMAL HIGH (ref <150)

## 2019-05-14 LAB — TSH: TSH: 1.19 mIU/L (ref 0.40–4.50)

## 2019-05-14 LAB — MAGNESIUM: Magnesium: 2.3 mg/dL (ref 1.5–2.5)

## 2019-05-27 ENCOUNTER — Other Ambulatory Visit: Payer: Self-pay | Admitting: Adult Health

## 2019-05-27 MED ORDER — ALPRAZOLAM 1 MG PO TABS: ORAL_TABLET | ORAL | 0 refills | Status: DC

## 2019-05-29 ENCOUNTER — Other Ambulatory Visit: Payer: Self-pay | Admitting: Adult Health

## 2019-07-01 ENCOUNTER — Other Ambulatory Visit: Payer: Self-pay

## 2019-07-01 NOTE — Telephone Encounter (Signed)
Xanax refill request.  

## 2019-07-02 MED ORDER — ALPRAZOLAM 1 MG PO TABS: ORAL_TABLET | ORAL | 0 refills | Status: DC

## 2019-08-10 ENCOUNTER — Other Ambulatory Visit: Payer: Self-pay

## 2019-08-10 NOTE — Telephone Encounter (Signed)
Refill request for Xanax, 1mg  tablet, #20. In que for your review.

## 2019-08-13 ENCOUNTER — Other Ambulatory Visit: Payer: Self-pay | Admitting: Internal Medicine

## 2019-08-16 ENCOUNTER — Other Ambulatory Visit: Payer: Self-pay | Admitting: Adult Health

## 2019-08-16 MED ORDER — ALPRAZOLAM 1 MG PO TABS: ORAL_TABLET | ORAL | 0 refills | Status: DC

## 2019-08-20 ENCOUNTER — Other Ambulatory Visit: Payer: Self-pay | Admitting: Adult Health Nurse Practitioner

## 2019-08-20 DIAGNOSIS — F419 Anxiety disorder, unspecified: Secondary | ICD-10-CM

## 2019-08-20 MED ORDER — ALPRAZOLAM 1 MG PO TABS: ORAL_TABLET | ORAL | 0 refills | Status: DC

## 2019-09-15 ENCOUNTER — Encounter: Payer: Self-pay | Admitting: Internal Medicine

## 2019-09-17 DIAGNOSIS — L03119 Cellulitis of unspecified part of limb: Secondary | ICD-10-CM | POA: Diagnosis not present

## 2019-09-21 ENCOUNTER — Ambulatory Visit
Admission: RE | Admit: 2019-09-21 | Discharge: 2019-09-21 | Disposition: A | Discharge status: Home / Self Care | Payer: Commercial Managed Care - PPO | Source: Ambulatory Visit | Attending: Adult Health | Admitting: Adult Health

## 2019-09-21 ENCOUNTER — Encounter: Payer: Self-pay | Admitting: Adult Health

## 2019-09-21 ENCOUNTER — Ambulatory Visit (INDEPENDENT_AMBULATORY_CARE_PROVIDER_SITE_OTHER): Payer: BC Managed Care – PPO | Admitting: Adult Health

## 2019-09-21 ENCOUNTER — Other Ambulatory Visit: Payer: Self-pay

## 2019-09-21 VITALS — BP 124/74 | HR 91 | Temp 98.1°F | Wt 174.0 lb

## 2019-09-21 DIAGNOSIS — L089 Local infection of the skin and subcutaneous tissue, unspecified: Secondary | ICD-10-CM

## 2019-09-21 DIAGNOSIS — M7989 Other specified soft tissue disorders: Secondary | ICD-10-CM

## 2019-09-21 DIAGNOSIS — S60521D Blister (nonthermal) of right hand, subsequent encounter: Secondary | ICD-10-CM

## 2019-09-21 DIAGNOSIS — G5602 Carpal tunnel syndrome, left upper limb: Secondary | ICD-10-CM | POA: Diagnosis not present

## 2019-09-21 DIAGNOSIS — F419 Anxiety disorder, unspecified: Secondary | ICD-10-CM

## 2019-09-21 MED ORDER — ALPRAZOLAM 1 MG PO TABS: ORAL_TABLET | ORAL | 0 refills | Status: DC

## 2019-09-21 NOTE — Progress Notes (Signed)
Assessment and Plan:  Jason Guerrero was seen today for hand problem.  Diagnoses and all orders for this visit:  Infected blister of right hand, subsequent encounter Swelling of right hand Continue hot compresses; improving with doxycycline and steroid taper Will proceed with xray after discussion to r/o any joint involvement, osteomyelitis due to edema and purulent discharge Follow up in office if doesn't appear resolved at end of current abx taper  Low threshold for referral to hand surgeon if needed Monitor for fever/chills, erythema, edema, pain - any worsening over holiday return to UC or ED -     DG Hand Complete Right; Future  Left carpal tunnel syndrome -wear a brace at work and at night 6-8 weeks, RICE, NSAIDS, get ergonomic key board;  if not better will refer to ortho  Anxiety -     ALPRAZolam (XANAX) 1 MG tablet; Take 1/2-1 tablet at Bedtime  ONLY if needed for Sleep &  limit to 5 days /week to avoid Addiction   Further disposition pending results of labs. Discussed med's effects and SE's.   Over 15 minutes of exam, counseling, chart review, and critical decision making was performed.   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   He reports noted pimple like small bump to right base of 5th digit approx 1 week ago, no known injury or instigating event, gradually increased, appeared red and became larger and inflamed, did hot compresses and has some purulent discharge. Ended up going to UC  5 days ago on 09/17/2019, they  tried to inject/drain with minimal output, didn't get any imaging, was initiated on doxycycline and steroid taper and presents for follow up. Continues with compresses, overall less redness, less swelling; denies fever/chills or pain in hand or joint, denies weakness, numbness/tingling.  He additionally reports new intermittent numbness/tingling of the left 4th/5th and 1/2 of 3 rd digit at work and at night. He is notably newly working back in office with computer over the last month or so. Has not tried anything for this.   Past Medical History:  Diagnosis Date  . Anxiety   . HLD (hyperlipidemia)   . Insomnia   . Lumbago   . Tension headache   . Unspecified vitamin D deficiency      Allergies  Allergen Reactions  . Sulfa Antibiotics   . Trazodone And Nefazodone     Severe dreams    Current Outpatient Medications on File Prior to Visit  Medication Sig  . ALPRAZolam (XANAX) 1 MG tablet Take 1/2-1 tablet at Bedtime  ONLY if needed for Sleep &  limit to 5 days /week to avoid Addiction  . amitriptyline (ELAVIL) 50 MG tablet Take 1 tablet 3 x /day for Chronic Neck Pain  . Cholecalciferol (VITAMIN D-3) 5000 UNITS TABS Take 1 tablet by mouth daily.   Marland Kitchen gabapentin (NEURONTIN) 300 MG capsule Take 1 capsule  3 x /day as needed for Pain  . Magnesium 250 MG TABS Take 1 tablet by mouth daily.   . nadolol (CORGARD) 40 MG tablet Take 1 tablet (40  mg total) by mouth daily. (Patient taking differently: Take 40 mg by mouth daily. Takes 1/2 tablet daily)  . Omega-3 Fatty Acids (FISH OIL PO) Take 300 mg by mouth daily.  Marland Kitchen omeprazole (PRILOSEC) 40 MG capsule Take 1 capsule daily   for Acid Reflux  . pravastatin (PRAVACHOL) 40 MG tablet TAKE 1 TABLET BY MOUTH AT BEDTIME   No current facility-administered medications on file prior to visit.    ROS: all negative except above.   Physical Exam:  BP 124/74   Pulse 91   Temp 98.1 F (36.7 C)   Wt 174 lb (78.9 kg)   SpO2 99%   BMI 24.61 kg/m    General Appearance: Well nourished, in no apparent distress. Eyes: PERRLA, conjunctiva no swelling or erythema ENT/Mouth: Mark in place, oral exam deferred. Hearing normal.  Neck: Supple Respiratory: Respiratory effort normal Cardio: RRR with no MRGs. Brisk peripheral pulses without edema.  Lymphatics: Non tender without lymphadenopathy.  Musculoskeletal: Symmetrical strength, full ROM, normal gait. + Phalen's left. He has mild edema at base of right 5th digit Skin: Warm, dry; he has open wound with dried purulent appearing discharge, approx 1.5 cm round area over right 5th MCP joint with mild localized edema, minimal erythema.  Neuro: Normal muscle tone,Sensation intact excepting + phalen's left.  Psych: Awake and oriented X 3, normal affect, Insight and Judgment appropriate.     Izora Ribas, NP 2:14 PM [facility]

## 2019-09-21 NOTE — Patient Instructions (Addendum)
Please go to [address] for hand xray   Continue monitoring for worsening, redness, swelling, new pain - get checked out if any worse   Get brace for wrist - wear at work and at night   Complete prednisone taper  Then PRN NSAID - ibuprofen or aleve  Get ergonomic keyboard  If not improving let us know - can refer     Carpal Tunnel Syndrome  Carpal tunnel syndrome is a condition that causes pain in your hand and arm. The carpal tunnel is a narrow area located on the palm side of your wrist. Repeated wrist motion or certain diseases may cause swelling within the tunnel. This swelling pinches the main nerve in the wrist (median nerve). What are the causes? This condition may be caused by:  Repeated wrist motions.  Wrist injuries.  Arthritis.  A cyst or tumor in the carpal tunnel.  Fluid buildup during pregnancy. Sometimes the cause of this condition is not known. What increases the risk? The following factors may make you more likely to develop this condition:  Having a job, such as being a Research scientist (life sciences), that requires you to repeatedly move your wrist in the same motion.  Being a woman.  Having certain conditions, such as: ? Diabetes. ? Obesity. ? An underactive thyroid (hypothyroidism). ? Kidney failure. What are the signs or symptoms? Symptoms of this condition include:  A tingling feeling in your fingers, especially in your thumb, index, and middle fingers.  Tingling or numbness in your hand.  An aching feeling in your entire arm, especially when your wrist and elbow are bent for a long time.  Wrist pain that goes up your arm to your shoulder.  Pain that goes down into your palm or fingers.  A weak feeling in your hands. You may have trouble grabbing and holding items. Your symptoms may feel worse during the night. How is this diagnosed? This condition is diagnosed with a medical history and physical exam. You may also have tests,  including:  Electromyogram (EMG). This test measures electrical signals sent by your nerves into the muscles.  Nerve conduction study. This test measures how well electrical signals pass through your nerves.  Imaging tests, such as X-rays, ultrasound, and MRI. These tests check for possible causes of your condition. How is this treated? This condition may be treated with:  Lifestyle changes. It is important to stop or change the activity that caused your condition.  Doing exercise and activities to strengthen your muscles and bones (physical therapy).  Learning how to use your hand again after diagnosis (occupational therapy).  Medicines for pain and inflammation. This may include medicine that is injected into your wrist.  A wrist splint.  Surgery. Follow these instructions at home: If you have a splint:  Wear the splint as told by your health care provider. Remove it only as told by your health care provider.  Loosen the splint if your fingers tingle, become numb, or turn cold and blue.  Keep the splint clean.  If the splint is not waterproof: ? Do not let it get wet. ? Cover it with a watertight covering when you take a bath or shower. Managing pain, stiffness, and swelling   If directed, put ice on the painful area: ? If you have a removable splint, remove it as told by your health care provider. ? Put ice in a plastic bag. ? Place a towel between your skin and the bag. ? Leave the  ice on for 20 minutes, 2-3 times per day. General instructions  Take over-the-counter and prescription medicines only as told by your health care provider.  Rest your wrist from any activity that may be causing your pain. If your condition is work related, talk with your employer about changes that can be made, such as getting a wrist pad to use while typing.  Do any exercises as told by your health care provider, physical therapist, or occupational therapist.  Keep all follow-up visits  as told by your health care provider. This is important. Contact a health care provider if:  You have new symptoms.  Your pain is not controlled with medicines.  Your symptoms get worse. Get help right away if:  You have severe numbness or tingling in your wrist or hand. Summary  Carpal tunnel syndrome is a condition that causes pain in your hand and arm.  It is usually caused by repeated wrist motions.  Lifestyle changes and medicines are used to treat carpal tunnel syndrome. Surgery may be recommended.  Follow your health care provider's instructions about wearing a splint, resting from activity, keeping follow-up visits, and calling for help. This information is not intended to replace advice given to you by your health care provider. Make sure you discuss any questions you have with your health care provider. Document Released: 09/13/2000 Document Revised: 01/23/2018 Document Reviewed: 01/23/2018 Elsevier Patient Education  2020 [employer].

## 2019-10-03 ENCOUNTER — Encounter: Payer: Self-pay | Admitting: Internal Medicine

## 2019-10-03 NOTE — Progress Notes (Signed)
Annual  Screening/Preventative Visit  & Comprehensive Evaluation & Examination     This very nice 39 y.o. single WM presents for a Screening /Preventative Visit & comprehensive evaluation and management of multiple medical co-morbidities.  Patient has been followed for HTN, HLD, Prediabetes and Vitamin D Deficiency. Patient has GERD controlled on his meds. Patient relates he quit tobacco Nov 4th , 2020.     HTN predates since 2012. Patient's BP has been controlled at home.  Today's BP has a borderline high normal diastolic -  259/56. Patient denies any cardiac symptoms as chest pain, palpitations, shortness of breath, dizziness or ankle swelling.     Patient's hyperlipidemia is not controlled with diet and Pravastatin. Patient denies myalgias or other medication SE's. Last lipids were not at goal:  Lab Results  Component Value Date   CHOL 194 05/13/2019   HDL 61 05/13/2019   LDLCALC 106 (H) 05/13/2019   TRIG 153 (H) 05/13/2019   CHOLHDL 3.2 05/13/2019      Patient is monitored expectantly for glucose intolerance and patient denies reactive hypoglycemic symptoms, visual blurring, diabetic polys or paresthesias. Last A1c was Normal & at goal:  Lab Results  Component Value Date   HGBA1C 4.9 07/30/2017       Finally, patient has history of Vitamin D Deficiency  ("28" / 2012) and last vitamin D was at goal:  Lab Results  Component Value Date   VD25OH 76 07/30/2017   Current Outpatient Medications on File Prior to Visit  Medication Sig  . ALPRAZolam (XANAX) 1 MG tablet Take 1/2-1 tablet at Bedtime  ONLY if needed for Sleep &  limit to 5 days /week to avoid Addiction  . amitriptyline (ELAVIL) 50 MG tablet Take 1 tablet 3 x /day for Chronic Neck Pain  . Cholecalciferol (VITAMIN D-3) 5000 UNITS TABS Take 1 tablet by mouth daily.   Marland Kitchen gabapentin (NEURONTIN) 300 MG capsule Take 1 capsule  3 x /day as needed for Pain  . Magnesium 250 MG TABS Take 1 tablet by mouth daily.   . nadolol  (CORGARD) 40 MG tablet Take 1 tablet (40 mg total) by mouth daily. (Patient taking differently: Take 40 mg by mouth daily. Takes 1/2 tablet daily)  . Omega-3 Fatty Acids (FISH OIL PO) Take 300 mg by mouth daily.  Marland Kitchen omeprazole (PRILOSEC) 40 MG capsule Take 1 capsule daily   for Acid Reflux  . pravastatin (PRAVACHOL) 40 MG tablet TAKE 1 TABLET BY MOUTH AT BEDTIME   No current facility-administered medications on file prior to visit.   Allergies  Allergen Reactions  . Sulfa Antibiotics   . Trazodone And Nefazodone     Severe dreams   Past Medical History:  Diagnosis Date  . Anxiety   . HLD (hyperlipidemia)   . Insomnia   . Lumbago   . Tension headache   . Unspecified vitamin D deficiency    Health Maintenance  Topic Date Due  . INFLUENZA VACCINE  05/01/2019  . TETANUS/TDAP  10/22/2023  . HIV Screening  Completed   Immunization History  Administered Date(s) Administered  . Influenza Inj Mdck Quad With Preservative 07/30/2017, 08/25/2018  . Influenza, Seasonal, Injecte, Preservative Fre 09/07/2015  . PPD Test 06/01/2014, 06/06/2015, 06/28/2016, 07/30/2017  . Tdap 10/21/2013     History reviewed. No pertinent surgical history.   Family History  Problem Relation Age of Onset  . Hypertension Mother   . Diabetes Mother   . Heart disease Father   . Diabetes Father   .  Colon cancer Maternal Grandfather   . Lung cancer Paternal Grandfather   . Breast cancer Maternal Grandmother   . Ovarian cancer Paternal Grandmother   . Hyperlipidemia Father    Social History   Socioeconomic History  . Marital status: Single    Spouse name: Not on file  . Number of children: Not on file  Occupational History  .   Tobacco Use  . Smoking status: Former Smoker    Packs/day: 1.00    Types: Cigarettes    Quit date: 08/04/2019    Years since quitting: 0.1  . Smokeless tobacco: Never Used  Substance and Sexual Activity  . Alcohol use: Yes    Alcohol/week: 0.0 standard drinks     Comment: 14 beers per week  . Drug use: No  . Sexual activity: Not on file     ROS Constitutional: Denies fever, chills, weight loss/gain, headaches, insomnia,  night sweats or change in appetite. Does c/o fatigue. Eyes: Denies redness, blurred vision, diplopia, discharge, itchy or watery eyes.  ENT: Denies discharge, congestion, post nasal drip, epistaxis, sore throat, earache, hearing loss, dental pain, Tinnitus, Vertigo, Sinus pain or snoring.  Cardio: Denies chest pain, palpitations, irregular heartbeat, syncope, dyspnea, diaphoresis, orthopnea, PND, claudication or edema Respiratory: denies cough, dyspnea, DOE, pleurisy, hoarseness, laryngitis or wheezing.  Gastrointestinal: Denies dysphagia, heartburn, reflux, water brash, pain, cramps, nausea, vomiting, bloating, diarrhea, constipation, hematemesis, melena, hematochezia, jaundice or hemorrhoids Genitourinary: Denies dysuria, frequency, urgency, nocturia, hesitancy, discharge, hematuria or flank pain Musculoskeletal: Denies arthralgia, myalgia, stiffness, Jt. Swelling, pain, limp or strain/sprain. Denies Falls. Skin: Denies puritis, rash, hives, warts, acne, eczema or change in skin lesion Neuro: No weakness, tremor, incoordination, spasms, paresthesia or pain Psychiatric: Denies confusion, memory loss or sensory loss. Denies Depression. Endocrine: Denies change in weight, skin, hair change, nocturia, and paresthesia, diabetic polys, visual blurring or hyper / hypo glycemic episodes.  Heme/Lymph: No excessive bleeding, bruising or enlarged lymph nodes.  Physical Exam  BP 126/90   Pulse 80   Temp (!) 97.5 F (36.4 C)   Resp 16   Ht 5' 10.5" (1.791 m)   Wt 177 lb 12.8 oz (80.6 kg)   BMI 25.15 kg/m   General Appearance: Well nourished and well groomed and in no apparent distress.  Eyes: PERRLA, EOMs, conjunctiva no swelling or erythema, normal fundi and vessels. Sinuses: No frontal/maxillary tenderness ENT/Mouth: EACs patent /  TMs  nl. Nares clear without erythema, swelling, mucoid exudates. Oral hygiene is good. No erythema, swelling, or exudate. Tongue normal, non-obstructing. Tonsils not swollen or erythematous. Hearing normal.  Neck: Supple, thyroid not palpable. No bruits, nodes or JVD. Respiratory: Respiratory effort normal.  BS equal and clear bilateral without rales, rhonci, wheezing or stridor. Cardio: Heart sounds are normal with regular rate and rhythm and no murmurs, rubs or gallops. Peripheral pulses are normal and equal bilaterally without edema. No aortic or femoral bruits. Chest: symmetric with normal excursions and percussion.  Abdomen: Soft, with Nl bowel sounds. Nontender, no guarding, rebound, hernias, masses, or organomegaly.  Lymphatics: Non tender without lymphadenopathy.  Musculoskeletal: Full ROM all peripheral extremities, joint stability, 5/5 strength, and normal gait. Skin: Warm and dry without rashes, lesions, cyanosis, clubbing or  ecchymosis.  Neuro: Cranial nerves intact, reflexes equal bilaterally. Normal muscle tone, no cerebellar symptoms. Sensation intact.  Pysch: Alert and oriented X 3 with normal affect, insight and judgment appropriate.   Assessment and Plan  1. Annual Preventative/Screening Exam   2. Essential hypertension  - EKG  12-Lead - Urinalysis, Routine w reflex microscopic - Microalbumin / Creatinine Urine Ratio - CBC with Diff - COMPLETE METABOLIC PANEL WITH GFR - Magnesium - TSH  3. Hyperlipidemia, mixed  - EKG 12-Lead - Lipid Profile - TSH  4. Abnormal glucose  - EKG 12-Lead - Hemoglobin A1c ([facility]) - Insulin, random  5. Vitamin D deficiency  - Vitamin D (25 hydroxy)  6. Gastroesophageal reflux disease  - CBC with Diff  7. Screening for colorectal cancer  - POC Hemoccult Bld/Stl  8. Screening-pulmonary TB  - TB Skin Test  9. Screening for ischemic heart disease  - EKG 12-Lead  10. FHx: heart disease  - EKG 12-Lead  11. Former  smoker  - EKG 12-Lead  12. Fatigue  - Iron,Total/Total Iron Binding Cap - Vitamin B12 - Testosterone, Total - CBC with Diff - TSH  13. Medication management  - Urinalysis, Routine w reflex microscopic - Microalbumin / Creatinine Urine Ratio - CBC with Diff - COMPLETE METABOLIC PANEL WITH GFR - Magnesium - Lipid Profile - TSH - Hemoglobin A1c ([facility]) - Insulin, random - Vitamin D (25 hydroxy)        Patient was counseled in prudent diet, weight control to achieve/maintain BMI less than 25, BP monitoring, regular exercise and medications as discussed.  Discussed med effects and SE's. Routine screening labs and tests as requested with regular follow-up as recommended. Over 40 minutes of exam, counseling, chart review and high complex critical decision making was performed   Marinus Maw, MD

## 2019-10-03 NOTE — Patient Instructions (Signed)
Vit D  & Vit C 1,000 mg   are recommended to help protect  against the Covid-19 and other Corona viruses.    Also it's recommended  to take  Zinc 50 mg  to help  protect against the Covid-19   and best place to get  is also on [email or web address]  and don't pay more than 6-8 cents /pill !  ================================= Coronavirus (COVID-19) Are you at risk?  Are you at risk for the Coronavirus (COVID-19)?  To be considered HIGH RISK for Coronavirus (COVID-19), you have to meet the following criteria:  . Traveled to [place], [place], [place], [place] or [place]; or in the [place] to [place], [place], [place]  . or [place]; and have fever, cough, and shortness of breath within the last 2 weeks of travel OR . Been in close contact with a person diagnosed with COVID-19 within the last 2 weeks and have  . fever, cough,and shortness of breath .  . IF YOU DO NOT MEET THESE CRITERIA, YOU ARE CONSIDERED LOW RISK FOR COVID-19.  What to do if you are HIGH RISK for COVID-19?  Marland Kitchen If you are having a medical emergency, call 911. . Seek medical care right away. Before you go to a doctor's office, urgent care or emergency department, .  call ahead and tell them about your recent travel, contact with someone diagnosed with COVID-19  .  and your symptoms.  . You should receive instructions from your physician's office regarding next steps of care.  . When you arrive at healthcare provider, tell the healthcare staff immediately you have returned from  . visiting [place], [place], [place], [place] or [place]; or traveled in the [place] to [place], [place],  . [place] or [place] in the last two weeks or you have been in close contact with a person diagnosed with  . COVID-19 in the last 2 weeks.   . Tell the health care staff about your symptoms: fever, cough and shortness of breath. . After you have been seen by a medical provider, you will be either: o Tested for (COVID-19)  and discharged home on quarantine except to seek medical care if  o symptoms worsen, and asked to  - Stay home and avoid contact with others until you get your results (4-5 days)  - Avoid travel on public transportation if possible (such as bus, train, or airplane) or o Sent to the Emergency Department by EMS for evaluation, COVID-19 testing  and  o possible admission depending on your condition and test results.  What to do if you are LOW RISK for COVID-19?  Reduce your risk of any infection by using the same precautions used for avoiding the common cold or flu:  Marland Kitchen Wash your hands often with soap and warm water for at least 20 seconds.  If soap and water are not readily available,  . use an alcohol-based hand sanitizer with at least 60% alcohol.  . If coughing or sneezing, cover your mouth and nose by coughing or sneezing into the elbow areas of your shirt or coat, .  into a tissue or into your sleeve (not your hands). . Avoid shaking hands with others and consider head nods or verbal greetings only. . Avoid touching your eyes, nose, or mouth with unwashed hands.  . Avoid close contact with people who are sick. . Avoid places or events with large numbers of people in one location, like concerts or sporting events. Marland Kitchen  Carefully consider travel plans you have or are making. . If you are planning any travel outside or inside the [place], visit the CDC's Travelers' Health webpage for the latest health notices. . If you have some symptoms but not all symptoms, continue to monitor at home and seek medical attention  . if your symptoms worsen. . If you are having a medical emergency, call 911. >>>>>>>>>>>>>>>>>>>>>>>> Preventive Care for Adults  A healthy lifestyle and preventive care can promote health and wellness. Preventive health guidelines for men include the following key practices:  A routine yearly physical is a good way to check with your health care provider about your health and  preventative screening. It is a chance to share any concerns and updates on your health and to receive a thorough exam.  Visit your dentist for a routine exam and preventative care every 6 months. Brush your teeth twice a day and floss once a day. Good oral hygiene prevents tooth decay and gum disease.  The frequency of eye exams is based on your age, health, family medical history, use of contact lenses, and other factors. Follow your health care provider's recommendations for frequency of eye exams.  Eat a healthy diet. Foods such as vegetables, fruits, whole grains, low-fat dairy products, and lean protein foods contain the nutrients you need without too many calories. Decrease your intake of foods high in solid fats, added sugars, and salt. Eat the right amount of calories for you. Get information about a proper diet from your health care provider, if necessary.  Regular physical exercise is one of the most important things you can do for your health. Most adults should get at least 150 minutes of moderate-intensity exercise (any activity that increases your heart rate and causes you to sweat) each week. In addition, most adults need muscle-strengthening exercises on 2 or more days a week.  Maintain a healthy weight. The body mass index (BMI) is a screening tool to identify possible weight problems. It provides an estimate of body fat based on height and weight. Your health care provider can find your BMI and can help you achieve or maintain a healthy weight. For adults 20 years and older:  A BMI below 18.5 is considered underweight.  A BMI of 18.5 to 24.9 is normal.  A BMI of 25 to 29.9 is considered overweight.  A BMI of 30 and above is considered obese.  Maintain normal blood lipids and cholesterol levels by exercising and minimizing your intake of saturated fat. Eat a balanced diet with plenty of fruit and vegetables. Blood tests for lipids and cholesterol should begin at age 62 and be  repeated every 5 years. If your lipid or cholesterol levels are high, you are over 50, or you are at high risk for heart disease, you may need your cholesterol levels checked more frequently. Ongoing high lipid and cholesterol levels should be treated with medicines if diet and exercise are not working.  If you smoke, find out from your health care provider how to quit. If you do not use tobacco, do not start.  Lung cancer screening is recommended for adults aged 75-80 years who are at high risk for developing lung cancer because of a history of smoking. A yearly low-dose CT scan of the lungs is recommended for people who have at least a 30-pack-year history of smoking and are a current smoker or have quit within the past 15 years. A pack year of smoking is smoking an average of 1  pack of cigarettes a day for 1 year (for example: 1 pack a day for 30 years or 2 packs a day for 15 years). Yearly screening should continue until the smoker has stopped smoking for at least 15 years. Yearly screening should be stopped for people who develop a health problem that would prevent them from having lung cancer treatment.  If you choose to drink alcohol, do not have more than 2 drinks per day. One drink is considered to be 12 ounces (355 mL) of beer, 5 ounces (148 mL) of wine, or 1.5 ounces (44 mL) of liquor.  High blood pressure causes heart disease and increases the risk of stroke. Your blood pressure should be checked. Ongoing high blood pressure should be treated with medicines, if weight loss and exercise are not effective.  If you are 45-79 years old, ask your health care provider if you should take aspirin to prevent heart disease.  Diabetes screening involves taking a blood sample to check your fasting blood sugar level. Testing should be considered at a younger age or be carried out more frequently if you are overweight and have at least 1 risk factor for diabetes.  Colorectal cancer can be detected and  often prevented. Most routine colorectal cancer screening begins at the age of 50 and continues through age 75. However, your health care provider may recommend screening at an earlier age if you have risk factors for colon cancer. On a yearly basis, your health care provider may provide home test kits to check for hidden blood in the stool. Use of a small camera at the end of a tube to directly examine the colon (sigmoidoscopy or colonoscopy) can detect the earliest forms of colorectal cancer. Talk to your health care provider about this at age 50, when routine screening begins. Direct exam of the colon should be repeated every 5-10 years through age 75, unless early forms of precancerous polyps or small growths are found.  Screening for abdominal aortic aneurysm (AAA)  are recommended for persons over age 50 who have history of hypertensionor who are current or former smokers.  Talk with your health care provider about prostate cancer screening.  Testicular cancer screening is recommended for adult males. Screening includes self-exam, a health care provider exam, and other screening tests. Consult with your health care provider about any symptoms you have or any concerns you have about testicular cancer.  Use sunscreen. Apply sunscreen liberally and repeatedly throughout the day. You should seek shade when your shadow is shorter than you. Protect yourself by wearing long sleeves, pants, a wide-brimmed hat, and sunglasses year round, whenever you are outdoors.  Once a month, do a whole-body skin exam, using a mirror to look at the skin on your back. Tell your health care provider about new moles, moles that have irregular borders, moles that are larger than a pencil eraser, or moles that have changed in shape or color.  Stay current with required vaccines (immunizations).  Influenza vaccine. All adults should be immunized every year.  Tetanus, diphtheria, and acellular pertussis (Td, Tdap) vaccine.  An adult who has not previously received Tdap or who does not know his vaccine status should receive 1 dose of Tdap. This initial dose should be followed by tetanus and diphtheria toxoids (Td) booster doses every 10 years. Adults with an unknown or incomplete history of completing a 3-dose immunization series with Td-containing vaccines should begin or complete a primary immunization series including a Tdap dose. Adults should receive a   Td booster every 10 years.  Zoster vaccine. One dose is recommended for adults aged 69 years or older unless certain conditions are present.    Pneumococcal 13-valent conjugate (PCV13) vaccine. When indicated, a person who is uncertain of his immunization history and has no record of immunization should receive the PCV13 vaccine. An adult aged 11 years or older who has certain medical conditions and has not been previously immunized should receive 1 dose of PCV13 vaccine. This PCV13 should be followed with a dose of pneumococcal polysaccharide (PPSV23) vaccine. The PPSV23 vaccine dose should be obtained at least 8 weeks after the dose of PCV13 vaccine. An adult aged 41 years or older who has certain medical conditions and previously received 1 or more doses of PPSV23 vaccine should receive 1 dose of PCV13. The PCV13 vaccine dose should be obtained 1 or more years after the last PPSV23 vaccine dose.    Pneumococcal polysaccharide (PPSV23) vaccine. When PCV13 is also indicated, PCV13 should be obtained first. All adults aged 90 years and older should be immunized. An adult younger than age 67 years who has certain medical conditions should be immunized. Any person who resides in a nursing home or long-term care facility should be immunized. An adult smoker should be immunized. People with an immunocompromised condition and certain other conditions should receive both PCV13 and PPSV23 vaccines. People with human immunodeficiency virus (HIV) infection should be immunized as  soon as possible after diagnosis. Immunization during chemotherapy or radiation therapy should be avoided. Routine use of PPSV23 vaccine is not recommended for American Indians, [place] Natives, or people younger than 65 years unless there are medical conditions that require PPSV23 vaccine. When indicated, people who have unknown immunization and have no record of immunization should receive PPSV23 vaccine. One-time revaccination 5 years after the first dose of PPSV23 is recommended for people aged 19-64 years who have chronic kidney failure, nephrotic syndrome, asplenia, or immunocompromised conditions. People who received 1-2 doses of PPSV23 before age 57 years should receive another dose of PPSV23 vaccine at age 29 years or later if at least 5 years have passed since the previous dose. Doses of PPSV23 are not needed for people immunized with PPSV23 at or after age 48 years.  Hepatitis A vaccine. Adults who wish to be protected from this disease, have certain high-risk conditions, work with hepatitis A-infected animals, work in hepatitis A research labs, or travel to or work in countries with a high rate of hepatitis A should be immunized. Adults who were previously unvaccinated and who anticipate close contact with an international adoptee during the first 60 days after arrival in the [place] from a country with a high rate of hepatitis A should be immunized.  Hepatitis B vaccine. Adults should be immunized if they wish to be protected from this disease, have certain high-risk conditions, may be exposed to blood or other infectious body fluids, are household contacts or sex partners of hepatitis B positive people, are clients or workers in certain care facilities, or travel to or work in countries with a high rate of hepatitis B.  Preventive Service / Frequency  Ages 40 to 19  Blood pressure check.  Lipid and cholesterol check.  Hepatitis C blood test.** / For any individual with known risks  for hepatitis C.  Skin self-exam. / Monthly.  Influenza vaccine. / Every year.  Tetanus, diphtheria, and acellular pertussis (Tdap, Td) vaccine.** / Consult your health care provider. 1 dose of Td every 10 years.  HPV vaccine. / 3 doses over 6 months, if 43 or younger.  Measles, mumps, rubella (MMR) vaccine.** / You need at least 1 dose of MMR if you were born in 1957 or later. You may also need a second dose.  Pneumococcal 13-valent conjugate (PCV13) vaccine.** / Consult your health care provider.  Pneumococcal polysaccharide (PPSV23) vaccine.** / 1 to 2 doses if you smoke cigarettes or if you have certain conditions.  Meningococcal vaccine.** / 1 dose if you are age 35 to 96 years and a Market researcher living in a residence hall, or have one of several medical conditions. You may also need additional booster doses.  Hepatitis A vaccine.** / Consult your health care provider.  Hepatitis B vaccine.** / Consult your health care provider. +++++++++ Recommend Adult Low Dose Aspirin or  coated  Aspirin 81 mg daily  To reduce risk of Colon Cancer 40 %,  Skin Cancer 26 % ,  Melanoma 46%  and  Pancreatic cancer 60% ++++++++++++++++++ Vitamin D goal  is between 70-100.  Please make sure that you are taking your Vitamin D as directed.  It is very important as a natural anti-inflammatory  helping hair, skin, and nails, as well as reducing stroke and heart attack risk.  It helps your bones and helps with mood. It also decreases numerous cancer risks so please take it as directed.  Low Vit D is associated with a 200-300% higher risk for CANCER  and 200-300% higher risk for HEART   ATTACK  &  STROKE.   .....................................Marland Kitchen It is also associated with higher death rate at younger ages,  autoimmune diseases like Rheumatoid arthritis, Lupus, Multiple Sclerosis.    Also many other serious conditions, like depression, Alzheimer's Dementia, infertility, muscle  aches, fatigue, fibromyalgia - just to name a few. +++++++++++++++++++++ Recommend the book "The END of DIETING" by Dr Excell Seltzer  & the book "The END of DIABETES " by Dr Excell Seltzer At [email or web address].com - get book & Audio CD's    Being diabetic has a  300% increased risk for heart attack, stroke, cancer, and alzheimer- type vascular dementia. It is very important that you work harder with diet by avoiding all foods that are white. Avoid white rice (brown & wild rice is OK), white potatoes (sweetpotatoes in moderation is OK), White bread or wheat bread or anything made out of white flour like bagels, donuts, rolls, buns, biscuits, cakes, pastries, cookies, pizza crust, and pasta (made from white flour & egg whites) - vegetarian pasta or spinach or wheat pasta is OK. Multigrain breads like Arnold's or [employer], or multigrain sandwich thins or flatbreads.  Diet, exercise and weight loss can reverse and cure diabetes in the early stages.  Diet, exercise and weight loss is very important in the control and prevention of complications of diabetes which affects every system in your body, ie. Brain - dementia/stroke, eyes - glaucoma/blindness, heart - heart attack/heart failure, kidneys - dialysis, stomach - gastric paralysis, intestines - malabsorption, nerves - severe painful neuritis, circulation - gangrene & loss of a leg(s), and finally cancer and Alzheimers.    I recommend avoid fried & greasy foods,  sweets/candy, white rice (brown or wild rice or Quinoa is OK), white potatoes (sweet potatoes are OK) - anything made from white flour - bagels, doughnuts, rolls, buns, biscuits,white and wheat breads, pizza crust and traditional pasta made of white flour & egg white(vegetarian pasta or spinach or wheat pasta is OK).  Multi-grain bread is OK -  like multi-grain flat bread or sandwich thins. Avoid alcohol in excess. Exercise is also important.    Eat all the vegetables you want - avoid meat, especially red  meat and dairy - especially cheese.  Cheese is the most concentrated form of trans-fats which is the worst thing to clog up our arteries. Veggie cheese is OK which can be found in the fresh produce section at [employer] or [employer] or [employer]  +++++++++++++++++++ DASH Eating Plan  DASH stands for "Dietary Approaches to Stop Hypertension."   The DASH eating plan is a healthy eating plan that has been shown to reduce high blood pressure (hypertension). Additional health benefits may include reducing the risk of type 2 diabetes mellitus, heart disease, and stroke. The DASH eating plan may also help with weight loss. WHAT DO I NEED TO KNOW ABOUT THE DASH EATING PLAN? For the DASH eating plan, you will follow these general guidelines:  Choose foods with a percent daily value for sodium of less than 5% (as listed on the food label).  Use salt-free seasonings or herbs instead of table salt or sea salt.  Check with your health care provider or pharmacist before using salt substitutes.  Eat lower-sodium products, often labeled as "lower sodium" or "no salt added."  Eat fresh foods.  Eat more vegetables, fruits, and low-fat dairy products.  Choose whole grains. Look for the word "whole" as the first word in the ingredient list.  Choose fish   Limit sweets, desserts, sugars, and sugary drinks.  Choose heart-healthy fats.  Eat veggie cheese   Eat more home-cooked food and less restaurant, buffet, and fast food.  Limit fried foods.  Cook foods using methods other than frying.  Limit canned vegetables. If you do use them, rinse them well to decrease the sodium.  When eating at a restaurant, ask that your food be prepared with less salt, or no salt if possible.                      WHAT FOODS CAN I EAT? Read Dr Fara Olden Fuhrman's books on The End of Dieting & The End of Diabetes  Grains Whole grain or whole wheat bread. Brown rice. Whole grain or whole wheat pasta. Quinoa, bulgur,  and whole grain cereals. Low-sodium cereals. Corn or whole wheat flour tortillas. Whole grain cornbread. Whole grain crackers. Low-sodium crackers.  Vegetables Fresh or frozen vegetables (raw, steamed, roasted, or grilled). Low-sodium or reduced-sodium tomato and vegetable juices. Low-sodium or reduced-sodium tomato sauce and paste. Low-sodium or reduced-sodium canned vegetables.   Fruits All fresh, canned (in natural juice), or frozen fruits.  Protein Products  All fish and seafood.  Dried beans, peas, or lentils. Unsalted nuts and seeds. Unsalted canned beans.  Dairy Low-fat dairy products, such as skim or 1% milk, 2% or reduced-fat cheeses, low-fat ricotta or cottage cheese, or plain low-fat yogurt. Low-sodium or reduced-sodium cheeses.  Fats and Oils Tub margarines without trans fats. Light or reduced-fat mayonnaise and salad dressings (reduced sodium). Avocado. Safflower, olive, or canola oils. Natural peanut or almond butter.  Other Unsalted popcorn and pretzels. The items listed above may not be a complete list of recommended foods or beverages. Contact your dietitian for more options.  +++++++++++++++++++  WHAT FOODS ARE NOT RECOMMENDED? Grains/ White flour or wheat flour White bread. White pasta. White rice. Refined cornbread. Bagels and croissants. Crackers that contain trans fat.  Vegetables  Creamed or fried vegetables. Vegetables in a . Regular canned vegetables.  Regular canned tomato sauce and paste. Regular tomato and vegetable juices.  Fruits Dried fruits. Canned fruit in light or heavy syrup. Fruit juice.  Meat and Other Protein Products Meat in general - RED meat & White meat.  Fatty cuts of meat. Ribs, chicken wings, all processed meats as bacon, sausage, bologna, salami, fatback, hot dogs, bratwurst and packaged luncheon meats.  Dairy Whole or 2% milk, cream, half-and-half, and cream cheese. Whole-fat or sweetened yogurt. Full-fat cheeses or blue cheese.  Non-dairy creamers and whipped toppings. Processed cheese, cheese spreads, or cheese curds.  Condiments Onion and garlic salt, seasoned salt, table salt, and sea salt. Canned and packaged gravies. Worcestershire sauce. Tartar sauce. Barbecue sauce. Teriyaki sauce. Soy sauce, including reduced sodium. Steak sauce. Fish sauce. Oyster sauce. Cocktail sauce. Horseradish. Ketchup and mustard. Meat flavorings and tenderizers. Bouillon cubes. Hot sauce. Tabasco sauce. Marinades. Taco seasonings. Relishes.  Fats and Oils Butter, stick margarine, lard, shortening and bacon fat. Coconut, palm kernel, or palm oils. Regular salad dressings.  Pickles and olives. Salted popcorn and pretzels.  The items listed above may not be a complete list of foods and beverages to avoid.

## 2019-10-04 ENCOUNTER — Ambulatory Visit (INDEPENDENT_AMBULATORY_CARE_PROVIDER_SITE_OTHER): Payer: BC Managed Care – PPO | Admitting: Internal Medicine

## 2019-10-04 ENCOUNTER — Other Ambulatory Visit: Payer: Self-pay

## 2019-10-04 VITALS — BP 126/90 | HR 80 | Temp 97.5°F | Resp 16 | Ht 70.5 in | Wt 177.8 lb

## 2019-10-04 DIAGNOSIS — Z136 Encounter for screening for cardiovascular disorders: Secondary | ICD-10-CM | POA: Diagnosis not present

## 2019-10-04 DIAGNOSIS — Z1329 Encounter for screening for other suspected endocrine disorder: Secondary | ICD-10-CM

## 2019-10-04 DIAGNOSIS — Z1211 Encounter for screening for malignant neoplasm of colon: Secondary | ICD-10-CM

## 2019-10-04 DIAGNOSIS — Z131 Encounter for screening for diabetes mellitus: Secondary | ICD-10-CM

## 2019-10-04 DIAGNOSIS — Z13220 Encounter for screening for lipoid disorders: Secondary | ICD-10-CM

## 2019-10-04 DIAGNOSIS — Z0001 Encounter for general adult medical examination with abnormal findings: Secondary | ICD-10-CM

## 2019-10-04 DIAGNOSIS — Z0000 Encounter for general adult medical examination without abnormal findings: Secondary | ICD-10-CM | POA: Diagnosis not present

## 2019-10-04 DIAGNOSIS — Z79899 Other long term (current) drug therapy: Secondary | ICD-10-CM

## 2019-10-04 DIAGNOSIS — E559 Vitamin D deficiency, unspecified: Secondary | ICD-10-CM

## 2019-10-04 DIAGNOSIS — I10 Essential (primary) hypertension: Secondary | ICD-10-CM

## 2019-10-04 DIAGNOSIS — Z87891 Personal history of nicotine dependence: Secondary | ICD-10-CM

## 2019-10-04 DIAGNOSIS — R7309 Other abnormal glucose: Secondary | ICD-10-CM

## 2019-10-04 DIAGNOSIS — Z111 Encounter for screening for respiratory tuberculosis: Secondary | ICD-10-CM

## 2019-10-04 DIAGNOSIS — Z1389 Encounter for screening for other disorder: Secondary | ICD-10-CM

## 2019-10-04 DIAGNOSIS — Z130 Encounter for screening for diseases of the blood and blood-forming organs and certain disorders involving the immune mechanism: Secondary | ICD-10-CM | POA: Diagnosis not present

## 2019-10-04 DIAGNOSIS — K219 Gastro-esophageal reflux disease without esophagitis: Secondary | ICD-10-CM

## 2019-10-04 DIAGNOSIS — R5383 Other fatigue: Secondary | ICD-10-CM

## 2019-10-04 DIAGNOSIS — E782 Mixed hyperlipidemia: Secondary | ICD-10-CM

## 2019-10-04 DIAGNOSIS — Z8249 Family history of ischemic heart disease and other diseases of the circulatory system: Secondary | ICD-10-CM

## 2019-10-05 ENCOUNTER — Other Ambulatory Visit: Payer: Self-pay | Admitting: Internal Medicine

## 2019-10-05 DIAGNOSIS — E782 Mixed hyperlipidemia: Secondary | ICD-10-CM

## 2019-10-05 LAB — CBC WITH DIFFERENTIAL/PLATELET
Absolute Monocytes: 616 cells/uL (ref 200–950)
Basophils Absolute: 73 cells/uL (ref 0–200)
Basophils Relative: 0.9 %
Eosinophils Absolute: 381 cells/uL (ref 15–500)
Eosinophils Relative: 4.7 %
HCT: 45.7 % (ref 38.5–50.0)
Hemoglobin: 15.8 g/dL (ref 13.2–17.1)
Lymphs Abs: 2778 cells/uL (ref 850–3900)
MCH: 30.2 pg (ref 27.0–33.0)
MCHC: 34.6 g/dL (ref 32.0–36.0)
MCV: 87.2 fL (ref 80.0–100.0)
MPV: 9.6 fL (ref 7.5–12.5)
Monocytes Relative: 7.6 %
Neutro Abs: 4253 cells/uL (ref 1500–7800)
Neutrophils Relative %: 52.5 %
Platelets: 237 10*3/uL (ref 140–400)
RBC: 5.24 10*6/uL (ref 4.20–5.80)
RDW: 12.4 % (ref 11.0–15.0)
Total Lymphocyte: 34.3 %
WBC: 8.1 10*3/uL (ref 3.8–10.8)

## 2019-10-05 LAB — COMPLETE METABOLIC PANEL WITH GFR
AG Ratio: 1.5 (calc) (ref 1.0–2.5)
ALT: 23 U/L (ref 9–46)
AST: 21 U/L (ref 10–40)
Albumin: 4.6 g/dL (ref 3.6–5.1)
Alkaline phosphatase (APISO): 57 U/L (ref 36–130)
BUN: 14 mg/dL (ref 7–25)
CO2: 30 mmol/L (ref 20–32)
Calcium: 10.2 mg/dL (ref 8.6–10.3)
Chloride: 102 mmol/L (ref 98–110)
Creat: 0.8 mg/dL (ref 0.60–1.35)
GFR, Est African American: 131 mL/min/{1.73_m2} (ref >=60)
GFR, Est Non African American: 113 mL/min/{1.73_m2} (ref >=60)
Globulin: 3 g/dL (calc) (ref 1.9–3.7)
Glucose, Bld: 97 mg/dL (ref 65–99)
Potassium: 4.2 mmol/L (ref 3.5–5.3)
Sodium: 140 mmol/L (ref 135–146)
Total Bilirubin: 0.8 mg/dL (ref 0.2–1.2)
Total Protein: 7.6 g/dL (ref 6.1–8.1)

## 2019-10-05 LAB — URINALYSIS, ROUTINE W REFLEX MICROSCOPIC
Bilirubin Urine: NEGATIVE
Glucose, UA: NEGATIVE
Hgb urine dipstick: NEGATIVE
Ketones, ur: NEGATIVE
Leukocytes,Ua: NEGATIVE
Nitrite: NEGATIVE
Protein, ur: NEGATIVE
Specific Gravity, Urine: 1.009 (ref 1.001–1.03)
pH: 7 (ref 5.0–8.0)

## 2019-10-05 LAB — MAGNESIUM: Magnesium: 2.2 mg/dL (ref 1.5–2.5)

## 2019-10-05 LAB — LIPID PANEL
Cholesterol: 242 mg/dL — ABNORMAL HIGH (ref <200)
HDL: 59 mg/dL (ref >=40)
LDL Cholesterol (Calc): 157 mg/dL (calc) — ABNORMAL HIGH
Non-HDL Cholesterol (Calc): 183 mg/dL (calc) — ABNORMAL HIGH (ref <130)
Total CHOL/HDL Ratio: 4.1 (calc) (ref <5.0)
Triglycerides: 139 mg/dL (ref <150)

## 2019-10-05 LAB — HEMOGLOBIN A1C
Hgb A1c MFr Bld: 5.3 % of total Hgb (ref <5.7)
Mean Plasma Glucose: 105 (calc)
eAG (mmol/L): 5.8 (calc)

## 2019-10-05 LAB — VITAMIN D 25 HYDROXY (VIT D DEFICIENCY, FRACTURES): Vit D, 25-Hydroxy: 52 ng/mL (ref 30–100)

## 2019-10-05 LAB — MICROALBUMIN / CREATININE URINE RATIO
Creatinine, Urine: 43 mg/dL (ref 20–320)
Microalb, Ur: <0.2 mg/dL

## 2019-10-05 LAB — VITAMIN B12: Vitamin B-12: 599 pg/mL (ref 200–1100)

## 2019-10-05 LAB — IRON, TOTAL/TOTAL IRON BINDING CAP
%SAT: 66 % (calc) — ABNORMAL HIGH (ref 20–48)
Iron: 204 ug/dL — ABNORMAL HIGH (ref 50–180)
TIBC: 311 mcg/dL (calc) (ref 250–425)

## 2019-10-05 LAB — INSULIN, RANDOM: Insulin: 2.2 u[IU]/mL

## 2019-10-05 LAB — TESTOSTERONE: Testosterone: 291 ng/dL (ref 250–827)

## 2019-10-05 LAB — TSH: TSH: 1.48 mIU/L (ref 0.40–4.50)

## 2019-10-05 MED ORDER — ROSUVASTATIN CALCIUM 20 MG PO TABS: ORAL_TABLET | ORAL | 3 refills | Status: DC

## 2019-11-02 ENCOUNTER — Other Ambulatory Visit: Payer: Self-pay | Admitting: Adult Health

## 2019-11-02 DIAGNOSIS — F419 Anxiety disorder, unspecified: Secondary | ICD-10-CM

## 2019-11-05 ENCOUNTER — Other Ambulatory Visit: Payer: Self-pay | Admitting: Internal Medicine

## 2019-11-05 DIAGNOSIS — K219 Gastro-esophageal reflux disease without esophagitis: Secondary | ICD-10-CM

## 2019-11-08 ENCOUNTER — Other Ambulatory Visit: Payer: Self-pay | Admitting: Adult Health

## 2019-11-08 MED ORDER — NADOLOL 40 MG PO TABS: 20.0000 mg | ORAL_TABLET | Freq: Every day | ORAL | 1 refills | Status: DC

## 2019-12-14 ENCOUNTER — Other Ambulatory Visit: Payer: Self-pay | Admitting: Internal Medicine

## 2019-12-14 DIAGNOSIS — F419 Anxiety disorder, unspecified: Secondary | ICD-10-CM

## 2019-12-14 MED ORDER — ALPRAZOLAM 1 MG PO TABS: ORAL_TABLET | ORAL | 0 refills | Status: DC

## 2020-01-05 NOTE — Progress Notes (Signed)
Assessment and Plan:   Diagnoses and all orders for this visit:  Gastroesophageal reflux disease without esophagitis Doing well taking prilosec 40mg ; didn't do well with ranitidine, frequent breakthrough sx Monitor mag, ca+, vit D  Essential hypertension Continue medication: nadolol Monitor blood pressure at home; call if consistently over 130/80 Continue DASH diet.   Reminder to go to the ER if any CP, SOB, nausea, dizziness, severe HA, changes vision/speech, left arm numbness and tingling and jaw pain.  Mixed hyperlipidemia Taking rosuvastatin 20mg , omega 3 supplement Discussed dietary and exercise modifications  Vitamin D deficiency Continue supplementation; hasn't changed dose, last check at goal  Will check at CPE  Palpitations Doing well Monitors via smart watch Taking nadolol 40 mg daily May be able to taper off now that no longer smoking and if can reduce alcohol intake -   Insomnia, unspecified type Taking Alprazolam 1mg , gabapentin, amitriptyline nightly Reviewed lifestyle at length today; advised reducing alcohol intake  Alcohol use with sleep disorder (HCC) Long hx of insomnia complicated by excess alcohol intake Reiterated excess alcohol intake in the evening prevents deep sleep and likely contributing to his symptoms Discussed appropriate daily intake should be limited to 1-2 drinks per day He expresses understanding and is receptive to cutting back; strategies discussed. Declines medications to assist.   Lumbago Taking Amitriptyline 50mg  TID, gabapentin Has seen ortho/PT  Tobacco use disorder Discussed risks associated with tobacco use and advised to reduce or quit Patient is ready to do so and plans to taper down and use OTC patches and lozenges Declines Chantix or other medication Will follow up at the next visit    Left ulnar neuropathy  Intermittent since 2017, recent episode persistent since 08/2019 Has seen Dr. 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 Will refer back to [facility] if can do in house vs neuro  Continue diet and meds as discussed. Further disposition pending results of labs. Discussed med's effects and SE's.   Over 30 minutes of non-face to face counseling, chart review, and critical decision making was performed.   Future Appointments  Date Time Provider Department Center  04/11/2020  9:30 AM 10/2019, MD [facility] None  10/09/2020  9:00 AM 2018, MD [facility] None    ----------------------------------------------------------------------------------------------------------------------  HPI 39 y.o. male  presents for 3 month follow up on HTN, HLD, insomnia, anxiety, lumbago, weight, monitoring for prediabetes and vitamin D deficiency.   Son will be 1 in 2 weeks, doing well. 68 year old daughter.   Quit smoking in November 2020, taking oral nicotine for now, slowly reducing.   He has chronic insomnia, now taking 1/2 tab of 1 mg xanax tablet to help with sleep along with gabapentin 300 mg and benadryl. Had severe dreams with trazodone. He is also prescribed amitriptyline 50 mg TID - takes 1 tab in AM, 2 tabs at night for chronic neck pain and helps some with sleep. He reports no difficulty falling asleep, has difficulty staying asleep. Admits to drinking 6 beers per night, has noted improvement in sleep quality with reduced alcohol in the past.   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 Has tried a carpal  tunnel brace without improvement. No known injury, denies pain in elbow, wrist. He dos have hx of pain though recently improved, typically muscular stiffness and has done some PT with ortho with improvement. He was evaluated by [facility] Dr. Lynann Bologna in 11/2015 for neck pain attributed to kyphosis, reported left forearm numbness/tingling at the time, cervical MRI from 10/2015 apparently showed minimal left sided neuroforaminal stenosis at C6-7, not felt to be likely contributing to his neuropathic sx. He was offered nerve conduction/EMG with specialist Dr. Grandville Silos at the time but declined as sx were not concerning or bothersome to him at that time. He does agree sx with current episode are similar to that time, now interested in pursuing these studies.   He has GERD and taking omeprazole 40 mg daily with well controlled  Failed attempt to taper with ranitidine.    BMI is Body mass index is 25.72 kg/m., he has been working on diet and exercise.  No soda. Trying to eat better and more consistently. No intentional exercise but chasing kids around.  Wt Readings from Last 3 Encounters:  01/06/20 181 lb 12.8 oz (82.5 kg)  10/04/19 177 lb 12.8 oz (80.6 kg)  09/21/19 174 lb (78.9 kg)    HTN predates 2012 His blood pressure has been controlled at home, today their BP is BP: 110/76  He does workout. He denies any cardiac symptoms, chest pains, palpitations (controlled on nadolol), shortness of breath, dizziness or lower extremity edema.    He is on cholesterol medication and taking Fish Oil 300mg  daily and newly on rosuvastatin 20 mg daily, previously on pravastatin).His cholesterol is not at goal. The cholesterol last visit was:   Lab Results  Component Value Date   CHOL 242 (H) 10/04/2019   HDL 59 10/04/2019   LDLCALC 157 (H) 10/04/2019   TRIG 139 10/04/2019   CHOLHDL 4.1 10/04/2019    He  Has been working on diet and exercise for gluose management, and denies hyperglycemia, hypoglycemia ,  increased appetite, nausea, paresthesia of the feet, polydipsia and polyuria. Last A1C in the office was:  Lab Results  Component Value Date   HGBA1C 5.3 10/04/2019   Patient is on Vitamin D supplement.   Lab Results  Component Value Date   VD25OH 52 10/04/2019      Current Medications:  Current Outpatient Medications on File Prior to Visit  Medication Sig  . ALPRAZolam (XANAX) 1 MG tablet Take 1/2-1 tablet at Bedtime ONLY if needed for Sleep &  limit to 5 days /week to avoid Addiction & Dementia  . amitriptyline (ELAVIL) 50 MG tablet Take 1 tablet 3 x /day for Chronic Neck Pain  . Cholecalciferol (VITAMIN D-3) 5000 UNITS TABS Take 1 tablet by mouth daily.   Marland Kitchen gabapentin (NEURONTIN) 300 MG capsule Take 1 capsule  3 x /day as needed for Pain  . Magnesium 250 MG TABS Take 1 tablet by mouth daily.   . Omega-3 Fatty Acids (FISH OIL PO) Take 300 mg by mouth daily.  Marland Kitchen omeprazole (PRILOSEC) 40 MG capsule Take 1 capsule by mouth once daily  . rosuvastatin (CRESTOR) 20 MG tablet Take 1 tablet Daily for Cholesterol  . nadolol (CORGARD) 40 MG tablet Take 0.5-1 tablets (20-40 mg total) by mouth daily.   No current facility-administered medications on file prior to visit.    Allergies:  Allergies  Allergen Reactions  . Sulfa Antibiotics   . Trazodone And Nefazodone     Severe dreams     Medical History:  Past Medical History:  Diagnosis Date  . Anxiety   . HLD (hyperlipidemia)   . Insomnia   . Lumbago   . Tension headache   . Unspecified vitamin D deficiency     Review of Systems:  Review of Systems  Constitutional: Negative for chills, diaphoresis, fever, malaise/fatigue and weight loss.  HENT: Negative for congestion, ear discharge, ear pain, hearing loss, nosebleeds, sinus pain, sore throat and tinnitus.   Eyes: Negative for blurred vision, double vision, photophobia, pain, discharge and redness.  Respiratory: Negative for cough, hemoptysis, sputum production, shortness of  breath, wheezing and stridor.   Cardiovascular: Negative for chest pain, palpitations, orthopnea, claudication, leg swelling and PND.  Gastrointestinal: Negative for abdominal pain, blood in stool, constipation, diarrhea, heartburn, melena, nausea and vomiting.  Genitourinary: Negative for dysuria, flank pain, frequency, hematuria and urgency.  Musculoskeletal: Negative for back pain, falls, joint pain, myalgias and neck pain (intermittent neck tightness).  Skin: Negative for itching and rash.  Neurological: Positive for tingling (left 4/5th digits ) and sensory change (left 4th/5th digitis). Negative for dizziness, tremors, speech change, focal weakness, seizures, loss of consciousness, weakness and headaches.  Endo/Heme/Allergies: Negative for environmental allergies and polydipsia. Does not bruise/bleed easily.  Psychiatric/Behavioral: Positive for substance abuse (6 beers nightly ). Negative for depression, hallucinations, memory loss and suicidal ideas. The patient has insomnia. The patient is not nervous/anxious.     Physical Exam: BP 110/76   Pulse 96   Temp (!) 97.5 F (36.4 C)   Ht 5' 10.5" (1.791 m)   Wt 181 lb 12.8 oz (82.5 kg)   SpO2 99%   BMI 25.72 kg/m  Wt Readings from Last 3 Encounters:  01/06/20 181 lb 12.8 oz (82.5 kg)  10/04/19 177 lb 12.8 oz (80.6 kg)  09/21/19 174 lb (78.9 kg)   General Appearance: Well nourished, in no apparent distress. Eyes: PERRLA, EOMs, conjunctiva no swelling or erythema Sinuses: No Frontal/maxillary tenderness ENT/Mouth: Ext aud canals clear, TMs without erythema, bulging. No erythema, swelling, or exudate on post pharynx.  Tonsils not swollen or erythematous. Hearing normal.  Neck: Supple, thyroid normal.  Respiratory: Respiratory effort normal, BS equal bilaterally without rales, rhonchi, wheezing or stridor.  Cardio: RRR with no MRGs. Brisk peripheral pulses without edema.  Abdomen: Soft, + BS.  Non tender, no guarding, rebound,  hernias, masses. Lymphatics: Non tender without lymphadenopathy.  Musculoskeletal: Full ROM through neck, bil shoulder, elbows, wrists. No palpable abnormality. 5/5 strength symmetrical throughout, no notable atrophy of left extremity. Normal gait Skin: Warm, dry without rashes, lesions, ecchymosis.  Neuro: Cranial nerves intact. No cerebellar symptoms. Decreased sensation of left hand 4th/5th digits and lateral 3rd digit palmar and dorsal surfaces extending to wrist.  Psych: Awake and oriented X 3, normal affect, Insight and Judgment appropriate.     Dan Maker, NP [facility] 9:27 AM

## 2020-01-06 ENCOUNTER — Encounter: Payer: Self-pay | Admitting: Adult Health

## 2020-01-06 ENCOUNTER — Ambulatory Visit (INDEPENDENT_AMBULATORY_CARE_PROVIDER_SITE_OTHER): Payer: BC Managed Care – PPO | Admitting: Adult Health

## 2020-01-06 ENCOUNTER — Other Ambulatory Visit: Payer: Self-pay

## 2020-01-06 VITALS — BP 110/76 | HR 96 | Temp 97.5°F | Ht 70.5 in | Wt 181.8 lb

## 2020-01-06 DIAGNOSIS — E782 Mixed hyperlipidemia: Secondary | ICD-10-CM | POA: Diagnosis not present

## 2020-01-06 DIAGNOSIS — I10 Essential (primary) hypertension: Secondary | ICD-10-CM | POA: Diagnosis not present

## 2020-01-06 DIAGNOSIS — E559 Vitamin D deficiency, unspecified: Secondary | ICD-10-CM

## 2020-01-06 DIAGNOSIS — Z79899 Other long term (current) drug therapy: Secondary | ICD-10-CM | POA: Diagnosis not present

## 2020-01-06 DIAGNOSIS — K219 Gastro-esophageal reflux disease without esophagitis: Secondary | ICD-10-CM | POA: Diagnosis not present

## 2020-01-06 DIAGNOSIS — F17200 Nicotine dependence, unspecified, uncomplicated: Secondary | ICD-10-CM

## 2020-01-06 DIAGNOSIS — F10982 Alcohol use, unspecified with alcohol-induced sleep disorder: Secondary | ICD-10-CM

## 2020-01-06 DIAGNOSIS — G5622 Lesion of ulnar nerve, left upper limb: Secondary | ICD-10-CM

## 2020-01-06 DIAGNOSIS — G4700 Insomnia, unspecified: Secondary | ICD-10-CM

## 2020-01-06 MED ORDER — PREDNISONE 20 MG PO TABS: ORAL_TABLET | ORAL | 0 refills | Status: DC

## 2020-01-06 NOTE — Patient Instructions (Addendum)
Goals    . Reduce alcohol intake     Try to cut back to 2 beers per night    . Weight (lb) < 170 lb (77.1 kg)         Slowly reduce alcohol     Drink 1/2 your body weight in fluid ounces of water daily; drink a tall glass of water 30 min before meals  Don't eat until you're stuffed- listen to your stomach and eat until you are 80% full   Try eating off of a salad plate; wait 10 min after finishing before going back for seconds  Start by eating the vegetables on your plate; aim for 35% of your meals to be fruits or vegetables  Then eat your protein - lean meats (grass fed if possible), fish, beans, nuts in moderation  Eat your carbs/starch last ONLY if you still are hungry. If you can, stop before finishing it all  Avoid sugar and flour - the closer it looks to it's original form in nature, typically the better it is for you  Splurge in moderation - "assign" days when you get to splurge and have the "bad stuff" - I like to follow a 80% - 20% plan- "good" choices 80 % of the time, "bad" choices in moderation 20% of the time  Simple equation is: Calories out > calories in = weight loss - even if you eat the bad stuff, if you limit portions, you will still lose weight       When it comes to diets, agreement about the perfect plan isn't easy to find, even among the experts. Experts at the [facility] [employer] developed an idea known as the Healthy Eating Plate. Just imagine a plate divided into logical, healthy portions.  The emphasis is on diet quality:  Load up on vegetables and fruits - one-half of your plate: Aim for color and variety, and remember that potatoes don't count.  Go for whole grains - one-quarter of your plate: Whole wheat, barley, wheat berries, quinoa, oats, brown rice, and foods made with them. If you want pasta, go with whole wheat pasta.  Protein power - one-quarter of your plate: Fish, chicken, beans, and nuts are all healthy, versatile  protein sources. Limit red meat.  The diet, however, does go beyond the plate, offering a few other suggestions.  Use healthy plant oils, such as olive, canola, soy, corn, sunflower and peanut. Check the labels, and avoid partially hydrogenated oil, which have unhealthy trans fats.  If you're thirsty, drink water. Coffee and tea are good in moderation, but skip sugary drinks and limit milk and dairy products to one or two daily servings.  The type of carbohydrate in the diet is more important than the amount. Some sources of carbohydrates, such as vegetables, fruits, whole grains, and beans--are healthier than others.  Finally, stay active.

## 2020-01-07 LAB — MAGNESIUM: Magnesium: 2.2 mg/dL (ref 1.5–2.5)

## 2020-01-07 LAB — LIPID PANEL
Cholesterol: 172 mg/dL (ref <200)
HDL: 51 mg/dL (ref >=40)
LDL Cholesterol (Calc): 80 mg/dL (calc)
Non-HDL Cholesterol (Calc): 121 mg/dL (calc) (ref <130)
Total CHOL/HDL Ratio: 3.4 (calc) (ref <5.0)
Triglycerides: 342 mg/dL — ABNORMAL HIGH (ref <150)

## 2020-01-07 LAB — CBC WITH DIFFERENTIAL/PLATELET
Absolute Monocytes: 632 cells/uL (ref 200–950)
Basophils Absolute: 48 cells/uL (ref 0–200)
Basophils Relative: 0.7 %
Eosinophils Absolute: 354 cells/uL (ref 15–500)
Eosinophils Relative: 5.2 %
HCT: 46.1 % (ref 38.5–50.0)
Hemoglobin: 15.8 g/dL (ref 13.2–17.1)
Lymphs Abs: 2312 cells/uL (ref 850–3900)
MCH: 31 pg (ref 27.0–33.0)
MCHC: 34.3 g/dL (ref 32.0–36.0)
MCV: 90.4 fL (ref 80.0–100.0)
MPV: 9.5 fL (ref 7.5–12.5)
Monocytes Relative: 9.3 %
Neutro Abs: 3454 cells/uL (ref 1500–7800)
Neutrophils Relative %: 50.8 %
Platelets: 214 10*3/uL (ref 140–400)
RBC: 5.1 10*6/uL (ref 4.20–5.80)
RDW: 12.5 % (ref 11.0–15.0)
Total Lymphocyte: 34 %
WBC: 6.8 10*3/uL (ref 3.8–10.8)

## 2020-01-07 LAB — COMPLETE METABOLIC PANEL WITH GFR
AG Ratio: 1.4 (calc) (ref 1.0–2.5)
ALT: 26 U/L (ref 9–46)
AST: 19 U/L (ref 10–40)
Albumin: 4.4 g/dL (ref 3.6–5.1)
Alkaline phosphatase (APISO): 66 U/L (ref 36–130)
BUN: 17 mg/dL (ref 7–25)
CO2: 28 mmol/L (ref 20–32)
Calcium: 10.3 mg/dL (ref 8.6–10.3)
Chloride: 103 mmol/L (ref 98–110)
Creat: 0.87 mg/dL (ref 0.60–1.35)
GFR, Est African American: 126 mL/min/{1.73_m2} (ref >=60)
GFR, Est Non African American: 109 mL/min/{1.73_m2} (ref >=60)
Globulin: 3.1 g/dL (calc) (ref 1.9–3.7)
Glucose, Bld: 84 mg/dL (ref 65–99)
Potassium: 4.2 mmol/L (ref 3.5–5.3)
Sodium: 138 mmol/L (ref 135–146)
Total Bilirubin: 0.5 mg/dL (ref 0.2–1.2)
Total Protein: 7.5 g/dL (ref 6.1–8.1)

## 2020-01-07 LAB — TSH: TSH: 1.98 mIU/L (ref 0.40–4.50)

## 2020-01-25 DIAGNOSIS — G5622 Lesion of ulnar nerve, left upper limb: Secondary | ICD-10-CM | POA: Diagnosis not present

## 2020-01-26 ENCOUNTER — Other Ambulatory Visit: Payer: Self-pay | Admitting: Internal Medicine

## 2020-01-26 DIAGNOSIS — F419 Anxiety disorder, unspecified: Secondary | ICD-10-CM

## 2020-01-26 MED ORDER — ALPRAZOLAM 1 MG PO TABS: ORAL_TABLET | ORAL | 0 refills | Status: DC

## 2020-03-06 ENCOUNTER — Other Ambulatory Visit: Payer: Self-pay | Admitting: Internal Medicine

## 2020-03-06 DIAGNOSIS — F419 Anxiety disorder, unspecified: Secondary | ICD-10-CM

## 2020-03-06 MED ORDER — ALPRAZOLAM 1 MG PO TABS: ORAL_TABLET | ORAL | 0 refills | Status: DC

## 2020-04-06 ENCOUNTER — Other Ambulatory Visit: Payer: Self-pay | Admitting: Adult Health

## 2020-04-06 DIAGNOSIS — F419 Anxiety disorder, unspecified: Secondary | ICD-10-CM

## 2020-04-06 MED ORDER — ALPRAZOLAM 1 MG PO TABS: ORAL_TABLET | ORAL | 0 refills | Status: DC

## 2020-04-11 ENCOUNTER — Ambulatory Visit: Payer: BC Managed Care – PPO | Admitting: Internal Medicine

## 2020-04-18 ENCOUNTER — Encounter: Payer: Self-pay | Admitting: Internal Medicine

## 2020-04-18 NOTE — Progress Notes (Signed)
History of Present Illness:       This very nice 39 y.o.  Single WM presents for 6 month follow up with HTN, HLD, Pre-Diabetes and Vitamin D Deficiency. Patient's GERD is controlled on his meds.      Patient is treated for HTN (2012)  & BP has been controlled at home. Todays BP is at goal - 120/88. Patient has had no complaints of any cardiac type chest pain, palpitations, dyspnea / orthopnea / PND, dizziness, claudication, or dependent edema.      Hyperlipidemia is controlled with diet & Rosuvastatin. Patient denies myalgias or other med SEs. Last Lipids were at goal except elevated Trig's:  Lab Results  Component Value Date   CHOL 172 01/06/2020   HDL 51 01/06/2020   LDLCALC 80 01/06/2020   TRIG 342 (H) 01/06/2020   CHOLHDL 3.4 01/06/2020    Also, the patient is expectantly monitored for glucose intolerance  and has had no symptoms of reactive hypoglycemia, diabetic polys, paresthesias or visual blurring.  Last A1c was Normal & at goal:  Lab Results  Component Value Date   HGBA1C 5.3 10/04/2019           Further, the patient also has history of Vitamin D Deficiency ("28" / 2012)  and supplements vitamin D without any suspected side-effects. Last vitamin D was near goal:  Lab Results  Component Value Date   VD25OH 52 10/04/2019    Current Outpatient Medications on File Prior to Visit  Medication Sig   ALPRAZolam (XANAX) 1 MG tablet Take 1/2-1 tablet at Bedtime ONLY if needed for Sleep &  limit to 5 days /week to avoid Addiction & Dementia   amitriptyline (ELAVIL) 50 MG tablet Take 1 tablet 3 x /day for Chronic Neck Pain   Cholecalciferol (VITAMIN D-3) 5000 UNITS TABS Take 1 tablet by mouth daily.    gabapentin (NEURONTIN) 300 MG capsule Take 1 capsule  3 x /day as needed for Pain   Magnesium 250 MG TABS Take 1 tablet by mouth daily.    nadolol (CORGARD) 40 MG tablet Take 0.5-1 tablets (20-40 mg total) by mouth daily.   Omega-3 Fatty Acids (FISH OIL PO) Take  300 mg by mouth daily.   omeprazole (PRILOSEC) 40 MG capsule Take 1 capsule by mouth once daily   rosuvastatin (CRESTOR) 20 MG tablet Take 1 tablet Daily for Cholesterol   No current facility-administered medications on file prior to visit.    Allergies  Allergen Reactions   Sulfa Antibiotics    Trazodone And Nefazodone     Severe dreams    PMHx:   Past Medical History:  Diagnosis Date   Anxiety    HLD (hyperlipidemia)    Insomnia    Lumbago    Tension headache    Unspecified vitamin D deficiency     Immunization History  Administered Date(s) Administered   Influenza Inj Mdck Quad With Preservative 07/30/2017, 08/25/2018   Influenza, Seasonal, Injecte, Preservative Fre 09/07/2015   PPD Test 06/01/2014, 06/06/2015, 06/28/2016, 07/30/2017, 10/04/2019   Tdap 10/21/2013    History reviewed. No pertinent surgical history.  FHx:    Reviewed / unchanged  SHx:    Reviewed / unchanged   Systems Review:  Constitutional: Denies fever, chills, wt changes, headaches, insomnia, fatigue, night sweats, change in appetite. Eyes: Denies redness, blurred vision, diplopia, discharge, itchy, watery eyes.  ENT: Denies discharge, congestion, post nasal drip, epistaxis, sore throat, earache, hearing loss, dental pain, tinnitus, vertigo,  sinus pain, snoring.  CV: Denies chest pain, palpitations, irregular heartbeat, syncope, dyspnea, diaphoresis, orthopnea, PND, claudication or edema. Respiratory: denies cough, dyspnea, DOE, pleurisy, hoarseness, laryngitis, wheezing.  Gastrointestinal: Denies dysphagia, odynophagia, heartburn, reflux, water brash, abdominal pain or cramps, nausea, vomiting, bloating, diarrhea, constipation, hematemesis, melena, hematochezia  or hemorrhoids. Genitourinary: Denies dysuria, frequency, urgency, nocturia, hesitancy, discharge, hematuria or flank pain. Musculoskeletal: Denies arthralgias, myalgias, stiffness, jt. swelling, pain, limping or  strain/sprain.  Skin: Denies pruritus, rash, hives, warts, acne, eczema or change in skin lesion(s). Neuro: No weakness, tremor, incoordination, spasms, paresthesia or pain. Psychiatric: Denies confusion, memory loss or sensory loss. Endo: Denies change in weight, skin or hair change.  Heme/Lymph: No excessive bleeding, bruising or enlarged lymph nodes.  Physical Exam  BP 120/88    Pulse 72    Temp (!) 97.2 F (36.2 C)    Resp 16    Ht 5' 10.5" (1.791 m)    Wt 184 lb 12.8 oz (83.8 kg)    BMI 26.14 kg/m   Appears  well nourished, well groomed  and in no distress.  Eyes: PERRLA, EOMs, conjunctiva no swelling or erythema. Sinuses: No frontal/maxillary tenderness ENT/Mouth: EAC's clear, TM's nl w/o erythema, bulging. Nares clear w/o erythema, swelling, exudates. Oropharynx clear without erythema or exudates. Oral hygiene is good. Tongue normal, non obstructing. Hearing intact.  Neck: Supple. Thyroid not palpable. Car 2+/2+ without bruits, nodes or JVD. Chest: Respirations nl with BS clear & equal w/o rales, rhonchi, wheezing or stridor.  Cor: Heart sounds normal w/ regular rate and rhythm without sig. murmurs, gallops, clicks or rubs. Peripheral pulses normal and equal  without edema.  Abdomen: Soft & bowel sounds normal. Non-tender w/o guarding, rebound, hernias, masses or organomegaly.  Lymphatics: Unremarkable.  Musculoskeletal: Full ROM all peripheral extremities, joint stability, 5/5 strength and normal gait.  Skin: Warm, dry without exposed rashes, lesions or ecchymosis apparent.  Neuro: Cranial nerves intact, reflexes equal bilaterally. Sensory-motor testing grossly intact. Tendon reflexes grossly intact.  Pysch: Alert & oriented x 3.  Insight and judgement nl & appropriate. No ideations.  Assessment and Plan:  1. Essential hypertension  - Continue medication, monitor blood pressure at home.  - Continue DASH diet.  Reminder to go to the ER if any CP,  SOB, nausea, dizziness,  severe HA, changes vision/speech.  - CBC with Differential/Platelet - COMPLETE METABOLIC PANEL WITH GFR - Magnesium - TSH  - Continue diet/meds, exercise,& lifestyle modifications.  - Continue monitor periodic cholesterol/liver & renal functions   2. Hyperlipidemia, mixed  - Lipid panel - TSH  3. Abnormal glucose  - Continue diet, exercise  - Lifestyle modifications.  - Monitor appropriate labs.  - Hemoglobin A1c - Insulin, random  4. Vitamin D deficiency  - Continue supplementation.  - VITAMIN D 25 Hydroxy  5. Gastroesophageal reflux disease  - CBC with Differential/Platelet  6. Medication management  - CBC with Differential/Platelet - COMPLETE METABOLIC PANEL WITH GFR - Magnesium - Lipid panel - TSH - Hemoglobin A1c - Insulin, random - VITAMIN D 25 Hydroxy        Discussed  regular exercise, BP monitoring, weight control to achieve/maintain BMI less than 25 and discussed med and SE's. Recommended labs to assess and monitor clinical status with further disposition pending results of labs.  I discussed the assessment and treatment plan with the patient. The patient was provided an opportunity to ask questions and all were answered. The patient agreed with the plan and demonstrated an understanding of  the instructions.  I provided over 30 minutes of exam, counseling, chart review and  complex critical decision making.         The patient was advised to call back or seek an in-person evaluation if the symptoms worsen or if the condition fails to improve as anticipated.   Marinus Maw, MD

## 2020-04-18 NOTE — Patient Instructions (Signed)

## 2020-04-19 ENCOUNTER — Ambulatory Visit (INDEPENDENT_AMBULATORY_CARE_PROVIDER_SITE_OTHER): Payer: BC Managed Care – PPO | Admitting: Internal Medicine

## 2020-04-19 ENCOUNTER — Other Ambulatory Visit: Payer: Self-pay

## 2020-04-19 VITALS — BP 120/88 | HR 72 | Temp 97.2°F | Resp 16 | Ht 70.5 in | Wt 184.8 lb

## 2020-04-19 DIAGNOSIS — R7309 Other abnormal glucose: Secondary | ICD-10-CM | POA: Diagnosis not present

## 2020-04-19 DIAGNOSIS — E559 Vitamin D deficiency, unspecified: Secondary | ICD-10-CM

## 2020-04-19 DIAGNOSIS — E782 Mixed hyperlipidemia: Secondary | ICD-10-CM | POA: Diagnosis not present

## 2020-04-19 DIAGNOSIS — I10 Essential (primary) hypertension: Secondary | ICD-10-CM

## 2020-04-19 DIAGNOSIS — K219 Gastro-esophageal reflux disease without esophagitis: Secondary | ICD-10-CM

## 2020-04-19 DIAGNOSIS — Z79899 Other long term (current) drug therapy: Secondary | ICD-10-CM | POA: Diagnosis not present

## 2020-04-20 LAB — COMPLETE METABOLIC PANEL WITH GFR
AG Ratio: 1.6 (calc) (ref 1.0–2.5)
ALT: 36 U/L (ref 9–46)
AST: 25 U/L (ref 10–40)
Albumin: 5 g/dL (ref 3.6–5.1)
Alkaline phosphatase (APISO): 76 U/L (ref 36–130)
BUN: 12 mg/dL (ref 7–25)
CO2: 30 mmol/L (ref 20–32)
Calcium: 10.1 mg/dL (ref 8.6–10.3)
Chloride: 102 mmol/L (ref 98–110)
Creat: 0.92 mg/dL (ref 0.60–1.35)
GFR, Est African American: 121 mL/min/{1.73_m2} (ref >=60)
GFR, Est Non African American: 104 mL/min/{1.73_m2} (ref >=60)
Globulin: 3.2 g/dL (calc) (ref 1.9–3.7)
Glucose, Bld: 106 mg/dL — ABNORMAL HIGH (ref 65–99)
Potassium: 4.8 mmol/L (ref 3.5–5.3)
Sodium: 137 mmol/L (ref 135–146)
Total Bilirubin: 0.6 mg/dL (ref 0.2–1.2)
Total Protein: 8.2 g/dL — ABNORMAL HIGH (ref 6.1–8.1)

## 2020-04-20 LAB — HEMOGLOBIN A1C
Hgb A1c MFr Bld: 5.1 % of total Hgb (ref <5.7)
Mean Plasma Glucose: 100 (calc)
eAG (mmol/L): 5.5 (calc)

## 2020-04-20 LAB — CBC WITH DIFFERENTIAL/PLATELET
Absolute Monocytes: 642 cells/uL (ref 200–950)
Basophils Absolute: 58 cells/uL (ref 0–200)
Basophils Relative: 0.8 %
Eosinophils Absolute: 350 cells/uL (ref 15–500)
Eosinophils Relative: 4.8 %
HCT: 47.5 % (ref 38.5–50.0)
Hemoglobin: 16.5 g/dL (ref 13.2–17.1)
Lymphs Abs: 2307 cells/uL (ref 850–3900)
MCH: 30.8 pg (ref 27.0–33.0)
MCHC: 34.7 g/dL (ref 32.0–36.0)
MCV: 88.8 fL (ref 80.0–100.0)
MPV: 9.8 fL (ref 7.5–12.5)
Monocytes Relative: 8.8 %
Neutro Abs: 3942 cells/uL (ref 1500–7800)
Neutrophils Relative %: 54 %
Platelets: 246 10*3/uL (ref 140–400)
RBC: 5.35 10*6/uL (ref 4.20–5.80)
RDW: 12.2 % (ref 11.0–15.0)
Total Lymphocyte: 31.6 %
WBC: 7.3 10*3/uL (ref 3.8–10.8)

## 2020-04-20 LAB — LIPID PANEL
Cholesterol: 170 mg/dL (ref <200)
HDL: 50 mg/dL (ref >=40)
LDL Cholesterol (Calc): 97 mg/dL (calc)
Non-HDL Cholesterol (Calc): 120 mg/dL (calc) (ref <130)
Total CHOL/HDL Ratio: 3.4 (calc) (ref <5.0)
Triglycerides: 125 mg/dL (ref <150)

## 2020-04-20 LAB — MAGNESIUM: Magnesium: 2.3 mg/dL (ref 1.5–2.5)

## 2020-04-20 LAB — TSH: TSH: 2.64 mIU/L (ref 0.40–4.50)

## 2020-04-20 LAB — VITAMIN D 25 HYDROXY (VIT D DEFICIENCY, FRACTURES): Vit D, 25-Hydroxy: 50 ng/mL (ref 30–100)

## 2020-04-20 LAB — INSULIN, RANDOM: Insulin: 5.7 u[IU]/mL

## 2020-04-20 NOTE — Progress Notes (Signed)
==========================================================  -    Total Chol = 170 and LDL Chol = 97 - Both  Excellent   - Very low risk for Heart Attack  / Stroke =============================================================  - A1c - Normal - great - No Diabetes ==========================================================  -  Vit %0 - a little low , but OK   - Vitamin D goal is between 70-100.   - Please INCREASE your Vitamin D  to 10,000 units /day  - It is very important as a natural anti-inflammatory and helping the  immune system protect against viral infections, like the Covid-19    helping hair, skin, and nails, as well as reducing stroke and heart attack risk.   - It helps your bones and helps with mood.  - It also decreases numerous cancer risks so please take it as directed.   - Low Vit D is associated with a 200-300% higher risk for CANCER   and 200-300% higher risk for HEART   ATTACK  &  STROKE.    - It is also associated with higher death rate at younger ages,   autoimmune diseases like Rheumatoid arthritis, Lupus, Multiple Sclerosis.     - Also many other serious conditions, like depression, Alzheimer's  Dementia, infertility, muscle aches, fatigue, fibromyalgia - just to name a few.  ==========================================================  -  All Else - CBC - Kidneys - Electrolytes - Liver - Magnesium & Thyroid    - all  Normal / OK ==========================================================

## 2020-04-24 ENCOUNTER — Other Ambulatory Visit: Payer: Self-pay | Admitting: Adult Health

## 2020-04-24 DIAGNOSIS — K219 Gastro-esophageal reflux disease without esophagitis: Secondary | ICD-10-CM

## 2020-05-04 ENCOUNTER — Other Ambulatory Visit: Payer: Self-pay | Admitting: Adult Health

## 2020-05-26 ENCOUNTER — Other Ambulatory Visit: Payer: Self-pay | Admitting: Internal Medicine

## 2020-05-26 DIAGNOSIS — F419 Anxiety disorder, unspecified: Secondary | ICD-10-CM

## 2020-05-26 MED ORDER — ALPRAZOLAM 1 MG PO TABS: ORAL_TABLET | ORAL | 0 refills | Status: DC

## 2020-06-24 ENCOUNTER — Telehealth: Payer: Self-pay | Admitting: Physician Assistant

## 2020-06-24 ENCOUNTER — Other Ambulatory Visit: Payer: Self-pay | Admitting: Internal Medicine

## 2020-06-24 MED ORDER — DEXAMETHASONE 4 MG PO TABS: ORAL_TABLET | ORAL | 0 refills | Status: DC

## 2020-06-24 NOTE — Telephone Encounter (Addendum)
Called to discuss with Janeann Merl about Covid symptoms and the use of casirivimab/imdevimab, a monoclonal antibody infusion for those with mild to moderate Covid symptoms and at a high risk of hospitalization.     Pt is qualified for this infusion at the monoclonal antibody infusion center due to co-morbid conditions and/or a member of an at-risk group (BMI and HTN), however would like to think more about the infusion at this time. Symptoms tier reviewed as well as criteria for ending isolation.  Symptoms reviewed that would warrant ED/Hospital evaluation. Preventative practices reviewed. Patient verbalized understanding. Patient advised to call back if he decides that he does want to get infusion. Callback number to the infusion center given. Patient advised to go to Urgent care or ED with severe symptoms. Last date pt would be eligible for infusion is 10/3.   Pt is worried about the potential cost and would like to think about it. I sent him a Clinical cytogeneticist message with more information.   Patient Active Problem List   Diagnosis Date Noted  . Alcohol use with alcohol-induced sleep disorder (HCC) 01/06/2020  . Ulnar neuropathy of left upper extremity 01/06/2020  . Gastroesophageal reflux disease without esophagitis 08/25/2018  . Insomnia 04/01/2018  . Tobacco use disorder 08/31/2014  . Medication management 06/01/2014  . Vitamin D deficiency   . Palpitations 06/23/2013  . Essential hypertension 06/23/2013  . Hyperlipidemia 06/23/2013    [name] PA-C

## 2020-06-26 ENCOUNTER — Other Ambulatory Visit: Payer: Self-pay | Admitting: Physician Assistant

## 2020-06-26 DIAGNOSIS — I10 Essential (primary) hypertension: Secondary | ICD-10-CM

## 2020-06-26 DIAGNOSIS — F17200 Nicotine dependence, unspecified, uncomplicated: Secondary | ICD-10-CM

## 2020-06-26 DIAGNOSIS — U071 COVID-19: Secondary | ICD-10-CM

## 2020-06-26 NOTE — Progress Notes (Signed)
I connected by phone with Jason Guerrero on 06/26/2020 at 2:36 PM to discuss the potential use of a new treatment for mild to moderate COVID-19 viral infection in non-hospitalized patients.  This patient is a 39 y.o. male that meets the FDA criteria for Emergency Use Authorization of COVID monoclonal antibody casirivimab/imdevimab.  Has a (+) direct SARS-CoV-2 viral test result  Has mild or moderate COVID-19   Is NOT hospitalized due to COVID-19  Is within 10 days of symptom onset  Has at least one of the high risk factor(s) for progression to severe COVID-19 and/or hospitalization as defined in EUA.  Specific high risk criteria : BMI > 25 and Cardiovascular disease or hypertension   I have spoken and communicated the following to the patient or parent/caregiver regarding COVID monoclonal antibody treatment:  1. FDA has authorized the emergency use for the treatment of mild to moderate COVID-19 in adults and pediatric patients with positive results of direct SARS-CoV-2 viral testing who are 78 years of age and older weighing at least 40 kg, and who are at high risk for progressing to severe COVID-19 and/or hospitalization.  2. The significant known and potential risks and benefits of COVID monoclonal antibody, and the extent to which such potential risks and benefits are unknown.  3. Information on available alternative treatments and the risks and benefits of those alternatives, including clinical trials.  4. Patients treated with COVID monoclonal antibody should continue to self-isolate and use infection control measures (e.g., wear mask, isolate, social distance, avoid sharing personal items, clean and disinfect "high touch" surfaces, and frequent handwashing) according to CDC guidelines.   5. The patient or parent/caregiver has the option to accept or refuse COVID monoclonal antibody treatment.  After reviewing this information with the patient, the patient has agreed to receive one  of the available covid 19 monoclonal antibodies and will be provided an appropriate fact sheet prior to infusion.  Sx onset 9/23. Set up for infusion on 9/28 @ 8:30am. Directions given to [name]. Pt is aware that insurance will be charged an infusion fee. Pt is unvaccinated.   [name] 06/26/2020 2:36 PM

## 2020-06-27 ENCOUNTER — Ambulatory Visit (HOSPITAL_COMMUNITY)
Admission: RE | Admit: 2020-06-27 | Discharge: 2020-06-27 | Disposition: A | Discharge status: Home / Self Care | Payer: BC Managed Care – PPO | Source: Ambulatory Visit | Attending: Pulmonary Disease | Admitting: Pulmonary Disease

## 2020-06-27 ENCOUNTER — Other Ambulatory Visit (HOSPITAL_COMMUNITY): Payer: Self-pay

## 2020-06-27 DIAGNOSIS — F17200 Nicotine dependence, unspecified, uncomplicated: Secondary | ICD-10-CM

## 2020-06-27 DIAGNOSIS — I10 Essential (primary) hypertension: Secondary | ICD-10-CM | POA: Diagnosis not present

## 2020-06-27 DIAGNOSIS — U071 COVID-19: Secondary | ICD-10-CM | POA: Diagnosis not present

## 2020-06-27 MED ORDER — ALBUTEROL SULFATE HFA 108 (90 BASE) MCG/ACT IN AERS: 2.0000 | INHALATION_SPRAY | Freq: Once | RESPIRATORY_TRACT | Status: DC | PRN

## 2020-06-27 MED ORDER — SODIUM CHLORIDE 0.9 % IV SOLN
1200.0000 mg | Freq: Once | INTRAVENOUS | Status: AC
  Administered 2020-06-27: 1200 mg via INTRAVENOUS

## 2020-06-27 MED ORDER — FAMOTIDINE IN NACL 20-0.9 MG/50ML-% IV SOLN: 20.0000 mg | Freq: Once | INTRAVENOUS | Status: DC | PRN

## 2020-06-27 MED ORDER — EPINEPHRINE 0.3 MG/0.3ML IJ SOAJ: 0.3000 mg | Freq: Once | INTRAMUSCULAR | Status: DC | PRN

## 2020-06-27 MED ORDER — METHYLPREDNISOLONE SODIUM SUCC 125 MG IJ SOLR: 125.0000 mg | Freq: Once | INTRAMUSCULAR | Status: DC | PRN

## 2020-06-27 MED ORDER — SODIUM CHLORIDE 0.9 % IV SOLN: INTRAVENOUS | Status: DC | PRN

## 2020-06-27 MED ORDER — DIPHENHYDRAMINE HCL 50 MG/ML IJ SOLN: 50.0000 mg | Freq: Once | INTRAMUSCULAR | Status: DC | PRN

## 2020-06-27 NOTE — Discharge Instructions (Signed)
COVID-19 COVID-19 is a respiratory infection that is caused by a virus called severe acute respiratory syndrome coronavirus 2 (SARS-CoV-2). The disease is also known as coronavirus disease or novel coronavirus. In some people, the virus may not cause any symptoms. In others, it may cause a serious infection. The infection can get worse quickly and can lead to complications, such as:  Pneumonia, or infection of the lungs.  Acute respiratory distress syndrome or ARDS. This is a condition in which fluid build-up in the lungs prevents the lungs from filling with air and passing oxygen into the blood.  Acute respiratory failure. This is a condition in which there is not enough oxygen passing from the lungs to the body or when carbon dioxide is not passing from the lungs out of the body.  Sepsis or septic shock. This is a serious bodily reaction to an infection.  Blood clotting problems.  Secondary infections due to bacteria or fungus.  Organ failure. This is when your body's organs stop working. The virus that causes COVID-19 is contagious. This means that it can spread from person to person through droplets from coughs and sneezes (respiratory secretions). What are the causes? This illness is caused by a virus. You may catch the virus by:  Breathing in droplets from an infected person. Droplets can be spread by a person breathing, speaking, singing, coughing, or sneezing.  Touching something, like a table or a doorknob, that was exposed to the virus (contaminated) and then touching your mouth, nose, or eyes. What increases the risk? Risk for infection You are more likely to be infected with this virus if you:  Are within 6 feet (2 meters) of a person with COVID-19.  Provide care for or live with a person who is infected with COVID-19.  Spend time in crowded indoor spaces or live in shared housing. Risk for serious illness You are more likely to become seriously ill from the virus if  you:  Are 50 years of age or older. The higher your age, the more you are at risk for serious illness.  Live in a nursing home or long-term care facility.  Have cancer.  Have a long-term (chronic) disease such as: ? Chronic lung disease, including chronic obstructive pulmonary disease or asthma. ? A long-term disease that lowers your body's ability to fight infection (immunocompromised). ? Heart disease, including heart failure, a condition in which the arteries that lead to the heart become narrow or blocked (coronary artery disease), a disease which makes the heart muscle thick, weak, or stiff (cardiomyopathy). ? Diabetes. ? Chronic kidney disease. ? Sickle cell disease, a condition in which red blood cells have an abnormal "sickle" shape. ? Liver disease.  Are obese. What are the signs or symptoms? Symptoms of this condition can range from mild to severe. Symptoms may appear any time from 2 to 14 days after being exposed to the virus. They include:  A fever or chills.  A cough.  Difficulty breathing.  Headaches, body aches, or muscle aches.  Runny or stuffy (congested) nose.  A sore throat.  New loss of taste or smell. Some people may also have stomach problems, such as nausea, vomiting, or diarrhea. Other people may not have any symptoms of COVID-19. How is this diagnosed? This condition may be diagnosed based on:  Your signs and symptoms, especially if: ? You live in an area with a COVID-19 outbreak. ? You recently traveled to or from an area where the virus is common. ? You   provide care for or live with a person who was diagnosed with COVID-19. ? You were exposed to a person who was diagnosed with COVID-19.  A physical exam.  Lab tests, which may include: ? Taking a sample of fluid from the back of your nose and throat (nasopharyngeal fluid), your nose, or your throat using a swab. ? A sample of mucus from your lungs (sputum). ? Blood tests.  Imaging tests,  which may include, X-rays, CT scan, or ultrasound. How is this treated? At present, there is no medicine to treat COVID-19. Medicines that treat other diseases are being used on a trial basis to see if they are effective against COVID-19. Your health care provider will talk with you about ways to treat your symptoms. For most people, the infection is mild and can be managed at home with rest, fluids, and over-the-counter medicines. Treatment for a serious infection usually takes places in a hospital intensive care unit (ICU). It may include one or more of the following treatments. These treatments are given until your symptoms improve.  Receiving fluids and medicines through an IV.  Supplemental oxygen. Extra oxygen is given through a tube in the nose, a face mask, or a hood.  Positioning you to lie on your stomach (prone position). This makes it easier for oxygen to get into the lungs.  Continuous positive airway pressure (CPAP) or bi-level positive airway pressure (BPAP) machine. This treatment uses mild air pressure to keep the airways open. A tube that is connected to a motor delivers oxygen to the body.  Ventilator. This treatment moves air into and out of the lungs by using a tube that is placed in your windpipe.  Tracheostomy. This is a procedure to create a hole in the neck so that a breathing tube can be inserted.  Extracorporeal membrane oxygenation (ECMO). This procedure gives the lungs a chance to recover by taking over the functions of the heart and lungs. It supplies oxygen to the body and removes carbon dioxide. Follow these instructions at home: Lifestyle  If you are sick, stay home except to get medical care. Your health care provider will tell you how long to stay home. Call your health care provider before you go for medical care.  Rest at home as told by your health care provider.  Do not use any products that contain nicotine or tobacco, such as cigarettes,  e-cigarettes, and chewing tobacco. If you need help quitting, ask your health care provider.  Return to your normal activities as told by your health care provider. Ask your health care provider what activities are safe for you. General instructions  Take over-the-counter and prescription medicines only as told by your health care provider.  Drink enough fluid to keep your urine pale yellow.  Keep all follow-up visits as told by your health care provider. This is important. How is this prevented?  There is no vaccine to help prevent COVID-19 infection. However, there are steps you can take to protect yourself and others from this virus. To protect yourself:   Do not travel to areas where COVID-19 is a risk. The areas where COVID-19 is reported change often. To identify high-risk areas and travel restrictions, check the CDC travel website: wwwnc.cdc.gov/travel/notices  If you live in, or must travel to, an area where COVID-19 is a risk, take precautions to avoid infection. ? Stay away from people who are sick. ? Wash your hands often with soap and water for 20 seconds. If soap and water   are not available, use an alcohol-based hand sanitizer. ? Avoid touching your mouth, face, eyes, or nose. ? Avoid going out in public, follow guidance from your state and local health authorities. ? If you must go out in public, wear a cloth face covering or face mask. Make sure your mask covers your nose and mouth. ? Avoid crowded indoor spaces. Stay at least 6 feet (2 meters) away from others. ? Disinfect objects and surfaces that are frequently touched every day. This may include:  Counters and tables.  Doorknobs and light switches.  Sinks and faucets.  Electronics, such as phones, remote controls, keyboards, computers, and tablets. To protect others: If you have symptoms of COVID-19, take steps to prevent the virus from spreading to others.  If you think you have a COVID-19 infection, contact  your health care provider right away. Tell your health care team that you think you may have a COVID-19 infection.  Stay home. Leave your house only to seek medical care. Do not use public transport.  Do not travel while you are sick.  Wash your hands often with soap and water for 20 seconds. If soap and water are not available, use alcohol-based hand sanitizer.  Stay away from other members of your household. Let healthy household members care for children and pets, if possible. If you have to care for children or pets, wash your hands often and wear a mask. If possible, stay in your own room, separate from others. Use a different bathroom.  Make sure that all people in your household wash their hands well and often.  Cough or sneeze into a tissue or your sleeve or elbow. Do not cough or sneeze into your hand or into the air.  Wear a cloth face covering or face mask. Make sure your mask covers your nose and mouth. Where to find more information  Centers for Disease Control and Prevention: www.cdc.gov/coronavirus/2019-ncov/index.html  World Health Organization: www.who.int/health-topics/coronavirus Contact a health care provider if:  You live in or have traveled to an area where COVID-19 is a risk and you have symptoms of the infection.  You have had contact with someone who has COVID-19 and you have symptoms of the infection. Get help right away if:  You have trouble breathing.  You have pain or pressure in your chest.  You have confusion.  You have bluish lips and fingernails.  You have difficulty waking from sleep.  You have symptoms that get worse. These symptoms may represent a serious problem that is an emergency. Do not wait to see if the symptoms will go away. Get medical help right away. Call your local emergency services (911 in the [place] Do not drive yourself to the hospital. Let the emergency medical personnel know if you think you have  COVID-19. Summary  COVID-19 is a respiratory infection that is caused by a virus. It is also known as coronavirus disease or novel coronavirus. It can cause serious infections, such as pneumonia, acute respiratory distress syndrome, acute respiratory failure, or sepsis.  The virus that causes COVID-19 is contagious. This means that it can spread from person to person through droplets from breathing, speaking, singing, coughing, or sneezing.  You are more likely to develop a serious illness if you are 50 years of age or older, have a weak immune system, live in a nursing home, or have chronic disease.  There is no medicine to treat COVID-19. Your health care provider will talk with you about ways to treat your symptoms.    Take steps to protect yourself and others from infection. Wash your hands often and disinfect objects and surfaces that are frequently touched every day. Stay away from people who are sick and wear a mask if you are sick. This information is not intended to replace advice given to you by your health care provider. Make sure you discuss any questions you have with your health care provider. Document Revised: 07/16/2019 Document Reviewed: 10/22/2018 Elsevier Patient Education  2020 [employer] What types of side effects do monoclonal antibody drugs cause?  Common side effects  In general, the more common side effects caused by monoclonal antibody drugs include: . Allergic reactions, such as hives or itching . Flu-like signs and symptoms, including chills, fatigue, fever, and muscle aches and pains . Nausea, vomiting . Diarrhea . Skin rashes . Low blood pressure   The CDC is recommending patients who receive monoclonal antibody treatments wait at least 90 days before being vaccinated.  Currently, there are no data on the safety and efficacy of mRNA COVID-19 vaccines in persons who received monoclonal antibodies or convalescent plasma as part of COVID-19 treatment. Based  on the estimated half-life of such therapies as well as evidence suggesting that reinfection is uncommon in the 90 days after initial infection, vaccination should be deferred for at least 90 days, as a precautionary measure until additional information becomes available, to avoid interference of the antibody treatment with vaccine-induced immune responses. 

## 2020-06-27 NOTE — Progress Notes (Signed)
  Diagnosis: COVID-19  Physician:Dr. Wright  Procedure: [facility] Med: casirivimab\imdevimab infusion - Provided patient with casirivimab\imdevimab fact sheet for patients, parents and caregivers prior to infusion.  Complications: No immediate complications noted.  Discharge: Discharged home   Jason Guerrero 06/27/2020  

## 2020-07-10 ENCOUNTER — Other Ambulatory Visit: Payer: Self-pay | Admitting: Internal Medicine

## 2020-07-10 DIAGNOSIS — F419 Anxiety disorder, unspecified: Secondary | ICD-10-CM

## 2020-07-10 MED ORDER — ALPRAZOLAM 1 MG PO TABS: ORAL_TABLET | ORAL | 0 refills | Status: DC

## 2020-07-15 ENCOUNTER — Other Ambulatory Visit: Payer: Self-pay | Admitting: Internal Medicine

## 2020-07-15 DIAGNOSIS — K219 Gastro-esophageal reflux disease without esophagitis: Secondary | ICD-10-CM

## 2020-07-26 ENCOUNTER — Ambulatory Visit (INDEPENDENT_AMBULATORY_CARE_PROVIDER_SITE_OTHER): Payer: BC Managed Care – PPO | Admitting: Adult Health Nurse Practitioner

## 2020-07-26 ENCOUNTER — Other Ambulatory Visit: Payer: Self-pay

## 2020-07-26 ENCOUNTER — Encounter: Payer: Self-pay | Admitting: Adult Health Nurse Practitioner

## 2020-07-26 VITALS — BP 122/80 | HR 83 | Temp 98.6°F | Wt 189.0 lb

## 2020-07-26 DIAGNOSIS — E559 Vitamin D deficiency, unspecified: Secondary | ICD-10-CM | POA: Diagnosis not present

## 2020-07-26 DIAGNOSIS — I10 Essential (primary) hypertension: Secondary | ICD-10-CM

## 2020-07-26 DIAGNOSIS — Z79899 Other long term (current) drug therapy: Secondary | ICD-10-CM

## 2020-07-26 DIAGNOSIS — F10982 Alcohol use, unspecified with alcohol-induced sleep disorder: Secondary | ICD-10-CM

## 2020-07-26 DIAGNOSIS — R7309 Other abnormal glucose: Secondary | ICD-10-CM | POA: Diagnosis not present

## 2020-07-26 DIAGNOSIS — K219 Gastro-esophageal reflux disease without esophagitis: Secondary | ICD-10-CM

## 2020-07-26 DIAGNOSIS — R0602 Shortness of breath: Secondary | ICD-10-CM

## 2020-07-26 DIAGNOSIS — Z8616 Personal history of COVID-19: Secondary | ICD-10-CM

## 2020-07-26 DIAGNOSIS — E782 Mixed hyperlipidemia: Secondary | ICD-10-CM

## 2020-07-26 DIAGNOSIS — F419 Anxiety disorder, unspecified: Secondary | ICD-10-CM

## 2020-07-26 NOTE — Progress Notes (Signed)
Assessment and Plan:   Diagnoses and all orders for this visit:  Gastroesophageal reflux disease without esophagitis Doing well taking prilosec 40mg ; didn't do well with ranitidine, frequent breakthrough sx Monitor mag, ca+, vit D  Essential hypertension Continue medication: nadolol Monitor blood pressure at home; call if consistently over 130/80 Continue DASH diet.   Reminder to go to the ER if any CP, SOB, nausea, dizziness, severe HA, changes vision/speech, left arm numbness and tingling and jaw pain.  Mixed hyperlipidemia Taking rosuvastatin 20mg , omega 3 supplement Discussed dietary and exercise modifications  Vitamin D deficiency Continue supplementation; hasn't changed dose, last check at goal  Will check at CPE  Insomnia, unspecified type Anxiety Taking Alprazolam 1mg  half tab at this point, gabapentin, amitriptyline nightly Reviewed lifestyle at length today; advised reducing alcohol intake  Alcohol use with sleep disorder (HCC) Long hx of insomnia complicated by excess alcohol intake Reiterated excess alcohol intake in the evening prevents deep sleep and likely contributing to his symptoms Discussed appropriate daily intake should be limited to 1-2 drinks per day He expresses understanding and is receptive to cutting back; strategies discussed. Declines medications to assist.    Tobacco use disorder, History of Discussed risks associated with tobacco use and advised to reduce or quit Patient is ready to do so and plans to taper down and use OTC patches and lozenges Declines Chantix or other medication Will follow up at the next visit  Hx COVID-19 Exertional shortness of breath Order X-ray today Continue to monitor Discussed symptoms to call or return  Continue diet and meds as discussed. Further disposition pending results of labs. Discussed med's effects and SE's.   Over 30 minutes of -face to face interview, exam, counseling, chart review, and critical  decision making was performed.   Future Appointments  Date Time Provider Department Center  10/09/2020  9:00 AM , MD [facility] None    ----------------------------------------------------------------------------------------------------------------------  HPI 39 y.o. male  presents for 3 month follow up on HTN, HLD, insomnia, anxiety, weight, monitoring for prediabetes and vitamin D deficiency.   End of 05/2020 tested +COVID and had monoclonal antibodies infusion.  Reports that he remains shortness of breath and winded faster than normal.  It is main with exertion and in the evening.  He continues to have an inter=mittent cough that is hacky in nature.  Nothing makes it bettere, no known triggers and has not noticed anything making it worse. Some coughing instances cause pain in his lungs. Denies any wheezing, productive cough.  Reports he was a smoker, 21years, 1.5 a day.  Still uses nicotine patches to help with this.  Chest X-ray last 2017.    Son is 2, doing well. 69 year old daughter.   Quit smoking in November 2020, taking oral nicotine for now, slowly reducing.   He has chronic insomnia, now taking 1/2 tab of 1 mg xanax tablet to help with sleep along with gabapentin 300 mg and benadryl. Had severe dreams with trazodone. He is also prescribed amitriptyline 50 mg TID - takes 1 tab in AM, 2 tabs at night for chronic neck pain and helps some with sleep. He reports no difficulty falling asleep, has difficulty staying asleep. Admits to drinking 6 beers per night, has noted improvement in sleep quality with reduced alcohol in the past.   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 Has tried a carpal tunnel  brace without improvement. No known injury, denies pain in elbow, wrist. He dos have hx of pain though recently improved, typically muscular stiffness and has done some PT with ortho with improvement. He was evaluated by [facility] Dr. Yevette Edwards in 11/2015 for neck pain attributed to kyphosis, reported left forearm numbness/tingling at the time, cervical MRI from 10/2015 apparently showed minimal left sided neuroforaminal stenosis at C6-7, not felt to be likely contributing to his neuropathic sx. He was offered nerve conduction/EMG with specialist Dr. Janee Morn at the time but declined as sx were not concerning or bothersome to him at that time. He does agree sx with current episode are similar to that time, now interested in pursuing these studies.    He has GERD and taking omeprazole 40 mg daily with well controlled  Failed attempt to taper with ranitidine.    BMI is Body mass index is 26.74 kg/m., he has been working on diet and exercise.  No soda. Trying to eat better and more consistently. No intentional exercise but chasing kids around.  Wt Readings from Last 3 Encounters:  07/26/20 189 lb (85.7 kg)  04/19/20 184 lb 12.8 oz (83.8 kg)  01/06/20 181 lb 12.8 oz (82.5 kg)    HTN predates 2012 His blood pressure has been controlled at home, today their BP is BP: 122/80  He does workout. He denies any cardiac symptoms, chest pains, palpitations (controlled on nadolol), shortness of breath, dizziness or lower extremity edema.    He is on cholesterol medication and taking Fish Oil 300mg  daily and newly on rosuvastatin 20 mg daily, previously on pravastatin).His cholesterol is not at goal. The cholesterol last visit was:   Lab Results  Component Value Date   CHOL 170 04/19/2020   HDL 50 04/19/2020   LDLCALC 97 04/19/2020   TRIG 125 04/19/2020   CHOLHDL 3.4 04/19/2020    He  Has been working on diet and exercise for gluose management, and denies hyperglycemia, hypoglycemia , increased appetite,  nausea, paresthesia of the feet, polydipsia and polyuria. Last A1C in the office was:  Lab Results  Component Value Date   HGBA1C 5.1 04/19/2020   Patient is on Vitamin D supplement.  He increased las OV to 10,000IU daily. Lab Results  Component Value Date   VD25OH 50 04/19/2020      Current Medications:  Current Outpatient Medications on File Prior to Visit  Medication Sig   ALPRAZolam (XANAX) 1 MG tablet Take 1/2-1 tablet at Bedtime ONLY if needed for Sleep &  limit to 5 days /week to avoid Addiction & Dementia   amitriptyline (ELAVIL) 50 MG tablet Take 1 tablet 3 x /day for Chronic Neck Pain   Cholecalciferol (VITAMIN D-3) 5000 UNITS TABS Take 1 tablet by mouth daily.    gabapentin (NEURONTIN) 300 MG capsule Take 1 capsule  3 x /day as needed for Pain   Magnesium 250 MG TABS Take 1 tablet by mouth daily.    nadolol (CORGARD) 40 MG tablet Take 1 tablet Daily for BP   Omega-3 Fatty Acids (FISH OIL PO) Take 300 mg by mouth daily.   omeprazole (PRILOSEC) 40 MG capsule Take     1 capsule      Daily        to Prevent Heartburn & Indigestion  rosuvastatin (CRESTOR) 20 MG tablet Take 1 tablet Daily for Cholesterol   No current facility-administered medications on file prior to visit.    Allergies:  Allergies  Allergen Reactions   Sulfa Antibiotics    Trazodone And Nefazodone     Severe dreams     Medical History:  Past Medical History:  Diagnosis Date   Anxiety    HLD (hyperlipidemia)    Insomnia    Lumbago    Tension headache    Unspecified vitamin D deficiency     Review of Systems:  Review of Systems  Constitutional: Negative for chills and fever.  HENT: Negative for ear pain, hearing loss and tinnitus.   Eyes: Negative for blurred vision and double vision.  Respiratory: Positive for cough and shortness of breath. Negative for hemoptysis and sputum production.   Cardiovascular: Negative for chest pain, palpitations and orthopnea.  Gastrointestinal:  Negative for abdominal pain, heartburn, nausea and vomiting.  Musculoskeletal: Positive for neck pain. Negative for back pain, joint pain and myalgias.  Skin: Negative for rash.  Neurological: Negative for dizziness, tingling, tremors and headaches.  Endo/Heme/Allergies: Negative for environmental allergies. Does not bruise/bleed easily.  Psychiatric/Behavioral: Negative for depression, hallucinations, substance abuse and suicidal ideas. The patient has insomnia.     Physical Exam: BP 122/80    Pulse 83    Temp 98.6 F (37 C)    Wt 189 lb (85.7 kg)    SpO2 97%    BMI 26.74 kg/m  Wt Readings from Last 3 Encounters:  07/26/20 189 lb (85.7 kg)  04/19/20 184 lb 12.8 oz (83.8 kg)  01/06/20 181 lb 12.8 oz (82.5 kg)   General Appearance: Well nourished, in no apparent distress. Eyes: PERRLA, EOMs, conjunctiva no swelling or erythema Sinuses: No Frontal/maxillary tenderness ENT/Mouth: Ext aud canals clear, TMs without erythema, bulging. No erythema, swelling, or exudate on post pharynx.  Tonsils not swollen or erythematous. Hearing normal.  Neck: Supple, thyroid normal.  Respiratory: Respiratory effort normal, BS equal bilaterally without rales, rhonchi, wheezing or stridor.  Cardio: RRR with no MRGs. Brisk peripheral pulses without edema.  Abdomen: Soft, + BS.  Non tender, no guarding, rebound, hernias, masses. Lymphatics: Non tender without lymphadenopathy.  Musculoskeletal: Full ROM through neck, bil shoulder, elbows, wrists. No palpable abnormality. 5/5 strength symmetrical throughout, no notable atrophy of left extremity. Normal gait Skin: Warm, dry without rashes, lesions, ecchymosis.  Neuro: Cranial nerves intact. No cerebellar symptoms. Decreased sensation of left hand 4th/5th digits and lateral 3rd digit palmar and dorsal surfaces extending to wrist.  Psych: Awake and oriented X 3, normal affect, Insight and Judgment appropriate.     Elder Negus, NP [facility] 9:01 AM

## 2020-07-27 LAB — CBC WITH DIFFERENTIAL/PLATELET
Absolute Monocytes: 583 cells/uL (ref 200–950)
Basophils Absolute: 48 cells/uL (ref 0–200)
Basophils Relative: 0.9 %
Eosinophils Absolute: 297 cells/uL (ref 15–500)
Eosinophils Relative: 5.6 %
HCT: 40.3 % (ref 38.5–50.0)
Hemoglobin: 14.3 g/dL (ref 13.2–17.1)
Lymphs Abs: 2284 cells/uL (ref 850–3900)
MCH: 31.5 pg (ref 27.0–33.0)
MCHC: 35.5 g/dL (ref 32.0–36.0)
MCV: 88.8 fL (ref 80.0–100.0)
MPV: 9.7 fL (ref 7.5–12.5)
Monocytes Relative: 11 %
Neutro Abs: 2088 cells/uL (ref 1500–7800)
Neutrophils Relative %: 39.4 %
Platelets: 210 10*3/uL (ref 140–400)
RBC: 4.54 10*6/uL (ref 4.20–5.80)
RDW: 12.7 % (ref 11.0–15.0)
Total Lymphocyte: 43.1 %
WBC: 5.3 10*3/uL (ref 3.8–10.8)

## 2020-07-27 LAB — COMPLETE METABOLIC PANEL WITH GFR
AG Ratio: 1.6 (calc) (ref 1.0–2.5)
ALT: 25 U/L (ref 9–46)
AST: 21 U/L (ref 10–40)
Albumin: 4.1 g/dL (ref 3.6–5.1)
Alkaline phosphatase (APISO): 71 U/L (ref 36–130)
BUN: 7 mg/dL (ref 7–25)
CO2: 26 mmol/L (ref 20–32)
Calcium: 10 mg/dL (ref 8.6–10.3)
Chloride: 107 mmol/L (ref 98–110)
Creat: 0.89 mg/dL (ref 0.60–1.35)
GFR, Est African American: 125 mL/min/{1.73_m2} (ref >=60)
GFR, Est Non African American: 108 mL/min/{1.73_m2} (ref >=60)
Globulin: 2.6 g/dL (calc) (ref 1.9–3.7)
Glucose, Bld: 108 mg/dL — ABNORMAL HIGH (ref 65–99)
Potassium: 4.3 mmol/L (ref 3.5–5.3)
Sodium: 140 mmol/L (ref 135–146)
Total Bilirubin: 0.4 mg/dL (ref 0.2–1.2)
Total Protein: 6.7 g/dL (ref 6.1–8.1)

## 2020-07-27 LAB — HEMOGLOBIN A1C
Hgb A1c MFr Bld: 5.4 % of total Hgb (ref <5.7)
Mean Plasma Glucose: 108 (calc)
eAG (mmol/L): 6 (calc)

## 2020-07-27 LAB — LIPID PANEL
Cholesterol: 93 mg/dL (ref <200)
HDL: 38 mg/dL — ABNORMAL LOW (ref >=40)
LDL Cholesterol (Calc): 31 mg/dL (calc)
Non-HDL Cholesterol (Calc): 55 mg/dL (calc) (ref <130)
Total CHOL/HDL Ratio: 2.4 (calc) (ref <5.0)
Triglycerides: 153 mg/dL — ABNORMAL HIGH (ref <150)

## 2020-07-27 LAB — VITAMIN D 25 HYDROXY (VIT D DEFICIENCY, FRACTURES): Vit D, 25-Hydroxy: 93 ng/mL (ref 30–100)

## 2020-07-28 ENCOUNTER — Ambulatory Visit
Admission: RE | Admit: 2020-07-28 | Discharge: 2020-07-28 | Disposition: A | Discharge status: Home / Self Care | Payer: BC Managed Care – PPO | Source: Ambulatory Visit | Attending: Adult Health Nurse Practitioner | Admitting: Adult Health Nurse Practitioner

## 2020-07-28 ENCOUNTER — Other Ambulatory Visit: Payer: Self-pay

## 2020-07-28 DIAGNOSIS — R0602 Shortness of breath: Secondary | ICD-10-CM | POA: Diagnosis not present

## 2020-08-05 ENCOUNTER — Other Ambulatory Visit: Payer: Self-pay | Admitting: Internal Medicine

## 2020-08-15 ENCOUNTER — Other Ambulatory Visit: Payer: Self-pay | Admitting: Internal Medicine

## 2020-08-23 ENCOUNTER — Other Ambulatory Visit: Payer: Self-pay | Admitting: Internal Medicine

## 2020-08-23 DIAGNOSIS — F419 Anxiety disorder, unspecified: Secondary | ICD-10-CM

## 2020-08-23 MED ORDER — ALPRAZOLAM 1 MG PO TABS: ORAL_TABLET | ORAL | 0 refills | Status: DC

## 2020-08-30 ENCOUNTER — Other Ambulatory Visit: Payer: Self-pay | Admitting: Internal Medicine

## 2020-10-02 ENCOUNTER — Other Ambulatory Visit: Payer: Self-pay | Admitting: Internal Medicine

## 2020-10-02 DIAGNOSIS — K219 Gastro-esophageal reflux disease without esophagitis: Secondary | ICD-10-CM

## 2020-10-02 DIAGNOSIS — F419 Anxiety disorder, unspecified: Secondary | ICD-10-CM

## 2020-10-02 MED ORDER — ALPRAZOLAM 1 MG PO TABS: ORAL_TABLET | ORAL | 0 refills | Status: DC

## 2020-10-09 ENCOUNTER — Encounter: Payer: BC Managed Care – PPO | Admitting: Internal Medicine

## 2020-10-09 IMAGING — CR DG HAND COMPLETE 3+V*R*
3 series · 3 of 3 positions shown · non-contrast
Comparison: None.

CLINICAL DATA: Swelling base of the pinky

EXAM:
RIGHT HAND - COMPLETE 3+ VIEW

[x hand pa right]
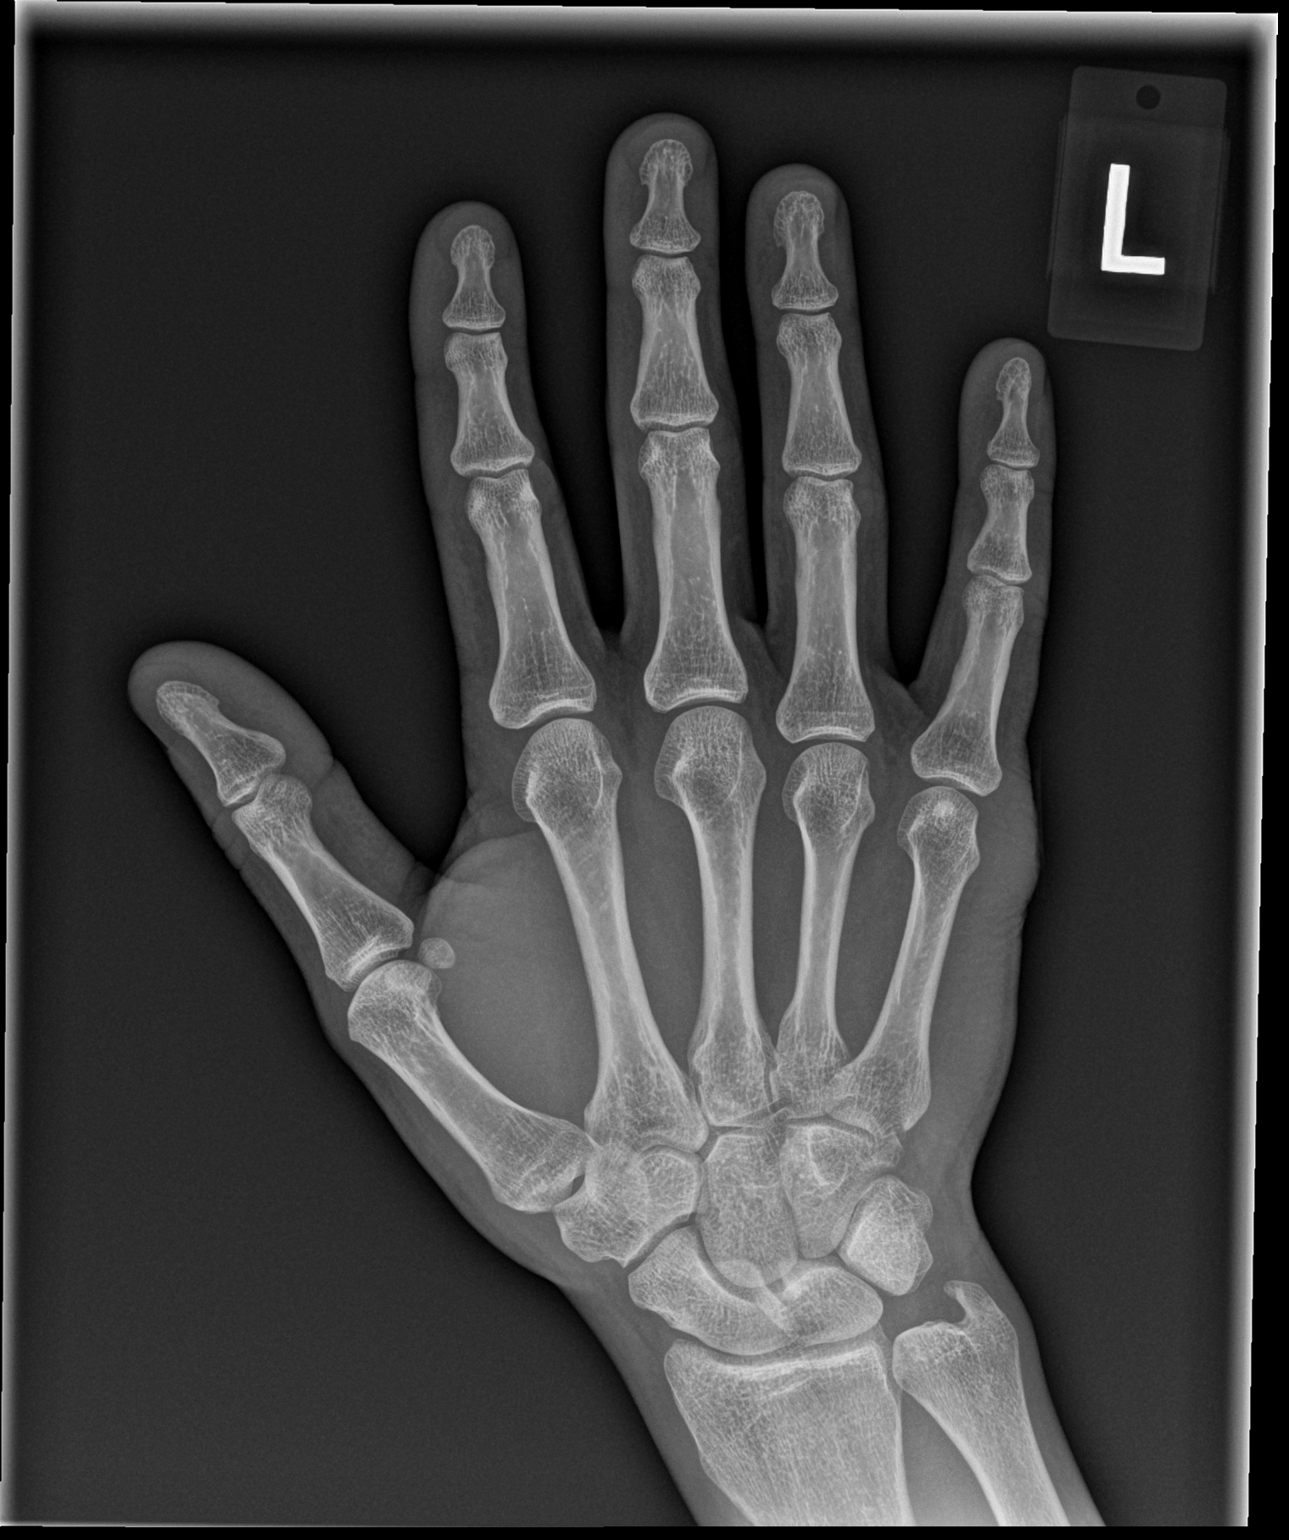

[x hand obl right]
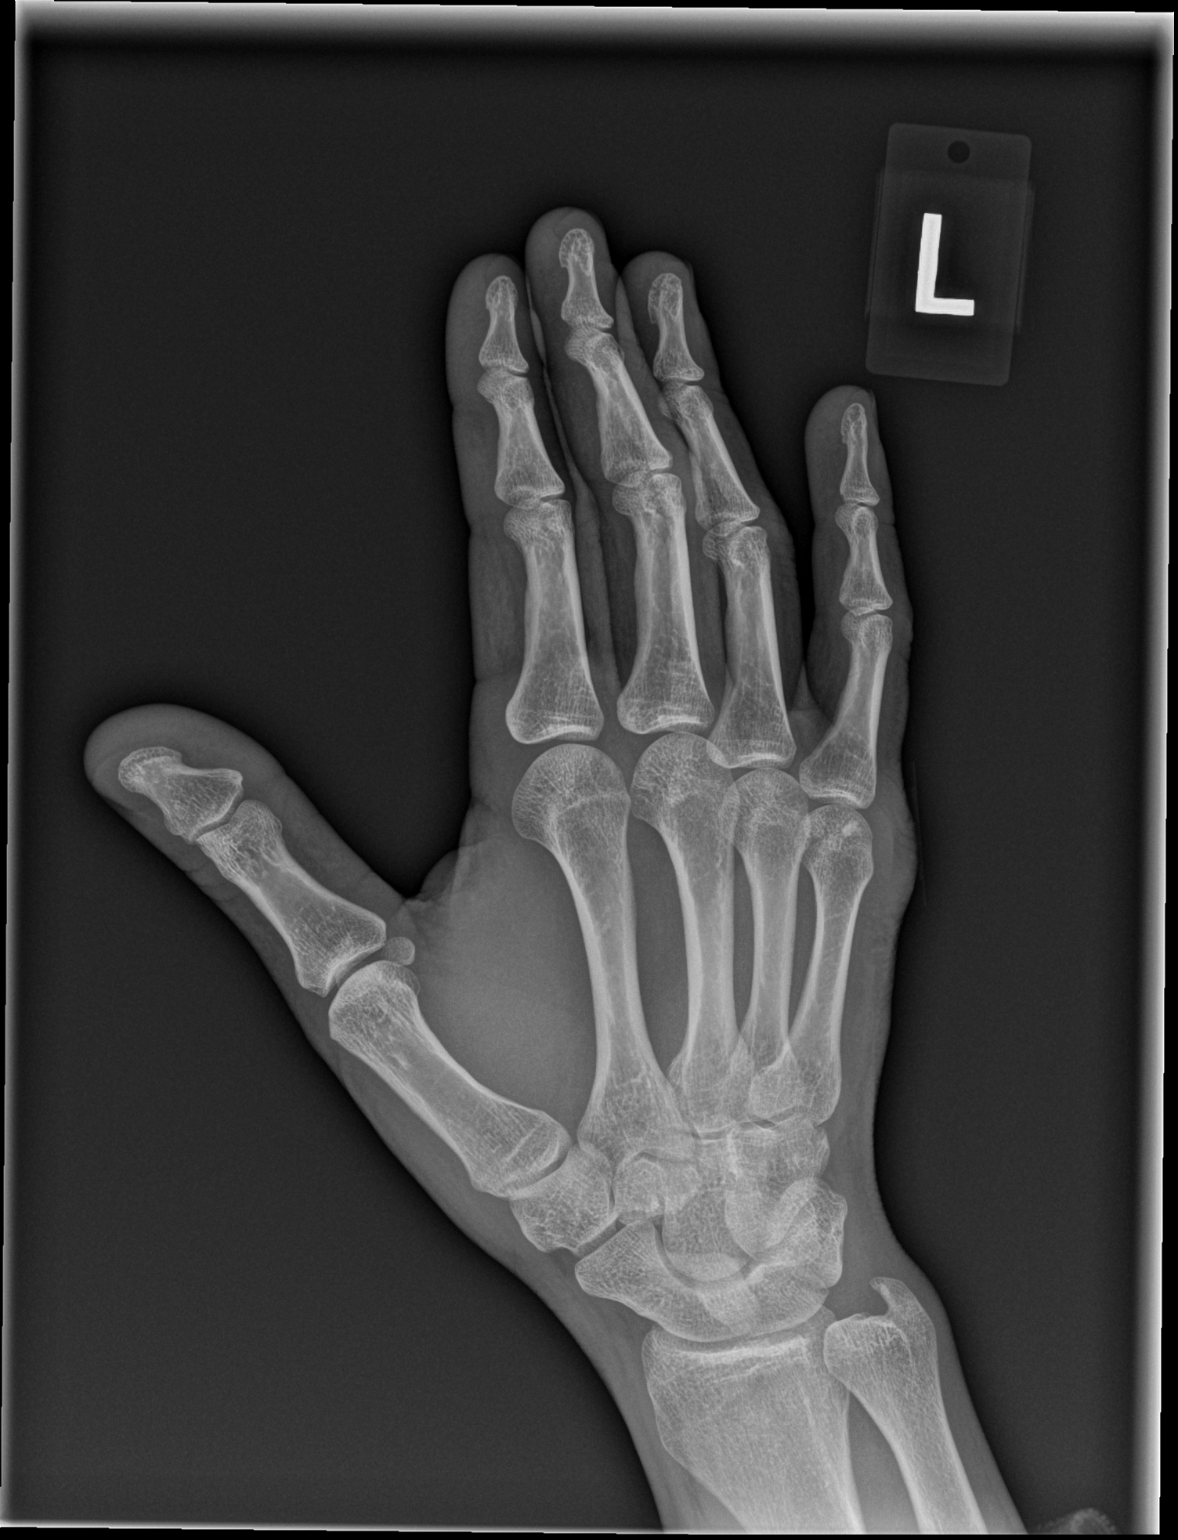

[x hand lat right]
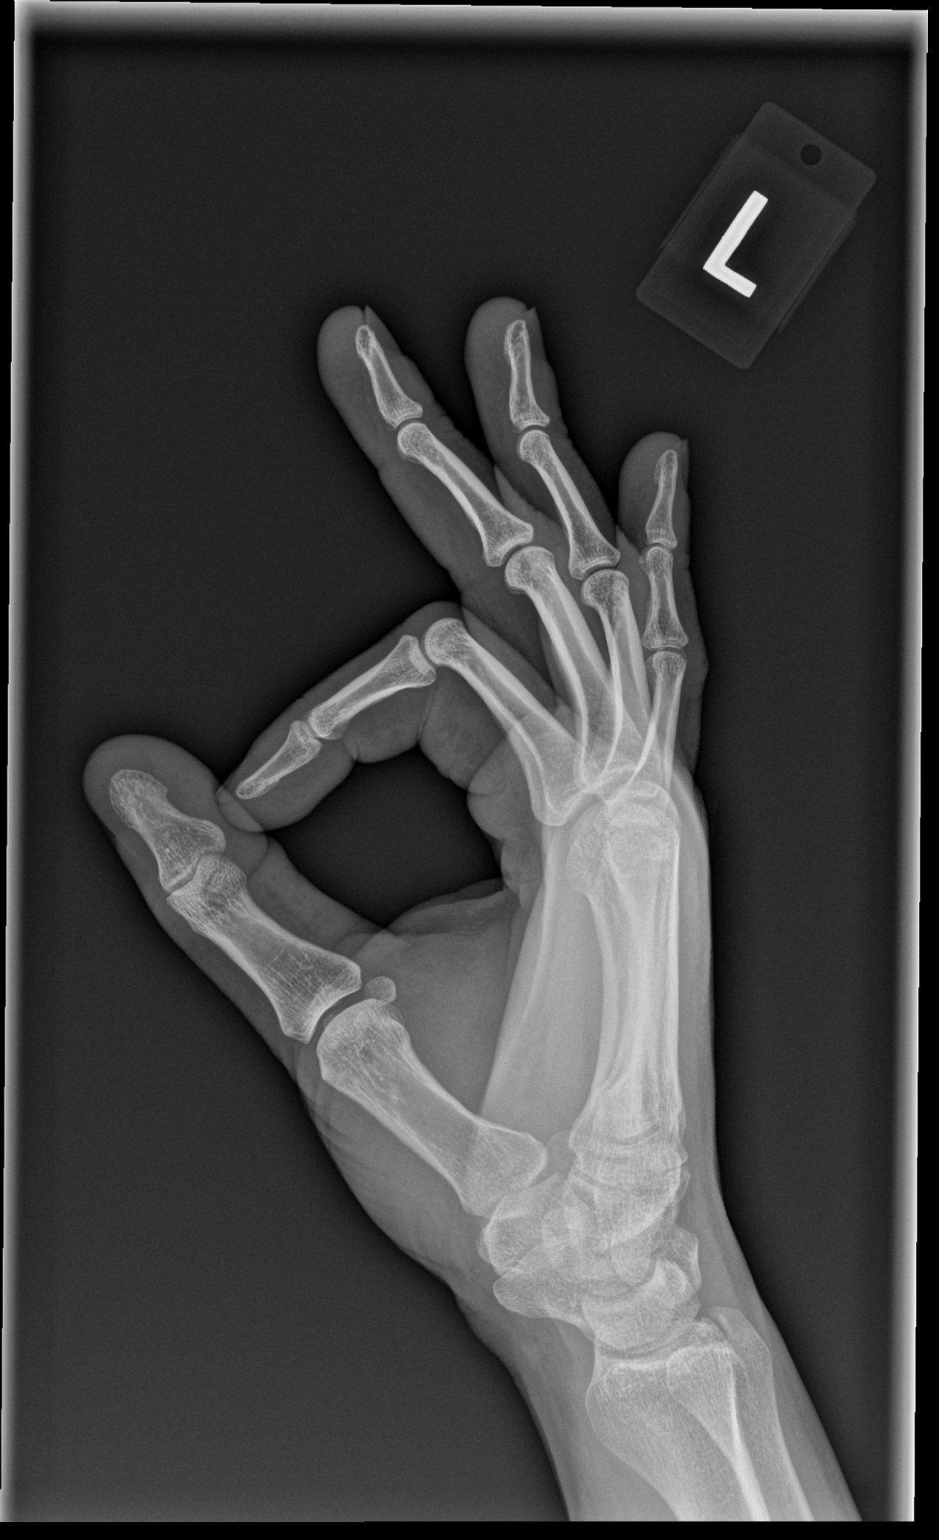

[3 of 3 positions shown; findings below may reference images not displayed]

FINDINGS: There is no evidence of fracture or dislocation. There is no
evidence of arthropathy or other focal bone abnormality. Soft
tissues are unremarkable.
IMPRESSION: Negative.

## 2020-10-22 ENCOUNTER — Other Ambulatory Visit: Payer: Self-pay | Admitting: Internal Medicine

## 2020-10-22 DIAGNOSIS — E782 Mixed hyperlipidemia: Secondary | ICD-10-CM

## 2020-11-02 ENCOUNTER — Other Ambulatory Visit: Payer: Self-pay | Admitting: Internal Medicine

## 2020-11-09 ENCOUNTER — Other Ambulatory Visit: Payer: Self-pay | Admitting: Internal Medicine

## 2020-11-09 DIAGNOSIS — F419 Anxiety disorder, unspecified: Secondary | ICD-10-CM

## 2020-11-09 MED ORDER — ALPRAZOLAM 1 MG PO TABS: ORAL_TABLET | ORAL | 0 refills | Status: DC

## 2020-12-06 ENCOUNTER — Encounter: Payer: Self-pay | Admitting: Internal Medicine

## 2020-12-06 NOTE — Progress Notes (Signed)
Annual  Screening/Preventative Visit  & Comprehensive Evaluation & Examination      This very nice 40 y.o.  single WM presents for a Screening /Preventative Visit & comprehensive evaluation and management of multiple medical co-morbidities.  Patient has been followed for HTN, HLD, Prediabetes and Vitamin D Deficiency. Patient has GERD controlled with diet & his meds.       Labile HTN predates circa 2012 with BP well controlled on Corgard.  Patient's BP has been controlled at home.  Today's BP is at goal - 132/84. Patient denies any cardiac symptoms as chest pain, palpitations, shortness of breath, dizziness or ankle swelling.      Patient's hyperlipidemia is controlled with diet and Rosuvastatin. Patient denies myalgias or other medication SE's. Last lipids were at goal:  Lab Results  Component Value Date   CHOL 93 07/26/2020   HDL 38 (L) 07/26/2020   LDLCALC 31 07/26/2020   TRIG 153 (H) 07/26/2020   CHOLHDL 2.4 07/26/2020       Patient is monitored for glucose intolerance and patient denies reactive hypoglycemic symptoms, visual blurring, diabetic polys or paresthesias. Last A1c was normal & at goal:   Lab Results  Component Value Date   HGBA1C 5.4 07/26/2020        Finally, patient has history of Vitamin D Deficiency ("28" /2012) and last vitamin D was at goal:  Lab Results  Component Value Date   VD25OH 93 07/26/2020    Current Outpatient Medications on File Prior to Visit  Medication Sig  . ALPRAZolam (XANAX) 1 MG tablet Take 1/2-1 tablet at Bedtime       . amitriptyline (ELAVIL) 50 MG tablet Take 1 tablet 3 x  /day  . VITAMIN D 5000 u  Take 1 tablet  daily.   Marland Kitchen gabapentin (NEURONTIN) 300 MG capsule Take 1 capsule  3 x /day as needed for Pain  . Magnesium 250 MG TABS Take 1 tablet daily.   . nadolol (CORGARD) 40 MG tablet Take  1 tablet  Daily for BP  . Omega-3 Fatty Acids (FISH OIL PO) Take 300 mg  daily.  Marland Kitchen omeprazole (PRILOSEC) 40 MG capsule Take 1 capsule  Daily       . rosuvastatin (CRESTOR) 20 MG tablet Take  1 tablet   Daily    for Cholesterol     Allergies  Allergen Reactions  . Sulfa Antibiotics   . Trazodone And Nefazodone     Severe dreams    Past Medical History:  Diagnosis Date  . Anxiety   . HLD (hyperlipidemia)   . Insomnia   . Lumbago   . Tension headache   . vitamin D deficiency     Health Maintenance  Topic Date Due  . COVID-19 Vaccine (1) Never done  . INFLUENZA VACCINE  04/30/2020  . TETANUS/TDAP  10/22/2023  . Hepatitis C Screening  Completed  . HIV Screening  Completed  . HPV VACCINES  Aged Out    Immunization History  Administered Date(s) Administered  . Influenza Inj Mdck Quad With Preservative 07/30/2017, 08/25/2018  . Influenza, Seasonal, Injecte, Preservative Fre 09/07/2015  . PPD Test 06/01/2014, 06/06/2015, 06/28/2016, 07/30/2017, 10/04/2019  . Tdap 10/21/2013    History reviewed. No pertinent surgical history.  Family History  Problem Relation Age of Onset  . Hypertension Mother   . Diabetes Mother   . Heart disease Father   . Diabetes Father   . Colon cancer Maternal Grandfather   . Lung cancer Paternal Grandfather   .  Breast cancer Maternal Grandmother   . Ovarian cancer Paternal Grandmother   . Hyperlipidemia Father     Social History   Socioeconomic History  . Marital status: Single  Occupational History  . \  Tobacco Use  . Smoking status: Former Smoker    Packs/day: 1.00    Years: 21.00    Pack years: 21.00    Types: Cigarettes    Start date: 09/10/1998    Quit date: 08/04/2019    Years since quitting: 1.3  . Smokeless tobacco: Never Used  Substance and Sexual Activity  . Alcohol use: Yes    Alcohol/week: 0.0 standard drinks    Comment: 6 beers per night  . Drug use: No  . Sexual activity: Not on file   ROS Constitutional: Denies fever, chills, weight loss/gain, headaches, insomnia,  night sweats or change in appetite. Does c/o fatigue. Eyes: Denies redness,  blurred vision, diplopia, discharge, itchy or watery eyes.  ENT: Denies discharge, congestion, post nasal drip, epistaxis, sore throat, earache, hearing loss, dental pain, Tinnitus, Vertigo, Sinus pain or snoring.  Cardio: Denies chest pain, palpitations, irregular heartbeat, syncope, dyspnea, diaphoresis, orthopnea, PND, claudication or edema Respiratory: denies cough, dyspnea, DOE, pleurisy, hoarseness, laryngitis or wheezing.  Gastrointestinal: Denies dysphagia, heartburn, reflux, water brash, pain, cramps, nausea, vomiting, bloating, diarrhea, constipation, hematemesis, melena, hematochezia, jaundice or hemorrhoids Genitourinary: Denies dysuria, frequency, urgency, nocturia, hesitancy, discharge, hematuria or flank pain Musculoskeletal: Denies arthralgia, myalgia, stiffness, Jt. Swelling, pain, limp or strain/sprain. Denies Falls. Skin: Denies puritis, rash, hives, warts, acne, eczema or change in skin lesion Neuro: No weakness, tremor, incoordination, spasms, paresthesia or pain Psychiatric: Denies confusion, memory loss or sensory loss. Denies Depression. Endocrine: Denies change in weight, skin, hair change, nocturia, and paresthesia, diabetic polys, visual blurring or hyper / hypo glycemic episodes.  Heme/Lymph: No excessive bleeding, bruising or enlarged lymph nodes.  Physical Exam  BP 132/84   Pulse 81   Temp 97.7 F (36.5 C)   Resp 16   Ht 5' 10.5" (1.791 m)   Wt 197 lb (89.4 kg)   SpO2 99%   BMI 27.87 kg/m   General Appearance: Well nourished and well groomed and in no apparent distress.  Eyes: PERRLA, EOMs, conjunctiva no swelling or erythema, normal fundi and vessels. Sinuses: No frontal/maxillary tenderness ENT/Mouth: EACs patent / TMs  nl. Nares clear without erythema, swelling, mucoid exudates. Oral hygiene is good. No erythema, swelling, or exudate. Tongue normal, non-obstructing. Tonsils not swollen or erythematous. Hearing normal.  Neck: Supple, thyroid not  palpable. No bruits, nodes or JVD. Respiratory: Respiratory effort normal.  BS equal and clear bilateral without rales, rhonci, wheezing or stridor. Cardio: Heart sounds are normal with regular rate and rhythm and no murmurs, rubs or gallops. Peripheral pulses are normal and equal bilaterally without edema. No aortic or femoral bruits. Chest: symmetric with normal excursions and percussion.  Abdomen: Soft, with Nl bowel sounds. Nontender, no guarding, rebound, hernias, masses, or organomegaly.  Lymphatics: Non tender without lymphadenopathy.  Musculoskeletal: Full ROM all peripheral extremities, joint stability, 5/5 strength, and normal gait. Skin: Warm and dry without rashes, lesions, cyanosis, clubbing or  ecchymosis.  Neuro: Cranial nerves intact, reflexes equal bilaterally. Normal muscle tone, no cerebellar symptoms. Sensation intact.  Pysch: Alert and oriented X 3 with normal affect, insight and judgment appropriate.   Assessment and Plan  1. Annual Preventative/Screening Exam    2. Essential hypertension  - EKG 12-Lead - Urinalysis, Routine w reflex microscopic - Microalbumin / creatinine urine  ratio - CBC with Differential/Platelet - COMPLETE METABOLIC PANEL WITH GFR - Magnesium - TSH  3. Hyperlipidemia, mixed  - EKG 12-Lead - Lipid panel - TSH  4. Abnormal glucose  - EKG 12-Lead - Hemoglobin A1c - Insulin, random  5. Vitamin D deficiency  - VITAMIN D 25 Hydroxy   6. Gastroesophageal reflux disease without esophagitis  - CBC with Differential/Platelet  7. Screening for colorectal cancer  - POC Hemoccult Bld/Stl   8. Screening-pulmonary TB  - TB Skin Test  9. Screening for ischemic heart disease  - EKG 12-Lead  10. FHx: heart disease  - EKG 12-Lead  11. Former smoker  - EKG 12-Lead  12. Fatigue, unspecified type  - Iron,Total/Total Iron Binding Cap - Vitamin B12 - Testosterone  13. Medication management  - Urinalysis, Routine w reflex  microscopic - Microalbumin / creatinine urine ratio - CBC with Differential/Platelet - COMPLETE METABOLIC PANEL WITH GFR - Magnesium - Lipid panel - TSH - Hemoglobin A1c - Insulin, random - VITAMIN D 25 Hydroxy   14. Prostate cancer screen  - PSA          Patient was counseled in prudent diet, weight control to achieve/maintain BMI less than 25, BP monitoring, regular exercise and medications as discussed.  Discussed med effects and SE's. Routine screening labs and tests as requested with regular follow-up as recommended. Over 40 minutes of exam, counseling, chart review and high complex critical decision making was performed   Marinus Maw, MD

## 2020-12-06 NOTE — Patient Instructions (Signed)

## 2020-12-07 ENCOUNTER — Other Ambulatory Visit: Payer: Self-pay

## 2020-12-07 ENCOUNTER — Ambulatory Visit (INDEPENDENT_AMBULATORY_CARE_PROVIDER_SITE_OTHER): Payer: BC Managed Care – PPO | Admitting: Internal Medicine

## 2020-12-07 VITALS — BP 132/84 | HR 81 | Temp 97.7°F | Resp 16 | Ht 70.5 in | Wt 197.0 lb

## 2020-12-07 DIAGNOSIS — N401 Enlarged prostate with lower urinary tract symptoms: Secondary | ICD-10-CM

## 2020-12-07 DIAGNOSIS — Z1329 Encounter for screening for other suspected endocrine disorder: Secondary | ICD-10-CM

## 2020-12-07 DIAGNOSIS — Z125 Encounter for screening for malignant neoplasm of prostate: Secondary | ICD-10-CM

## 2020-12-07 DIAGNOSIS — R5383 Other fatigue: Secondary | ICD-10-CM

## 2020-12-07 DIAGNOSIS — Z1389 Encounter for screening for other disorder: Secondary | ICD-10-CM | POA: Diagnosis not present

## 2020-12-07 DIAGNOSIS — Z1212 Encounter for screening for malignant neoplasm of rectum: Secondary | ICD-10-CM

## 2020-12-07 DIAGNOSIS — Z79899 Other long term (current) drug therapy: Secondary | ICD-10-CM | POA: Diagnosis not present

## 2020-12-07 DIAGNOSIS — Z136 Encounter for screening for cardiovascular disorders: Secondary | ICD-10-CM | POA: Diagnosis not present

## 2020-12-07 DIAGNOSIS — Z13220 Encounter for screening for lipoid disorders: Secondary | ICD-10-CM

## 2020-12-07 DIAGNOSIS — E559 Vitamin D deficiency, unspecified: Secondary | ICD-10-CM | POA: Diagnosis not present

## 2020-12-07 DIAGNOSIS — K219 Gastro-esophageal reflux disease without esophagitis: Secondary | ICD-10-CM

## 2020-12-07 DIAGNOSIS — Z130 Encounter for screening for diseases of the blood and blood-forming organs and certain disorders involving the immune mechanism: Secondary | ICD-10-CM

## 2020-12-07 DIAGNOSIS — R350 Frequency of micturition: Secondary | ICD-10-CM

## 2020-12-07 DIAGNOSIS — Z0001 Encounter for general adult medical examination with abnormal findings: Secondary | ICD-10-CM

## 2020-12-07 DIAGNOSIS — Z8249 Family history of ischemic heart disease and other diseases of the circulatory system: Secondary | ICD-10-CM

## 2020-12-07 DIAGNOSIS — Z131 Encounter for screening for diabetes mellitus: Secondary | ICD-10-CM

## 2020-12-07 DIAGNOSIS — Z87891 Personal history of nicotine dependence: Secondary | ICD-10-CM | POA: Diagnosis not present

## 2020-12-07 DIAGNOSIS — Z0000 Encounter for general adult medical examination without abnormal findings: Secondary | ICD-10-CM | POA: Diagnosis not present

## 2020-12-07 DIAGNOSIS — E782 Mixed hyperlipidemia: Secondary | ICD-10-CM

## 2020-12-07 DIAGNOSIS — I10 Essential (primary) hypertension: Secondary | ICD-10-CM

## 2020-12-07 DIAGNOSIS — Z1211 Encounter for screening for malignant neoplasm of colon: Secondary | ICD-10-CM

## 2020-12-07 DIAGNOSIS — Z111 Encounter for screening for respiratory tuberculosis: Secondary | ICD-10-CM | POA: Diagnosis not present

## 2020-12-07 DIAGNOSIS — R7309 Other abnormal glucose: Secondary | ICD-10-CM

## 2020-12-08 ENCOUNTER — Encounter: Payer: Self-pay | Admitting: Internal Medicine

## 2020-12-08 LAB — LIPID PANEL
Cholesterol: 187 mg/dL (ref <200)
HDL: 56 mg/dL (ref >=40)
LDL Cholesterol (Calc): 95 mg/dL (calc)
Non-HDL Cholesterol (Calc): 131 mg/dL (calc) — ABNORMAL HIGH (ref <130)
Total CHOL/HDL Ratio: 3.3 (calc) (ref <5.0)
Triglycerides: 249 mg/dL — ABNORMAL HIGH (ref <150)

## 2020-12-08 LAB — VITAMIN D 25 HYDROXY (VIT D DEFICIENCY, FRACTURES): Vit D, 25-Hydroxy: 92 ng/mL (ref 30–100)

## 2020-12-08 LAB — MICROALBUMIN / CREATININE URINE RATIO
Creatinine, Urine: 53 mg/dL (ref 20–320)
Microalb Creat Ratio: 9 mcg/mg creat (ref <30)
Microalb, Ur: 0.5 mg/dL

## 2020-12-08 LAB — CBC WITH DIFFERENTIAL/PLATELET
Absolute Monocytes: 746 cells/uL (ref 200–950)
Basophils Absolute: 74 cells/uL (ref 0–200)
Basophils Relative: 0.9 %
Eosinophils Absolute: 410 cells/uL (ref 15–500)
Eosinophils Relative: 5 %
HCT: 46.7 % (ref 38.5–50.0)
Hemoglobin: 16.4 g/dL (ref 13.2–17.1)
Lymphs Abs: 2919 cells/uL (ref 850–3900)
MCH: 30.4 pg (ref 27.0–33.0)
MCHC: 35.1 g/dL (ref 32.0–36.0)
MCV: 86.6 fL (ref 80.0–100.0)
MPV: 9.8 fL (ref 7.5–12.5)
Monocytes Relative: 9.1 %
Neutro Abs: 4051 cells/uL (ref 1500–7800)
Neutrophils Relative %: 49.4 %
Platelets: 263 10*3/uL (ref 140–400)
RBC: 5.39 10*6/uL (ref 4.20–5.80)
RDW: 12.6 % (ref 11.0–15.0)
Total Lymphocyte: 35.6 %
WBC: 8.2 10*3/uL (ref 3.8–10.8)

## 2020-12-08 LAB — VITAMIN B12: Vitamin B-12: 455 pg/mL (ref 200–1100)

## 2020-12-08 LAB — COMPLETE METABOLIC PANEL WITH GFR
AG Ratio: 1.7 (calc) (ref 1.0–2.5)
ALT: 29 U/L (ref 9–46)
AST: 22 U/L (ref 10–40)
Albumin: 4.9 g/dL (ref 3.6–5.1)
Alkaline phosphatase (APISO): 80 U/L (ref 36–130)
BUN: 10 mg/dL (ref 7–25)
CO2: 26 mmol/L (ref 20–32)
Calcium: 10.4 mg/dL — ABNORMAL HIGH (ref 8.6–10.3)
Chloride: 103 mmol/L (ref 98–110)
Creat: 0.92 mg/dL (ref 0.60–1.35)
GFR, Est African American: 120 mL/min/{1.73_m2} (ref >=60)
GFR, Est Non African American: 104 mL/min/{1.73_m2} (ref >=60)
Globulin: 2.9 g/dL (calc) (ref 1.9–3.7)
Glucose, Bld: 97 mg/dL (ref 65–99)
Potassium: 4.5 mmol/L (ref 3.5–5.3)
Sodium: 140 mmol/L (ref 135–146)
Total Bilirubin: 0.5 mg/dL (ref 0.2–1.2)
Total Protein: 7.8 g/dL (ref 6.1–8.1)

## 2020-12-08 LAB — URINALYSIS, ROUTINE W REFLEX MICROSCOPIC
Bilirubin Urine: NEGATIVE
Glucose, UA: NEGATIVE
Hgb urine dipstick: NEGATIVE
Ketones, ur: NEGATIVE
Leukocytes,Ua: NEGATIVE
Nitrite: NEGATIVE
Protein, ur: NEGATIVE
Specific Gravity, Urine: 1.01 (ref 1.001–1.03)
pH: >=8.5 — AB (ref 5.0–8.0)

## 2020-12-08 LAB — IRON, TOTAL/TOTAL IRON BINDING CAP
%SAT: 34 % (calc) (ref 20–48)
Iron: 108 ug/dL (ref 50–180)
TIBC: 316 mcg/dL (calc) (ref 250–425)

## 2020-12-08 LAB — TSH: TSH: 2.63 mIU/L (ref 0.40–4.50)

## 2020-12-08 LAB — TESTOSTERONE: Testosterone: 453 ng/dL (ref 250–827)

## 2020-12-08 LAB — HEMOGLOBIN A1C
Hgb A1c MFr Bld: 5.3 % of total Hgb (ref <5.7)
Mean Plasma Glucose: 105 mg/dL
eAG (mmol/L): 5.8 mmol/L

## 2020-12-08 LAB — MAGNESIUM: Magnesium: 2.4 mg/dL (ref 1.5–2.5)

## 2020-12-08 LAB — INSULIN, RANDOM: Insulin: 8 u[IU]/mL

## 2020-12-08 LAB — PSA: PSA: 0.32 ng/mL (ref <=4.0)

## 2020-12-08 NOTE — Progress Notes (Signed)
============================================================ -   Test results slightly outside the reference range are not unusual. If there is anything important, I will review this with you,  otherwise it is considered normal test values.  If you have further questions,  please do not hesitate to contact me at the office or via My Chart.  ============================================================ ============================================================  -  Iron & Vitamin B12 levels - Both Normal& OK  ============================================================ ============================================================  -  PSA - very low - Great  !     ============================================================ ============================================================  -  Testosterone Normal  ============================================================ ============================================================  -  Total Chol = 187   and    LDL Chol = 95   -   Both  Excellent   - Very low risk for Heart Attack  / Stroke ============================================================ ============================================================  -  But Triglycerides (   249    ) or fats in blood are too high  (goal is less than 150)    - Recommend avoid fried & greasy foods,  sweets / candy,   - Avoid white rice  (brown or wild rice or Quinoa is OK),   - Avoid white potatoes  (sweet potatoes are OK)   - Avoid anything made from white flour  - bagels, doughnuts, rolls, buns, biscuits, white and   wheat breads, pizza crust and traditional  pasta made of white flour & egg white  - (vegetarian pasta or spinach or wheat pasta is OK).    - Multi-grain bread is OK - like multi-grain flat bread or  sandwich thins.   - Avoid alcohol in excess.   - Exercise is also  important. ============================================================ ============================================================  -  A1c - Normal - Great - No Diabetes  ! ============================================================ ============================================================  -  Vitamin D = 92 - Excellent  ============================================================ ============================================================  -  All Else - CBC - Kidneys - Electrolytes - Liver - Magnesium & Thyroid    - all  Normal / OK ============================================================ ============================================================

## 2020-12-21 ENCOUNTER — Other Ambulatory Visit: Payer: Self-pay | Admitting: Internal Medicine

## 2020-12-21 DIAGNOSIS — F419 Anxiety disorder, unspecified: Secondary | ICD-10-CM

## 2020-12-21 MED ORDER — ALPRAZOLAM 1 MG PO TABS: ORAL_TABLET | ORAL | 0 refills | Status: DC

## 2020-12-29 ENCOUNTER — Other Ambulatory Visit: Payer: Self-pay | Admitting: Internal Medicine

## 2020-12-29 DIAGNOSIS — K219 Gastro-esophageal reflux disease without esophagitis: Secondary | ICD-10-CM

## 2021-01-18 ENCOUNTER — Other Ambulatory Visit: Payer: Self-pay | Admitting: Internal Medicine

## 2021-01-18 DIAGNOSIS — E782 Mixed hyperlipidemia: Secondary | ICD-10-CM

## 2021-01-24 ENCOUNTER — Other Ambulatory Visit: Payer: Self-pay | Admitting: Internal Medicine

## 2021-01-29 ENCOUNTER — Other Ambulatory Visit: Payer: Self-pay | Admitting: Internal Medicine

## 2021-01-29 DIAGNOSIS — F419 Anxiety disorder, unspecified: Secondary | ICD-10-CM

## 2021-01-29 MED ORDER — ALPRAZOLAM 1 MG PO TABS: ORAL_TABLET | ORAL | 0 refills | Status: DC

## 2021-02-01 ENCOUNTER — Other Ambulatory Visit: Payer: Self-pay | Admitting: Internal Medicine

## 2021-03-09 ENCOUNTER — Other Ambulatory Visit: Payer: Self-pay | Admitting: Internal Medicine

## 2021-03-09 DIAGNOSIS — F419 Anxiety disorder, unspecified: Secondary | ICD-10-CM

## 2021-03-09 MED ORDER — ALPRAZOLAM 1 MG PO TABS: ORAL_TABLET | ORAL | 0 refills | Status: DC

## 2021-04-23 ENCOUNTER — Other Ambulatory Visit: Payer: Self-pay | Admitting: Internal Medicine

## 2021-04-23 DIAGNOSIS — F419 Anxiety disorder, unspecified: Secondary | ICD-10-CM

## 2021-04-23 MED ORDER — ALPRAZOLAM 1 MG PO TABS
ORAL_TABLET | ORAL | 0 refills | Status: DC
Start: 2021-04-23 — End: 2021-06-02

## 2021-06-02 ENCOUNTER — Other Ambulatory Visit: Payer: Self-pay | Admitting: Internal Medicine

## 2021-06-02 DIAGNOSIS — F419 Anxiety disorder, unspecified: Secondary | ICD-10-CM

## 2021-06-02 MED ORDER — ALPRAZOLAM 1 MG PO TABS: ORAL_TABLET | ORAL | 0 refills | Status: DC

## 2021-06-25 ENCOUNTER — Other Ambulatory Visit: Payer: Self-pay | Admitting: Adult Health

## 2021-06-25 DIAGNOSIS — K219 Gastro-esophageal reflux disease without esophagitis: Secondary | ICD-10-CM

## 2021-07-04 NOTE — Progress Notes (Signed)
Assessment and Plan:   Andriy was seen today for follow-up.  Diagnoses and all orders for this visit:  Essential hypertension -     CBC with Differential/Platelet -     Microalbumin / creatinine urine ratio -     Urinalysis, Routine w reflex microscopic -     hydrochlorothiazide (HYDRODIURIL) 25 MG tablet; Take 1 tablet (25 mg total) by mouth daily. -  Will check BP daily and  if BP greater than 130/80 he will take HCTZ.  If BP is not controlled with HCTZ will follow up and change medication - Go to the ER if any chest pain, shortness of breath, nausea, dizziness, severe HA, changes vision/speech  Hyperlipidemia, mixed -     COMPLETE METABOLIC PANEL WITH GFR -     TSH -     Lipid panel - Continue Crestor, diet and exercise  Abnormal glucose -     Hemoglobin A1c - Continue diet and exercise  Anxiety/Insomnia -     escitalopram (LEXAPRO) 10 MG tablet; Take 1 tablet (10 mg total) by mouth daily. - Continue Xanax 1mg  1/2 tab at HS -    Monitor symptoms, if worsen Instructed patient to contact office or on-call physician promptly should condition worsen or any new symptoms appear. IF THE PATIENT HAS ANY SUICIDAL OR HOMICIDAL IDEATIONS, CALL THE OFFICE, DISCUSS WITH A SUPPORT MEMBER, OR GO TO THE ER IMMEDIATELY.   Vitamin D deficiency -     VITAMIN D 25 Hydroxy (Vit-D Deficiency, Fractures) - Continue Vit D supplementation  Tobacco use disorder       - Pt is using tobacco pouches 20 every 1-2 days, Counseled on dangers of smokeless tobacco  Gastroesophageal reflux disease without esophagitis -     Magnesium - Continue Prilosec daily - failed attempt to wean and dietary modifications  Medication management -     CBC with Differential/Platelet -     COMPLETE METABOLIC PANEL WITH GFR -     Magnesium -     VITAMIN D 25 Hydroxy (Vit-D Deficiency, Fractures) -     TSH -     Lipid panel -     Hemoglobin A1c -     Microalbumin / creatinine urine ratio -     Urinalysis, Routine w  reflex microscopic    Continue diet and meds as discussed. Further disposition pending results of labs. Discussed med's effects and SE's.   Over 30 minutes of -face to face interview, exam, counseling, chart review, and critical decision making was performed.   Future Appointments  Date Time Provider Department Center  12/17/2021 11:00 AM 12/19/2021, MD [facility] None    ----------------------------------------------------------------------------------------------------------------------  HPI 40 y.o. male  presents for 3 month follow up on HTN, HLD, insomnia, anxiety, weight, monitoring for prediabetes and vitamin D deficiency.   Pt has had an extreme amount of stress since beginning of September. June 2022 Father had leaky valve with surgery 06/07/21 Damage to lungs was discovered on ventilator x 2 weeks. 06/22/21 fiance and he broke up, 07-15-21 his father passed away from respiratory failure.  Has 2 kids 2 and 5 . Splitting time with his ex fiancee for son who is 2- started dating his fiance when her daughter was 1 and she calls him Dad but she is not allowing him to see the little girl.   Reports he was a smoker, 21years, 1.5 a day.  Chest X-ray last 2017. Quit 2 years ago . Uses nicotine pouches 20 every  1-2 days.  2 weeks ago went for dental cleaning and found place on tongue. Is seeing oral surgery for evaluation.   He has chronic insomnia, now taking 1/2 tab of 1 mg xanax tablet to help with sleep along with gabapentin 300 mg and benadryl. Had severe dreams with trazodone. He is also prescribed amitriptyline 50 mg TID - takes 1 tab in AM, 2 tabs at night for chronic neck pain and helps some with sleep. He reports no difficulty falling asleep, has difficulty staying asleep. Admits to drinking 6-7 beers per night recently with increased stress .  Had been falling asleep with tobacco pouch in mouth, has stopped doing that and sleep has improved.  He has history of neck pain , improved  with sleep number bed.  He has GERD and taking omeprazole 40 mg daily with well controlled  Failed attempt to taper with ranitidine.    BMI is Body mass index is 25.29 kg/m., he has been working on diet and exercise.  No soda. Trying to eat better and more consistently. No intentional exercise but chasing kids around.  Wt Readings from Last 3 Encounters:  07/05/21 178 lb 12.8 oz (81.1 kg)  12/07/20 197 lb (89.4 kg)  07/26/20 189 lb (85.7 kg)   HTN predates 2012 His blood pressure have been elevated recently at home last 3 readings 155/107, 130/93, 138/96, today their BP is BP: 132/90 BP Readings from Last 3 Encounters:  07/05/21 132/90  12/07/20 132/84  07/26/20 122/80     He does workout. He denies any cardiac symptoms, chest pains, palpitations (controlled on nadolol), shortness of breath, dizziness or lower extremity edema.    He is on cholesterol medication and taking Fish Oil 300mg  daily and newly on rosuvastatin 20 mg daily, previously on pravastatin).His cholesterol is not at goal. The cholesterol last visit was:   Lab Results  Component Value Date   CHOL 187 12/07/2020   HDL 56 12/07/2020   LDLCALC 95 12/07/2020   TRIG 249 (H) 12/07/2020   CHOLHDL 3.3 12/07/2020    He  Has been working on diet and exercise for gluose management, and denies hyperglycemia, hypoglycemia , increased appetite, nausea, paresthesia of the feet, polydipsia and polyuria. Last A1C in the office was:  Lab Results  Component Value Date   HGBA1C 5.3 12/07/2020   Patient is on Vitamin D supplement.  He increased las OV to 10,000IU daily. Lab Results  Component Value Date   VD25OH 92 12/07/2020      Current Medications:  Current Outpatient Medications on File Prior to Visit  Medication Sig   ALPRAZolam (XANAX) 1 MG tablet Take  1/2-1 tablet  at Bedtime  ONLY  if needed for Sleep &  limit to 5 days /week to avoid Addiction & Dementia   amitriptyline (ELAVIL) 50 MG tablet Take  1 tablet  3 x /day   for Chronic Neck Pain   b complex vitamins capsule Take 1 capsule by mouth daily.   Cholecalciferol (VITAMIN D-3) 5000 UNITS TABS Take 2 tablets by mouth daily. Taking 10,000 units   gabapentin (NEURONTIN) 300 MG capsule Take 1 capsule  3 x /day as needed for Pain   Magnesium 250 MG TABS Take 1 tablet by mouth daily.    nadolol (CORGARD) 40 MG tablet Take 1 tablet by mouth once daily for blood pressure   Omega-3 Fatty Acids (FISH OIL PO) Take 300 mg by mouth daily.   omeprazole (PRILOSEC) 40 MG capsule TAKE 1 CAPSULE  BY MOUTH ONCE DAILY TO  PREVENT  HEARTBURN  AND  INDIGESTION   rosuvastatin (CRESTOR) 20 MG tablet TAKE 1 TABLET BY MOUTH ONCE DAILY FOR CHOLESTEROL   No current facility-administered medications on file prior to visit.    Allergies:  Allergies  Allergen Reactions   Sulfa Antibiotics    Trazodone And Nefazodone     Severe dreams     Medical History:  Past Medical History:  Diagnosis Date   Anxiety    HLD (hyperlipidemia)    Insomnia    Lumbago    Tension headache    Unspecified vitamin D deficiency     Review of Systems:  Review of Systems  Constitutional:  Negative for chills and fever.  HENT:  Negative for ear pain, hearing loss and tinnitus.   Eyes:  Negative for blurred vision and double vision.  Respiratory:  Positive for cough and shortness of breath. Negative for hemoptysis and sputum production.   Cardiovascular:  Negative for chest pain, palpitations and orthopnea.  Gastrointestinal:  Negative for abdominal pain, heartburn, nausea and vomiting.  Musculoskeletal:  Positive for neck pain. Negative for back pain, joint pain and myalgias.  Skin:  Negative for rash.  Neurological:  Negative for dizziness, tingling, tremors and headaches.  Endo/Heme/Allergies:  Negative for environmental allergies. Does not bruise/bleed easily.  Psychiatric/Behavioral:  Negative for depression, hallucinations, substance abuse and suicidal ideas. The patient has insomnia.     Physical Exam: BP 132/90   Pulse 80   Temp (!) 97.5 F (36.4 C)   Wt 178 lb 12.8 oz (81.1 kg)   SpO2 99%   BMI 25.29 kg/m  Wt Readings from Last 3 Encounters:  07/05/21 178 lb 12.8 oz (81.1 kg)  12/07/20 197 lb (89.4 kg)  07/26/20 189 lb (85.7 kg)   General Appearance: Well nourished, in no apparent distress. Eyes: PERRLA, EOMs, conjunctiva no swelling or erythema Sinuses: No Frontal/maxillary tenderness ENT/Mouth: Ext aud canals clear, TMs without erythema, bulging. No erythema, swelling, or exudate on post pharynx.  Tonsils not swollen or erythematous. Hearing normal.  Neck: Supple, thyroid normal.  Respiratory: Respiratory effort normal, BS equal bilaterally without rales, rhonchi, wheezing or stridor.  Cardio: RRR with no MRGs. Brisk peripheral pulses without edema.  Abdomen: Soft, + BS.  Non tender, no guarding, rebound, hernias, masses. Lymphatics: Non tender without lymphadenopathy.  Musculoskeletal: Full ROM through neck, bil shoulder, elbows, wrists. No palpable abnormality. 5/5 strength symmetrical throughout, no notable atrophy of left extremity. Normal gait Skin: Warm, dry without rashes, lesions, ecchymosis.  Neuro: Cranial nerves intact. No cerebellar symptoms. Decreased sensation of left hand 4th/5th digits and lateral 3rd digit palmar and dorsal surfaces extending to wrist.  Psych: Awake and oriented X 3, normal affect, Insight and Judgment appropriate.     Revonda Humphrey, NP [facility] 4:15 PM

## 2021-07-05 ENCOUNTER — Other Ambulatory Visit: Payer: Self-pay

## 2021-07-05 ENCOUNTER — Ambulatory Visit (INDEPENDENT_AMBULATORY_CARE_PROVIDER_SITE_OTHER): Payer: BC Managed Care – PPO | Admitting: Nurse Practitioner

## 2021-07-05 ENCOUNTER — Encounter: Payer: Self-pay | Admitting: Nurse Practitioner

## 2021-07-05 VITALS — BP 132/90 | HR 80 | Temp 97.5°F | Wt 178.8 lb

## 2021-07-05 DIAGNOSIS — F419 Anxiety disorder, unspecified: Secondary | ICD-10-CM

## 2021-07-05 DIAGNOSIS — R7309 Other abnormal glucose: Secondary | ICD-10-CM | POA: Diagnosis not present

## 2021-07-05 DIAGNOSIS — I10 Essential (primary) hypertension: Secondary | ICD-10-CM | POA: Diagnosis not present

## 2021-07-05 DIAGNOSIS — Z79899 Other long term (current) drug therapy: Secondary | ICD-10-CM | POA: Diagnosis not present

## 2021-07-05 DIAGNOSIS — F10982 Alcohol use, unspecified with alcohol-induced sleep disorder: Secondary | ICD-10-CM

## 2021-07-05 DIAGNOSIS — E782 Mixed hyperlipidemia: Secondary | ICD-10-CM | POA: Diagnosis not present

## 2021-07-05 DIAGNOSIS — E559 Vitamin D deficiency, unspecified: Secondary | ICD-10-CM

## 2021-07-05 DIAGNOSIS — K219 Gastro-esophageal reflux disease without esophagitis: Secondary | ICD-10-CM

## 2021-07-05 DIAGNOSIS — F17200 Nicotine dependence, unspecified, uncomplicated: Secondary | ICD-10-CM

## 2021-07-05 DIAGNOSIS — G4700 Insomnia, unspecified: Secondary | ICD-10-CM

## 2021-07-05 MED ORDER — ESCITALOPRAM OXALATE 10 MG PO TABS: 10.0000 mg | ORAL_TABLET | Freq: Every day | ORAL | 2 refills | Status: DC

## 2021-07-05 MED ORDER — HYDROCHLOROTHIAZIDE 25 MG PO TABS: 25.0000 mg | ORAL_TABLET | Freq: Every day | ORAL | 3 refills | Status: DC

## 2021-07-05 NOTE — Patient Instructions (Addendum)
Check BP daily if greater than 130/80 take HCTZ.  If in 2 weeks BP is not controlled with both meds schedule appointment otherwise will follow up in 1 month   Hydrochlorothiazide Capsules or Tablets What is this medication? HYDROCHLOROTHIAZIDE (hye droe klor oh THYE a zide) treats high blood pressure. It may also be used to reduce swelling related to heart, kidney, or liver disease. It helps your kidneys remove more fluid and salt from your blood through the urine. It belongs to a group of medications called diuretics. This medicine may be used for other purposes; ask your health care provider or pharmacist if you have questions. COMMON BRAND NAME(S): Esidrix, Ezide, HydroDIURIL, Microzide, Oretic, Zide What should I tell my care team before I take this medication? They need to know if you have any of these conditions: Diabetes Gout Kidney disease Liver disease Lupus Pancreatitis An unusual or allergic reaction to hydrochlorothiazide, sulfa drugs, other medications, foods, dyes, or preservatives Pregnant or trying to get pregnant Breast-feeding How should I use this medication? Take this medication by mouth. Take it as directed on the prescription label at the same time every day. You can take it with or without food. If it upsets your stomach, take it with food. Keep taking it unless your care team tells you to stop. Talk to your care team about the use of this medication in children. While it may be prescribed for children as young as newborns for selected conditions, precautions do apply. Overdosage: If you think you have taken too much of this medicine contact a poison control center or emergency room at once. NOTE: This medicine is only for you. Do not share this medicine with others. What if I miss a dose? If you miss a dose, take it as soon as you can. If it is almost time for your next dose, take only that dose. Do not take double or extra doses. What may interact with this  medication? Cholestyramine Colestipol Digoxin Dofetilide Lithium Medications for blood pressure Medications for diabetes Medications that relax muscles for surgery Other diuretics Steroid medications like prednisone or cortisone This list may not describe all possible interactions. Give your health care provider a list of all the medicines, herbs, non-prescription drugs, or dietary supplements you use. Also tell them if you smoke, drink alcohol, or use illegal drugs. Some items may interact with your medicine. What should I watch for while using this medication? Visit your health care provider for regular check-ups. Check your blood pressure as directed. Ask your health care provider what your blood pressure should be. Also, find out when you should contact him or her. Do not treat yourself for coughs, colds, or pain while you are using this medication without asking your health care provider for advice. Some medications may increase your blood pressure. You may get drowsy or dizzy. Do not drive, use machinery, or do anything that needs mental alertness until you know how this medication affects you. Do not stand or sit up quickly, especially if you are an older patient. This reduces the risk of dizzy or fainting spells. Alcohol can make you more drowsy and dizzy. Avoid alcoholic drinks. Talk to your health care professional about your risk of skin cancer. You may be more at risk for skin cancer if you take this medication. This medication can make you more sensitive to the sun. Keep out of the sun. If you cannot avoid being in the sun, wear protective clothing and use sunscreen. Do not use  sun lamps or tanning beds/booths. You may need to be on a special diet while taking this medication. Ask your health care provider. Also, find out how many glasses of fluids you need to drink each day. Check with your health care provider if you get an attack of severe diarrhea, nausea and vomiting, or if you  sweat a lot. The loss of too much body fluid can make it dangerous for you to take this medication. This medication may increase blood sugar. Ask your healthcare provider if changes in diet or medications are needed if you have diabetes. What side effects may I notice from receiving this medication? Side effects that you should report to your care team as soon as possible: Allergic reactions-skin rash, itching, hives, swelling of the face, lips, tongue, or throat Dehydration-increased thirst, dry mouth, feeling faint or lightheaded, headache, dark yellow or brown urine Gout-severe pain, redness, warmth, or swelling in joints, such as the big toe Kidney injury-decrease in the amount of urine, swelling of the ankles, hands, or feet Low blood pressure-dizziness, feeling faint or lightheaded, blurry vision Low potassium level-muscle pain or cramps, unusual weakness, fatigue, fast or irregular heartbeat, constipation Sudden eye pain or change in vision such as blurred vision, seeing halos around lights, vision loss Side effects that usually do not require medical attention (report to your care team if they continue or are bothersome): Change in sex drive or performance Headache Upset stomach This list may not describe all possible side effects. Call your doctor for medical advice about side effects. You may report side effects to FDA at 1-800-FDA-1088. Where should I keep my medication? Keep out of the reach of children and pets. Store at room temperature between 20 and 25 degrees C (68 and 77 degrees F). Protect from light and moisture. Keep the container tightly closed. Do not freeze. Get rid of any unused medication after the expiration date. To get rid of medications that are no longer needed or have expired: Take the medication to a medication take-back program. Check with your pharmacy or law enforcement to find a location. If you cannot return the medication, check the label or package insert  to see if the medication should be thrown out in the garbage or flushed down the toilet. If you are not sure, ask your care team. If it is safe to put in the trash, empty the medication out of the container. Mix the medication with cat litter, dirt, coffee grounds, or other unwanted substance. Seal the mixture in a bag or container. Put it in the trash. NOTE: This sheet is a summary. It may not cover all possible information. If you have questions about this medicine, talk to your doctor, pharmacist, or health care provider.  2022 Elsevier/Gold Standard (2020-10-13 12:36:20)

## 2021-07-06 LAB — URINALYSIS, ROUTINE W REFLEX MICROSCOPIC
Bilirubin Urine: NEGATIVE
Glucose, UA: NEGATIVE
Hgb urine dipstick: NEGATIVE
Ketones, ur: NEGATIVE
Leukocytes,Ua: NEGATIVE
Nitrite: NEGATIVE
Protein, ur: NEGATIVE
Specific Gravity, Urine: 1.014 (ref 1.001–1.035)
pH: 7.5 (ref 5.0–8.0)

## 2021-07-06 LAB — COMPLETE METABOLIC PANEL WITH GFR
AG Ratio: 1.7 (calc) (ref 1.0–2.5)
ALT: 41 U/L (ref 9–46)
AST: 24 U/L (ref 10–40)
Albumin: 4.8 g/dL (ref 3.6–5.1)
Alkaline phosphatase (APISO): 61 U/L (ref 36–130)
BUN: 10 mg/dL (ref 7–25)
CO2: 29 mmol/L (ref 20–32)
Calcium: 10.4 mg/dL — ABNORMAL HIGH (ref 8.6–10.3)
Chloride: 103 mmol/L (ref 98–110)
Creat: 0.94 mg/dL (ref 0.60–1.29)
Globulin: 2.9 g/dL (calc) (ref 1.9–3.7)
Glucose, Bld: 94 mg/dL (ref 65–99)
Potassium: 4.7 mmol/L (ref 3.5–5.3)
Sodium: 140 mmol/L (ref 135–146)
Total Bilirubin: 0.7 mg/dL (ref 0.2–1.2)
Total Protein: 7.7 g/dL (ref 6.1–8.1)
eGFR: 105 mL/min/{1.73_m2} (ref >=60)

## 2021-07-06 LAB — CBC WITH DIFFERENTIAL/PLATELET
Absolute Monocytes: 708 cells/uL (ref 200–950)
Basophils Absolute: 46 cells/uL (ref 0–200)
Basophils Relative: 0.6 %
Eosinophils Absolute: 377 cells/uL (ref 15–500)
Eosinophils Relative: 4.9 %
HCT: 45.4 % (ref 38.5–50.0)
Hemoglobin: 15.7 g/dL (ref 13.2–17.1)
Lymphs Abs: 3049 cells/uL (ref 850–3900)
MCH: 30.7 pg (ref 27.0–33.0)
MCHC: 34.6 g/dL (ref 32.0–36.0)
MCV: 88.8 fL (ref 80.0–100.0)
MPV: 9.6 fL (ref 7.5–12.5)
Monocytes Relative: 9.2 %
Neutro Abs: 3519 cells/uL (ref 1500–7800)
Neutrophils Relative %: 45.7 %
Platelets: 238 10*3/uL (ref 140–400)
RBC: 5.11 10*6/uL (ref 4.20–5.80)
RDW: 13.1 % (ref 11.0–15.0)
Total Lymphocyte: 39.6 %
WBC: 7.7 10*3/uL (ref 3.8–10.8)

## 2021-07-06 LAB — LIPID PANEL
Cholesterol: 156 mg/dL (ref <200)
HDL: 47 mg/dL (ref >=40)
LDL Cholesterol (Calc): 81 mg/dL (calc)
Non-HDL Cholesterol (Calc): 109 mg/dL (calc) (ref <130)
Total CHOL/HDL Ratio: 3.3 (calc) (ref <5.0)
Triglycerides: 180 mg/dL — ABNORMAL HIGH (ref <150)

## 2021-07-06 LAB — MICROALBUMIN / CREATININE URINE RATIO
Creatinine, Urine: 101 mg/dL (ref 20–320)
Microalb Creat Ratio: 4 mcg/mg creat (ref <30)
Microalb, Ur: 0.4 mg/dL

## 2021-07-06 LAB — MAGNESIUM: Magnesium: 2.2 mg/dL (ref 1.5–2.5)

## 2021-07-06 LAB — HEMOGLOBIN A1C
Hgb A1c MFr Bld: 5.2 % of total Hgb (ref <5.7)
Mean Plasma Glucose: 103 mg/dL
eAG (mmol/L): 5.7 mmol/L

## 2021-07-06 LAB — TSH: TSH: 1.57 mIU/L (ref 0.40–4.50)

## 2021-07-06 LAB — VITAMIN D 25 HYDROXY (VIT D DEFICIENCY, FRACTURES): Vit D, 25-Hydroxy: 116 ng/mL — ABNORMAL HIGH (ref 30–100)

## 2021-07-13 ENCOUNTER — Other Ambulatory Visit: Payer: Self-pay | Admitting: Internal Medicine

## 2021-07-13 DIAGNOSIS — F419 Anxiety disorder, unspecified: Secondary | ICD-10-CM

## 2021-07-13 MED ORDER — ALPRAZOLAM 1 MG PO TABS: ORAL_TABLET | ORAL | 0 refills | Status: DC

## 2021-07-26 ENCOUNTER — Other Ambulatory Visit: Payer: Self-pay | Admitting: Adult Health

## 2021-07-26 DIAGNOSIS — E782 Mixed hyperlipidemia: Secondary | ICD-10-CM

## 2021-08-03 ENCOUNTER — Other Ambulatory Visit: Payer: Self-pay | Admitting: Adult Health

## 2021-08-06 NOTE — Progress Notes (Signed)
Assessment and Plan:  Jason Guerrero was seen today for follow-up.  Diagnoses and all orders for this visit:  Essential hypertension -     olmesartan (BENICAR) 40 MG tablet; Take 1 tablet (40 mg total) by mouth daily. -     nadolol (CORGARD) 40 MG tablet; Take 1 tablet (40 mg total) by mouth daily. for blood pressure  - continue medications, DASH diet, exercise and monitor at home. Call if greater than 130/80.  Follow up in 2 weeks to reassess  Go to the ER if any chest pain, shortness of breath, nausea, dizziness, severe HA, changes vision/speech   Anxiety -     escitalopram (LEXAPRO) 20 MG tablet; Take 1 tablet (20 mg total) by mouth daily. -     busPIRone (BUSPAR) 7.5 MG tablet; Take 1 tablet (7.5 mg total) by mouth 3 (three) times daily. Try behavior modifications such as exercise and meditation to help relieve stress  Chest pain, unspecified type -     EKG 12-Lead Normal EKG, symptoms appear to most likely be related to anxiety Go to the ER if any chest pain, shortness of breath, nausea, dizziness, severe HA, changes vision/speech       Further disposition pending results of labs. Discussed med's effects and SE's.   Over 45 minutes of exam, counseling, chart review, and critical decision making was performed.   Future Appointments  Date Time Provider Department Center  12/17/2021 11:00 AM Lucky Cowboy, MD [facility] None    ------------------------------------------------------------------------------------------------------------------   HPI BP (!) 150/110   Pulse 86   Temp 97.9 F (36.6 C)   Resp 16   Wt 173 lb (78.5 kg)   SpO2 98%   BMI 24.47 kg/m  40 y.o.male presents for hypertension  Bp's are running 120/80-200/100-110. Continues to have increased stress with home and work. Is occasionally having headaches and dizziness.  Does not some occasional tightness in his chest. BP Readings from Last 3 Encounters:  08/09/21 (!) 150/110  07/05/21 132/90  12/07/20  132/84    He is doing slightly better on Lexapro but still having some episodes of feeling down. His anxiety is much more pronounced.  He is not taking more Xanax because he had to wean himself down from previous Xanax use. Continues to have large projects at work and coworker quit so had to take her load of work as well.  Continues to have stress with fiance regarding child custody and is still working through executing his fathers estate.   Past Medical History:  Diagnosis Date   Anxiety    HLD (hyperlipidemia)    Insomnia    Lumbago    Tension headache    Unspecified vitamin D deficiency      Allergies  Allergen Reactions   Sulfa Antibiotics    Trazodone And Nefazodone     Severe dreams    Current Outpatient Medications on File Prior to Visit  Medication Sig   ALPRAZolam (XANAX) 1 MG tablet Take  1/2-1 tablet  at Bedtime  ONLY  if needed for Sleep &  limit to 5 days /week to avoid Addiction & Dementia                           / TAKE 1/2 TO 1 (ONE-HALF TO ONE) TABLET BY MOUTH...............   amitriptyline (ELAVIL) 50 MG tablet Take  1 tablet  3 x /day  for Chronic Neck Pain   b complex vitamins capsule Take 1 capsule  by mouth daily.   Cholecalciferol (VITAMIN D-3) 5000 UNITS TABS Take 2 tablets by mouth daily. Taking 10,000 units   hydrochlorothiazide (HYDRODIURIL) 25 MG tablet Take 1 tablet (25 mg total) by mouth daily.   Magnesium 250 MG TABS Take 1 tablet by mouth daily.    Omega-3 Fatty Acids (FISH OIL PO) Take 300 mg by mouth daily.   omeprazole (PRILOSEC) 40 MG capsule TAKE 1 CAPSULE BY MOUTH ONCE DAILY TO  PREVENT  HEARTBURN  AND  INDIGESTION   rosuvastatin (CRESTOR) 20 MG tablet TAKE 1 TABLET BY MOUTH ONCE DAILY FOR CHOLESTEROL   No current facility-administered medications on file prior to visit.    ROS: all negative except above.   Physical Exam:  BP (!) 150/110   Pulse 86   Temp 97.9 F (36.6 C)   Resp 16   Wt 173 lb (78.5 kg)   SpO2 98%   BMI 24.47 kg/m    General Appearance: Well nourished, in no apparent distress. Eyes: PERRLA, EOMs, conjunctiva no swelling or erythema Sinuses: No Frontal/maxillary tenderness ENT/Mouth: Ext aud canals clear, TMs without erythema, bulging. No erythema, swelling, or exudate on post pharynx.  Tonsils not swollen or erythematous. Hearing normal.  Neck: Supple, thyroid normal.  Respiratory: Respiratory effort normal, BS equal bilaterally without rales, rhonchi, wheezing or stridor.  Cardio: RRR with no MRGs. Brisk peripheral pulses without edema.  Abdomen: Soft, + BS.  Non tender, no guarding, rebound, hernias, masses. Lymphatics: Non tender without lymphadenopathy.  Musculoskeletal: Full ROM, 5/5 strength, normal gait.  Skin: Warm, dry without rashes, lesions, ecchymosis.  Neuro: Cranial nerves intact. Normal muscle tone, no cerebellar symptoms. Sensation intact.  Psych: Awake and oriented X 3, normal affect, Insight and Judgment appropriate.  EKG: NSR  Jomari Bartnik Gigi Gin, NP 3:16 PM [facility]

## 2021-08-09 ENCOUNTER — Ambulatory Visit (INDEPENDENT_AMBULATORY_CARE_PROVIDER_SITE_OTHER): Payer: BC Managed Care – PPO | Admitting: Nurse Practitioner

## 2021-08-09 ENCOUNTER — Encounter: Payer: Self-pay | Admitting: Nurse Practitioner

## 2021-08-09 ENCOUNTER — Other Ambulatory Visit: Payer: Self-pay

## 2021-08-09 VITALS — BP 150/110 | HR 86 | Temp 97.9°F | Resp 16 | Wt 173.0 lb

## 2021-08-09 DIAGNOSIS — R079 Chest pain, unspecified: Secondary | ICD-10-CM | POA: Diagnosis not present

## 2021-08-09 DIAGNOSIS — F419 Anxiety disorder, unspecified: Secondary | ICD-10-CM

## 2021-08-09 DIAGNOSIS — I10 Essential (primary) hypertension: Secondary | ICD-10-CM | POA: Diagnosis not present

## 2021-08-09 MED ORDER — BUSPIRONE HCL 7.5 MG PO TABS: 7.5000 mg | ORAL_TABLET | Freq: Three times a day (TID) | ORAL | 1 refills | Status: DC

## 2021-08-09 MED ORDER — NADOLOL 40 MG PO TABS
40.0000 mg | ORAL_TABLET | Freq: Every day | ORAL | 1 refills | Status: DC
Start: 2021-08-09 — End: 2021-08-29

## 2021-08-09 MED ORDER — ESCITALOPRAM OXALATE 20 MG PO TABS: 20.0000 mg | ORAL_TABLET | Freq: Every day | ORAL | 2 refills | Status: DC

## 2021-08-09 MED ORDER — OLMESARTAN MEDOXOMIL 40 MG PO TABS: 40.0000 mg | ORAL_TABLET | Freq: Every day | ORAL | 11 refills | Status: DC

## 2021-08-09 NOTE — Patient Instructions (Signed)
Stop Nadolol, Continue Hydrochlorothiazide 25 mg every morning Start olmesartan 40 mg 1 tab at bedtime daily   Managing Your Hypertension Hypertension, also called high blood pressure, is when the force of the blood pressing against the walls of the arteries is too strong. Arteries are blood vessels that carry blood from your heart throughout your body. Hypertension forces the heart to work harder to pump blood and may cause the arteries to become narrow or stiff. Understanding blood pressure readings Your personal target blood pressure may vary depending on your medical conditions, your age, and other factors. A blood pressure reading includes a higher number over a lower number. Ideally, your blood pressure should be below 120/80. You should know that: The first, or top, number is called the systolic pressure. It is a measure of the pressure in your arteries as your heart beats. The second, or bottom number, is called the diastolic pressure. It is a measure of the pressure in your arteries as the heart relaxes. Blood pressure is classified into four stages. Based on your blood pressure reading, your health care provider may use the following stages to determine what type of treatment you need, if any. Systolic pressure and diastolic pressure are measured in a unit called mmHg. Normal Systolic pressure: below 120. Diastolic pressure: below 80. Elevated Systolic pressure: 120-129. Diastolic pressure: below 80. Hypertension stage 1 Systolic pressure: 130-139. Diastolic pressure: 80-89. Hypertension stage 2 Systolic pressure: 140 or above. Diastolic pressure: 90 or above. How can this condition affect me? Managing your hypertension is an important responsibility. Over time, hypertension can damage the arteries and decrease blood flow to important parts of the body, including the brain, heart, and kidneys. Having untreated or uncontrolled hypertension can lead to: A heart attack. A stroke. A  weakened blood vessel (aneurysm). Heart failure. Kidney damage. Eye damage. Metabolic syndrome. Memory and concentration problems. Vascular dementia. What actions can I take to manage this condition? Hypertension can be managed by making lifestyle changes and possibly by taking medicines. Your health care provider will help you make a plan to bring your blood pressure within a normal range. Nutrition  Eat a diet that is high in fiber and potassium, and low in salt (sodium), added sugar, and fat. An example eating plan is called the Dietary Approaches to Stop Hypertension (DASH) diet. To eat this way: Eat plenty of fresh fruits and vegetables. Try to fill one-half of your plate at each meal with fruits and vegetables. Eat whole grains, such as whole-wheat pasta, brown rice, or whole-grain bread. Fill about one-fourth of your plate with whole grains. Eat low-fat dairy products. Avoid fatty cuts of meat, processed or cured meats, and poultry with skin. Fill about one-fourth of your plate with lean proteins such as fish, chicken without skin, beans, eggs, and tofu. Avoid pre-made and processed foods. These tend to be higher in sodium, added sugar, and fat. Reduce your daily sodium intake. Most people with hypertension should eat less than 1,500 mg of sodium a day. Lifestyle  Work with your health care provider to maintain a healthy body weight or to lose weight. Ask what an ideal weight is for you. Get at least 30 minutes of exercise that causes your heart to beat faster (aerobic exercise) most days of the week. Activities may include walking, swimming, or biking. Include exercise to strengthen your muscles (resistance exercise), such as weight lifting, as part of your weekly exercise routine. Try to do these types of exercises for 30 minutes at  least 3 days a week. Do not use any products that contain nicotine or tobacco, such as cigarettes, e-cigarettes, and chewing tobacco. If you need help  quitting, ask your health care provider. Control any long-term (chronic) conditions you have, such as high cholesterol or diabetes. Identify your sources of stress and find ways to manage stress. This may include meditation, deep breathing, or making time for fun activities. Alcohol use Do not drink alcohol if: Your health care provider tells you not to drink. You are pregnant, may be pregnant, or are planning to become pregnant. If you drink alcohol: Limit how much you use to: 0-1 drink a day for women. 0-2 drinks a day for men. Be aware of how much alcohol is in your drink. In the [place] one drink equals one 12 oz bottle of beer (355 mL), one 5 oz glass of wine (148 mL), or one 1 oz glass of hard liquor (44 mL). Medicines Your health care provider may prescribe medicine if lifestyle changes are not enough to get your blood pressure under control and if: Your systolic blood pressure is 130 or higher. Your diastolic blood pressure is 80 or higher. Take medicines only as told by your health care provider. Follow the directions carefully. Blood pressure medicines must be taken as told by your health care provider. The medicine does not work as well when you skip doses. Skipping doses also puts you at risk for problems. Monitoring Before you monitor your blood pressure: Do not smoke, drink caffeinated beverages, or exercise within 30 minutes before taking a measurement. Use the bathroom and empty your bladder (urinate). Sit quietly for at least 5 minutes before taking measurements. Monitor your blood pressure at home as told by your health care provider. To do this: Sit with your back straight and supported. Place your feet flat on the floor. Do not cross your legs. Support your arm on a flat surface, such as a table. Make sure your upper arm is at heart level. Each time you measure, take two or three readings one minute apart and record the results. You may also need to have your blood  pressure checked regularly by your health care provider. General information Talk with your health care provider about your diet, exercise habits, and other lifestyle factors that may be contributing to hypertension. Review all the medicines you take with your health care provider because there may be side effects or interactions. Keep all visits as told by your health care provider. Your health care provider can help you create and adjust your plan for managing your high blood pressure. Where to find more information National Heart, Lung, and Blood Institute: [employer]is American Heart Association: www.heart.org Contact a health care provider if: You think you are having a reaction to medicines you have taken. You have repeated (recurrent) headaches. You feel dizzy. You have swelling in your ankles. You have trouble with your vision. Get help right away if: You develop a severe headache or confusion. You have unusual weakness or numbness, or you feel faint. You have severe pain in your chest or abdomen. You vomit repeatedly. You have trouble breathing. These symptoms may represent a serious problem that is an emergency. Do not wait to see if the symptoms will go away. Get medical help right away. Call your local emergency services (911 in the [place] Do not drive yourself to the hospital. Summary Hypertension is when the force of blood pumping through your arteries is too strong. If this condition is not  controlled, it may put you at risk for serious complications. Your personal target blood pressure may vary depending on your medical conditions, your age, and other factors. For most people, a normal blood pressure is less than 120/80. Hypertension is managed by lifestyle changes, medicines, or both. Lifestyle changes to help manage hypertension include losing weight, eating a healthy, low-sodium diet, exercising more, stopping smoking, and limiting alcohol. This information is not  intended to replace advice given to you by your health care provider. Make sure you discuss any questions you have with your health care provider. Document Revised: 10/04/2019 Document Reviewed: 08/17/2019 Elsevier Patient Education  2022 [employer].

## 2021-08-16 IMAGING — CR DG CHEST 2V
2 series · 2 of 2 positions shown · non-contrast
Comparison: January 23, 2011

CLINICAL DATA: Shortness of breath with exertion.

EXAM:
CHEST - 2 VIEW

[w chest pa]
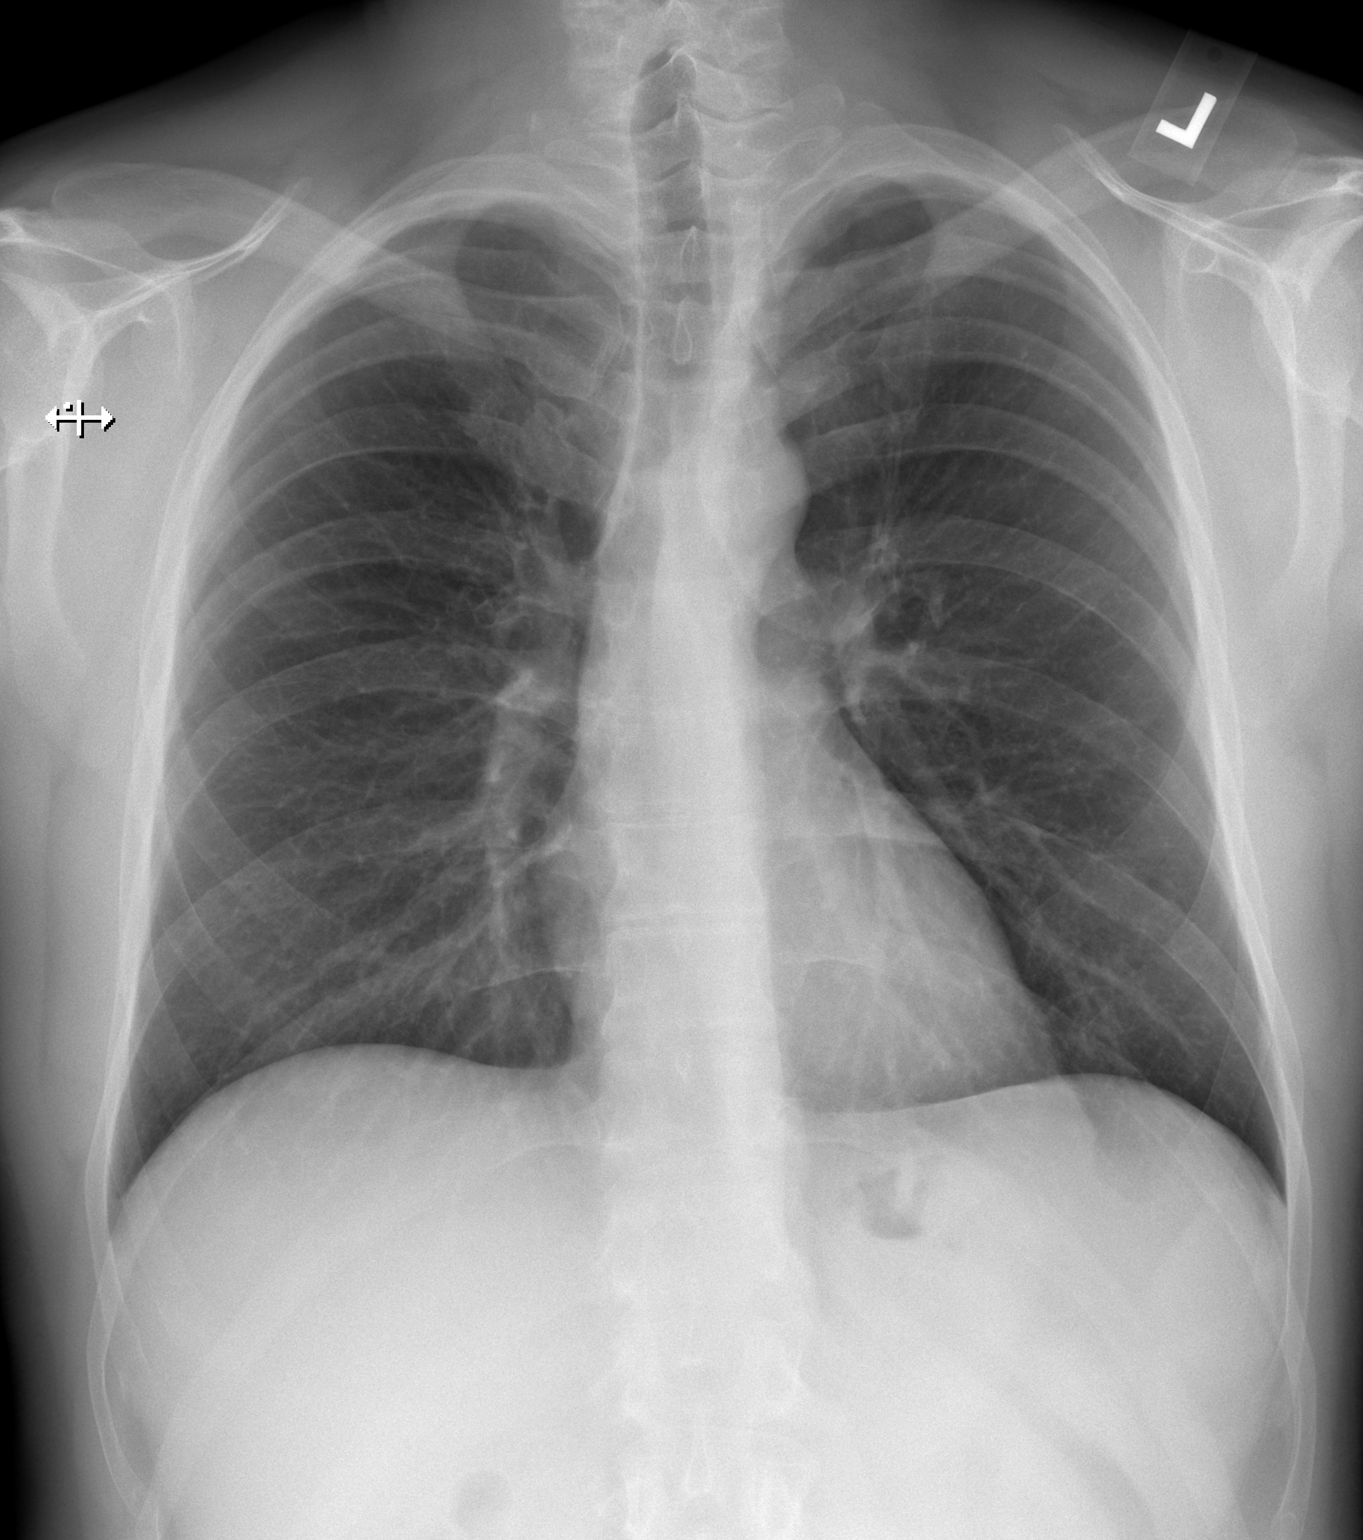

[w chest lat]
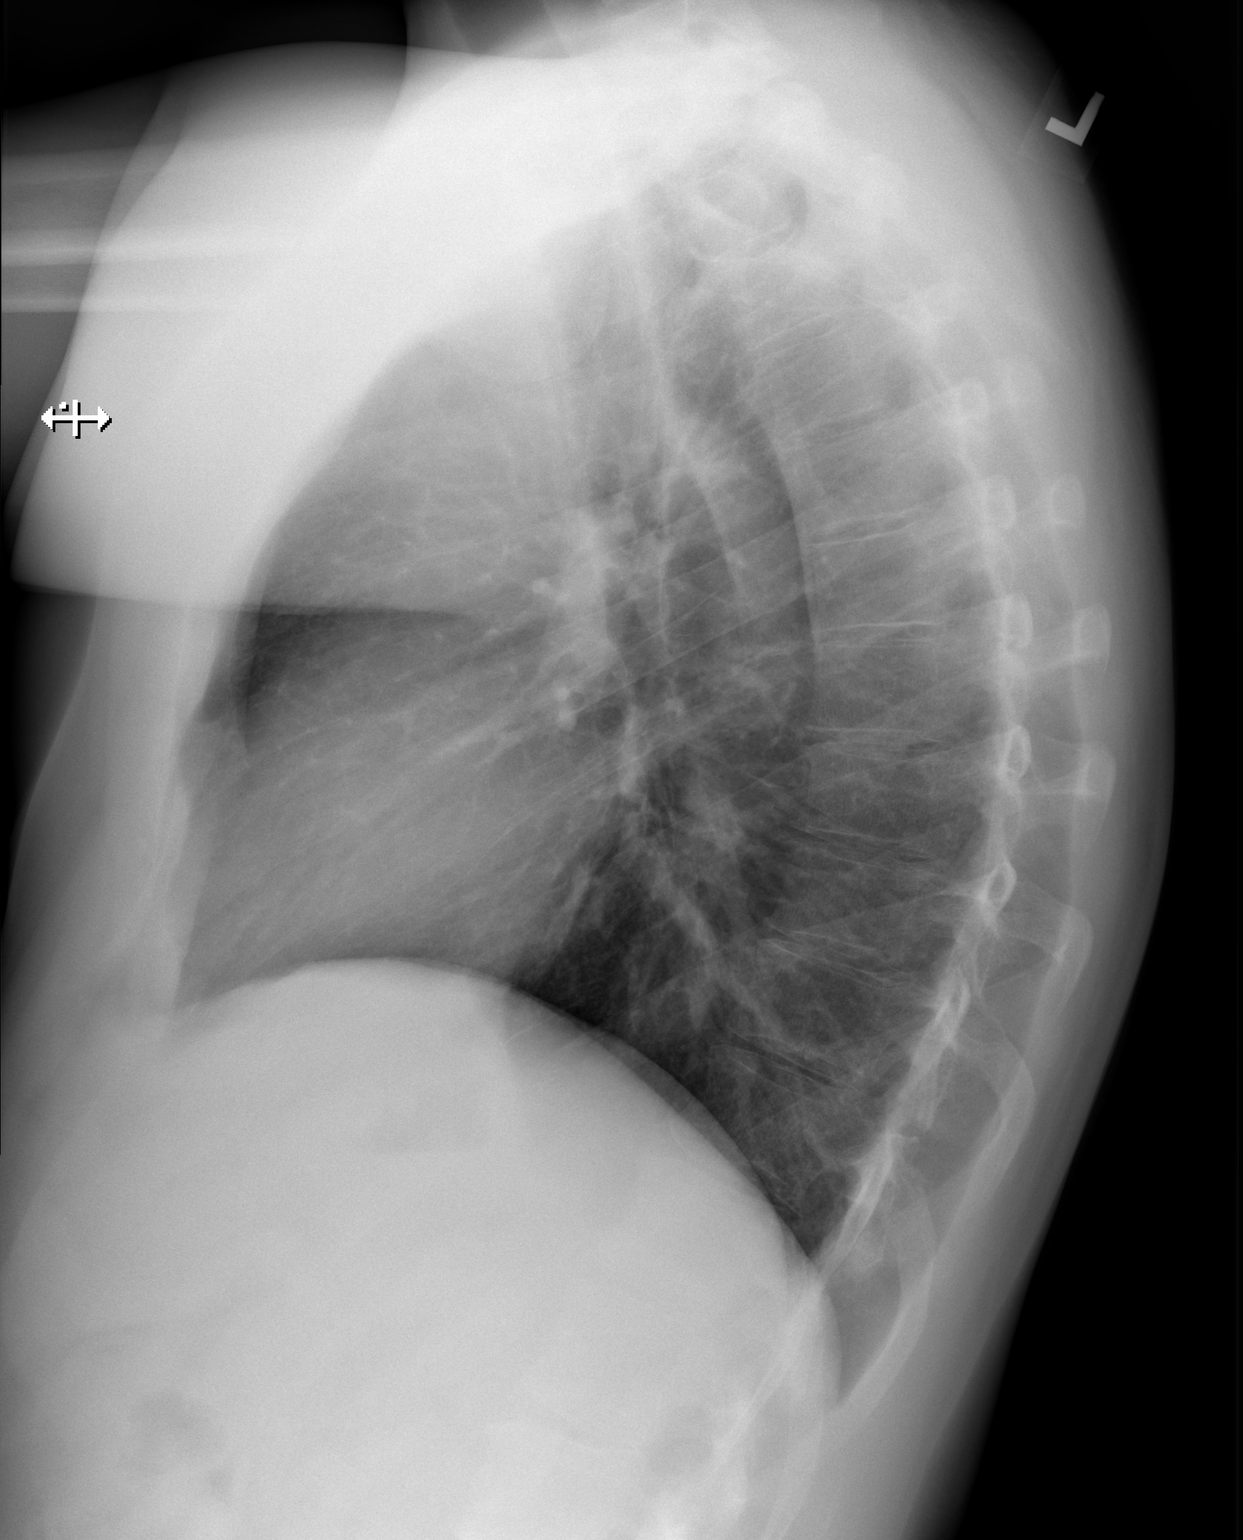

[2 of 2 positions shown; findings below may reference images not displayed]

FINDINGS: The heart size and mediastinal contours are within normal limits.
Both lungs are clear. The visualized skeletal structures are
unremarkable.
IMPRESSION: No active cardiopulmonary disease.

## 2021-08-20 ENCOUNTER — Encounter: Payer: Self-pay | Admitting: Nurse Practitioner

## 2021-08-22 ENCOUNTER — Other Ambulatory Visit: Payer: Self-pay | Admitting: Nurse Practitioner

## 2021-08-22 DIAGNOSIS — F419 Anxiety disorder, unspecified: Secondary | ICD-10-CM

## 2021-08-22 MED ORDER — ALPRAZOLAM 1 MG PO TABS
ORAL_TABLET | ORAL | 0 refills | Status: DC
Start: 2021-08-22 — End: 2021-10-02

## 2021-08-27 NOTE — Progress Notes (Deleted)
Assessment and Plan:  Danniel was seen today for follow-up.  Diagnoses and all orders for this visit:  Essential hypertension -     olmesartan (BENICAR) 40 MG tablet; Take 1 tablet (40 mg total) by mouth daily. -     nadolol (CORGARD) 40 MG tablet; Take 1 tablet (40 mg total) by mouth daily. for blood pressure  - continue medications, DASH diet, exercise and monitor at home. Call if greater than 130/80.  Follow up in 2 weeks to reassess  Go to the ER if any chest pain, shortness of breath, nausea, dizziness, severe HA, changes vision/speech   Anxiety -     escitalopram (LEXAPRO) 20 MG tablet; Take 1 tablet (20 mg total) by mouth daily. -     busPIRone (BUSPAR) 7.5 MG tablet; Take 1 tablet (7.5 mg total) by mouth 3 (three) times daily. Try behavior modifications such as exercise and meditation to help relieve stress  Chest pain, unspecified type -     EKG 12-Lead Normal EKG, symptoms appear to most likely be related to anxiety Go to the ER if any chest pain, shortness of breath, nausea, dizziness, severe HA, changes vision/speech       Further disposition pending results of labs. Discussed med's effects and SE's.   Over 45 minutes of exam, counseling, chart review, and critical decision making was performed.   Future Appointments  Date Time Provider Department Center  08/28/2021  3:30 PM Revonda Humphrey, NP [facility] None  12/17/2021 11:00 AM Lucky Cowboy, MD [facility] None    ------------------------------------------------------------------------------------------------------------------   HPI There were no vitals taken for this visit. 40 y.o.male presents for hypertension  Bp's are running 120/80-200/100-110. Continues to have increased stress with home and work. Is occasionally having headaches and dizziness.  Does not some occasional tightness in his chest. BP Readings from Last 3 Encounters:  08/09/21 (!) 150/110  07/05/21 132/90  12/07/20 132/84    He is doing  slightly better on Lexapro but still having some episodes of feeling down. His anxiety is much more pronounced.  He is not taking more Xanax because he had to wean himself down from previous Xanax use. Continues to have large projects at work and coworker quit so had to take her load of work as well.  Continues to have stress with fiance regarding child custody and is still working through executing his fathers estate.   Past Medical History:  Diagnosis Date   Anxiety    HLD (hyperlipidemia)    Insomnia    Lumbago    Tension headache    Unspecified vitamin D deficiency      Allergies  Allergen Reactions   Sulfa Antibiotics    Trazodone And Nefazodone     Severe dreams    Current Outpatient Medications on File Prior to Visit  Medication Sig   ALPRAZolam (XANAX) 1 MG tablet Take  1/2-1 tablet  at Bedtime  ONLY  if needed for Sleep &  limit to 5 days /week to avoid Addiction & Dementia                           / TAKE 1/2 TO 1 (ONE-HALF TO ONE) TABLET BY MOUTH...............   amitriptyline (ELAVIL) 50 MG tablet Take  1 tablet  3 x /day  for Chronic Neck Pain   b complex vitamins capsule Take 1 capsule by mouth daily.   busPIRone (BUSPAR) 7.5 MG tablet Take 1 tablet (7.5 mg total)  by mouth 3 (three) times daily.   Cholecalciferol (VITAMIN D-3) 5000 UNITS TABS Take 2 tablets by mouth daily. Taking 10,000 units   escitalopram (LEXAPRO) 20 MG tablet Take 1 tablet (20 mg total) by mouth daily.   hydrochlorothiazide (HYDRODIURIL) 25 MG tablet Take 1 tablet (25 mg total) by mouth daily.   Magnesium 250 MG TABS Take 1 tablet by mouth daily.    nadolol (CORGARD) 40 MG tablet Take 1 tablet (40 mg total) by mouth daily. for blood pressure   olmesartan (BENICAR) 40 MG tablet Take 1 tablet (40 mg total) by mouth daily.   Omega-3 Fatty Acids (FISH OIL PO) Take 300 mg by mouth daily.   omeprazole (PRILOSEC) 40 MG capsule TAKE 1 CAPSULE BY MOUTH ONCE DAILY TO  PREVENT  HEARTBURN  AND  INDIGESTION    rosuvastatin (CRESTOR) 20 MG tablet TAKE 1 TABLET BY MOUTH ONCE DAILY FOR CHOLESTEROL   No current facility-administered medications on file prior to visit.    ROS: all negative except above.   Physical Exam:  There were no vitals taken for this visit.  General Appearance: Well nourished, in no apparent distress. Eyes: PERRLA, EOMs, conjunctiva no swelling or erythema Sinuses: No Frontal/maxillary tenderness ENT/Mouth: Ext aud canals clear, TMs without erythema, bulging. No erythema, swelling, or exudate on post pharynx.  Tonsils not swollen or erythematous. Hearing normal.  Neck: Supple, thyroid normal.  Respiratory: Respiratory effort normal, BS equal bilaterally without rales, rhonchi, wheezing or stridor.  Cardio: RRR with no MRGs. Brisk peripheral pulses without edema.  Abdomen: Soft, + BS.  Non tender, no guarding, rebound, hernias, masses. Lymphatics: Non tender without lymphadenopathy.  Musculoskeletal: Full ROM, 5/5 strength, normal gait.  Skin: Warm, dry without rashes, lesions, ecchymosis.  Neuro: Cranial nerves intact. Normal muscle tone, no cerebellar symptoms. Sensation intact.  Psych: Awake and oriented X 3, normal affect, Insight and Judgment appropriate.  EKG: NSR  Allysha Tryon Gigi Gin, NP 10:04 AM [facility]

## 2021-08-28 ENCOUNTER — Ambulatory Visit: Payer: BC Managed Care – PPO | Admitting: Nurse Practitioner

## 2021-08-28 DIAGNOSIS — F32A Depression, unspecified: Secondary | ICD-10-CM | POA: Insufficient documentation

## 2021-08-28 NOTE — Progress Notes (Deleted)
Assessment and Plan:  Lorance was seen today for follow-up.  Diagnoses and all orders for this visit:  Essential hypertension -     olmesartan (BENICAR) 40 MG tablet; Take 1 tablet (40 mg total) by mouth daily. -     nadolol (CORGARD) 40 MG tablet; Take 1 tablet (40 mg total) by mouth daily. for blood pressure  - continue medications, DASH diet, exercise and monitor at home. Call if greater than 130/80.  Follow up in 2 weeks to reassess  Go to the ER if any chest pain, shortness of breath, nausea, dizziness, severe HA, changes vision/speech   Anxiety -     escitalopram (LEXAPRO) 20 MG tablet; Take 1 tablet (20 mg total) by mouth daily. -     busPIRone (BUSPAR) 7.5 MG tablet; Take 1 tablet (7.5 mg total) by mouth 3 (three) times daily. Try behavior modifications such as exercise and meditation to help relieve stress   Further disposition pending results of labs. Discussed med's effects and SE's.   Over 45 minutes of exam, counseling, chart review, and critical decision making was performed.   Future Appointments  Date Time Provider Department Center  08/29/2021  8:45 AM Judd Gaudier, NP [facility] None  12/17/2021 11:00 AM Lucky Cowboy, MD [facility] None    ------------------------------------------------------------------------------------------------------------------   HPI There were no vitals taken for this visit. 40 y.o.male presents for hypertension  Bp's are running 120/80-200/100-110. Continues to have increased stress with home and work. Is occasionally having headaches and dizziness.  Does not some occasional tightness in his chest (had negative EKG checked last visit)  BP Readings from Last 3 Encounters:  08/09/21 (!) 150/110  07/05/21 132/90  12/07/20 132/84    He is doing slightly better on Lexapro but still having some episodes of feeling down. His anxiety is much more pronounced.  He is not taking more Xanax because he had to wean himself down from previous  Xanax use. Continues to have large projects at work and coworker quit so had to take her load of work as well.  Continues to have stress with fiance regarding child custody and is still working through executing his fathers estate.  Alcohol ***   Past Medical History:  Diagnosis Date   Anxiety    HLD (hyperlipidemia)    Insomnia    Lumbago    Tension headache    Unspecified vitamin D deficiency      Allergies  Allergen Reactions   Sulfa Antibiotics    Trazodone And Nefazodone     Severe dreams    Current Outpatient Medications on File Prior to Visit  Medication Sig   ALPRAZolam (XANAX) 1 MG tablet Take  1/2-1 tablet  at Bedtime  ONLY  if needed for Sleep &  limit to 5 days /week to avoid Addiction & Dementia                           / TAKE 1/2 TO 1 (ONE-HALF TO ONE) TABLET BY MOUTH...............   amitriptyline (ELAVIL) 50 MG tablet Take  1 tablet  3 x /day  for Chronic Neck Pain   b complex vitamins capsule Take 1 capsule by mouth daily.   busPIRone (BUSPAR) 7.5 MG tablet Take 1 tablet (7.5 mg total) by mouth 3 (three) times daily.   Cholecalciferol (VITAMIN D-3) 5000 UNITS TABS Take 2 tablets by mouth daily. Taking 10,000 units   escitalopram (LEXAPRO) 20 MG tablet Take 1 tablet (20 mg  total) by mouth daily.   hydrochlorothiazide (HYDRODIURIL) 25 MG tablet Take 1 tablet (25 mg total) by mouth daily.   Magnesium 250 MG TABS Take 1 tablet by mouth daily.    nadolol (CORGARD) 40 MG tablet Take 1 tablet (40 mg total) by mouth daily. for blood pressure   olmesartan (BENICAR) 40 MG tablet Take 1 tablet (40 mg total) by mouth daily.   Omega-3 Fatty Acids (FISH OIL PO) Take 300 mg by mouth daily.   omeprazole (PRILOSEC) 40 MG capsule TAKE 1 CAPSULE BY MOUTH ONCE DAILY TO  PREVENT  HEARTBURN  AND  INDIGESTION   rosuvastatin (CRESTOR) 20 MG tablet TAKE 1 TABLET BY MOUTH ONCE DAILY FOR CHOLESTEROL   No current facility-administered medications on file prior to visit.    ROS: all  negative except above.   Physical Exam:  There were no vitals taken for this visit.  General Appearance: Well nourished, in no apparent distress. Eyes: PERRLA, EOMs, conjunctiva no swelling or erythema Sinuses: No Frontal/maxillary tenderness ENT/Mouth: Ext aud canals clear, TMs without erythema, bulging. No erythema, swelling, or exudate on post pharynx.  Tonsils not swollen or erythematous. Hearing normal.  Neck: Supple, thyroid normal.  Respiratory: Respiratory effort normal, BS equal bilaterally without rales, rhonchi, wheezing or stridor.  Cardio: RRR with no MRGs. Brisk peripheral pulses without edema.  Abdomen: Soft, + BS.  Non tender, no guarding, rebound, hernias, masses. Lymphatics: Non tender without lymphadenopathy.  Musculoskeletal: Full ROM, 5/5 strength, normal gait.  Skin: Warm, dry without rashes, lesions, ecchymosis.  Neuro: Cranial nerves intact. Normal muscle tone, no cerebellar symptoms. Sensation intact.  Psych: Awake and oriented X 3, normal affect, Insight and Judgment appropriate.  EKG: NSR  Dan Maker, NP 8:39 AM [facility]

## 2021-08-29 ENCOUNTER — Other Ambulatory Visit: Payer: Self-pay

## 2021-08-29 ENCOUNTER — Ambulatory Visit: Payer: BC Managed Care – PPO | Admitting: Nurse Practitioner

## 2021-08-29 ENCOUNTER — Ambulatory Visit (INDEPENDENT_AMBULATORY_CARE_PROVIDER_SITE_OTHER): Payer: BC Managed Care – PPO | Admitting: Adult Health

## 2021-08-29 ENCOUNTER — Encounter: Payer: Self-pay | Admitting: Adult Health

## 2021-08-29 VITALS — BP 104/72 | HR 100 | Temp 97.7°F | Wt 177.4 lb

## 2021-08-29 DIAGNOSIS — G4700 Insomnia, unspecified: Secondary | ICD-10-CM

## 2021-08-29 DIAGNOSIS — Z79899 Other long term (current) drug therapy: Secondary | ICD-10-CM

## 2021-08-29 DIAGNOSIS — F32A Depression, unspecified: Secondary | ICD-10-CM

## 2021-08-29 DIAGNOSIS — F419 Anxiety disorder, unspecified: Secondary | ICD-10-CM

## 2021-08-29 DIAGNOSIS — I10 Essential (primary) hypertension: Secondary | ICD-10-CM

## 2021-08-29 MED ORDER — OLMESARTAN MEDOXOMIL 20 MG PO TABS: 20.0000 mg | ORAL_TABLET | Freq: Every day | ORAL | 1 refills | Status: DC

## 2021-08-29 MED ORDER — NADOLOL 20 MG PO TABS: 20.0000 mg | ORAL_TABLET | Freq: Every day | ORAL | 1 refills | Status: DC

## 2021-08-29 NOTE — Progress Notes (Signed)
Assessment and Plan:  Jason Guerrero was seen today for 2 week follow-up.  Diagnoses and all orders for this visit:  Essential hypertension Continue medications as current taking; scripts updated Monitor blood pressure at home; call if consistently over 130/80 Continue DASH diet.   Reminder to go to the ER if any CP, SOB, nausea, dizziness, severe HA, changes vision/speech, left arm numbness and tingling and jaw pain. -     olmesartan (BENICAR) 20 MG tablet; Take 1 tablet (20 mg total) by mouth daily. -     nadolol (CORGARD) 20 MG tablet; Take 1 tablet (20 mg total) by mouth daily. for blood pressure -     BASIC METABOLIC PANEL WITH GFR  Anxiety and depression Continue meds; suggested grief counseling Stress management techniques discussed, increase water, good sleep hygiene discussed, increase exercise, and increase veggies.   Insomnia, unspecified type Currently using xanax daily; he has tapered down on dose; discussed risks of chronic benzo use; he has gabapentin at home he can retry; suggested trying 2 x 100 mg caps 2 nights a week instead of xanax.   Medication management -     BASIC METABOLIC PANEL WITH GFR   Further disposition pending results of labs. Discussed med's effects and SE's.   Over 20 minutes of exam, counseling, chart review, and critical decision making was performed.   Future Appointments  Date Time Provider Department Center  12/17/2021 11:00 AM Lucky Cowboy, MD [facility] None    ------------------------------------------------------------------------------------------------------------------   HPI BP 104/72   Pulse 100   Temp 97.7 F (36.5 C)   Wt 177 lb 6.4 oz (80.5 kg)   SpO2 98%   BMI 25.09 kg/m  40 y.o.male presents for hypertension and mood.   Bp's were running 120/80-200/100-110. He was on nadolol and hctz, olmesartan 40 mg was added. He reported hypotension with syncope, nadolol was reduced to 20 mg and olmesartan was reduced to 20 mg daily.  Continues HCTZ 25 mg in AM. He reports good BP home values since that time.   Was having increased stress with home and work, split from fiance and dad passed after planned surgery. He reports stress is improving. He is on lexapro 20 mg, Xanax 1 mg PRN (taking at night, doing slow taper down on dose). Has buspar PRN but hasn't needed during the day.   He had reported occasional tightness in his chest (had negative EKG checked last visit). Denies since that time.  BP Readings from Last 3 Encounters:  08/29/21 104/72  08/09/21 (!) 150/110  07/05/21 132/90      Past Medical History:  Diagnosis Date   Anxiety    HLD (hyperlipidemia)    Insomnia    Lumbago    Tension headache    Unspecified vitamin D deficiency      Allergies  Allergen Reactions   Sulfa Antibiotics    Trazodone And Nefazodone     Severe dreams    Current Outpatient Medications on File Prior to Visit  Medication Sig   ALPRAZolam (XANAX) 1 MG tablet Take  1/2-1 tablet  at Bedtime  ONLY  if needed for Sleep &  limit to 5 days /week to avoid Addiction & Dementia                           / TAKE 1/2 TO 1 (ONE-HALF TO ONE) TABLET BY MOUTH...............   amitriptyline (ELAVIL) 50 MG tablet Take  1 tablet  3 x /day  for Chronic Neck Pain   b complex vitamins capsule Take 1 capsule by mouth daily.   busPIRone (BUSPAR) 7.5 MG tablet Take 1 tablet (7.5 mg total) by mouth 3 (three) times daily.   Cholecalciferol (VITAMIN D-3) 5000 UNITS TABS Take 2 tablets by mouth daily. Taking 10,000 units   escitalopram (LEXAPRO) 20 MG tablet Take 1 tablet (20 mg total) by mouth daily.   hydrochlorothiazide (HYDRODIURIL) 25 MG tablet Take 1 tablet (25 mg total) by mouth daily.   Magnesium 250 MG TABS Take 1 tablet by mouth daily.    Omega-3 Fatty Acids (FISH OIL PO) Take 300 mg by mouth daily.   omeprazole (PRILOSEC) 40 MG capsule TAKE 1 CAPSULE BY MOUTH ONCE DAILY TO  PREVENT  HEARTBURN  AND  INDIGESTION   rosuvastatin (CRESTOR) 20 MG  tablet TAKE 1 TABLET BY MOUTH ONCE DAILY FOR CHOLESTEROL   No current facility-administered medications on file prior to visit.    ROS: all negative except above.   Physical Exam:  BP 104/72   Pulse 100   Temp 97.7 F (36.5 C)   Wt 177 lb 6.4 oz (80.5 kg)   SpO2 98%   BMI 25.09 kg/m   General Appearance: Well nourished, in no apparent distress. Eyes: PERRLA, conjunctiva no swelling or erythema ENT/Mouth: mask in place; Hearing normal.  Neck: Supple, thyroid normal.  Respiratory: Respiratory effort normal, BS equal bilaterally without rales, rhonchi, wheezing or stridor.  Cardio: RRR with no MRGs. Brisk peripheral pulses without edema.  Abdomen: Soft, + BS.  Non tender, no guarding, rebound, hernias, masses. Lymphatics: Non tender without lymphadenopathy.  Musculoskeletal: no obvious deformity; normal gait.  Skin: Warm, dry without rashes, lesions, ecchymosis.  Neuro: Normal muscle tone Psych: Awake and oriented X 3, normal affect, Insight and Judgment appropriate.    Dan Maker, NP 9:23 AM [facility]

## 2021-08-30 LAB — BASIC METABOLIC PANEL WITH GFR
BUN: 11 mg/dL (ref 7–25)
CO2: 28 mmol/L (ref 20–32)
Calcium: 10.5 mg/dL — ABNORMAL HIGH (ref 8.6–10.3)
Chloride: 101 mmol/L (ref 98–110)
Creat: 0.85 mg/dL (ref 0.60–1.29)
Glucose, Bld: 92 mg/dL (ref 65–99)
Potassium: 4.5 mmol/L (ref 3.5–5.3)
Sodium: 140 mmol/L (ref 135–146)
eGFR: 113 mL/min/{1.73_m2} (ref >=60)

## 2021-09-20 ENCOUNTER — Other Ambulatory Visit: Payer: Self-pay | Admitting: Nurse Practitioner

## 2021-09-20 DIAGNOSIS — K219 Gastro-esophageal reflux disease without esophagitis: Secondary | ICD-10-CM

## 2021-10-02 ENCOUNTER — Other Ambulatory Visit: Payer: Self-pay | Admitting: Nurse Practitioner

## 2021-10-02 DIAGNOSIS — F419 Anxiety disorder, unspecified: Secondary | ICD-10-CM

## 2021-10-23 ENCOUNTER — Encounter: Payer: BC Managed Care – PPO | Admitting: Internal Medicine

## 2021-10-25 ENCOUNTER — Other Ambulatory Visit: Payer: Self-pay | Admitting: Nurse Practitioner

## 2021-10-25 DIAGNOSIS — E782 Mixed hyperlipidemia: Secondary | ICD-10-CM

## 2021-10-27 ENCOUNTER — Other Ambulatory Visit: Payer: Self-pay | Admitting: Nurse Practitioner

## 2021-10-27 DIAGNOSIS — F419 Anxiety disorder, unspecified: Secondary | ICD-10-CM

## 2021-11-01 ENCOUNTER — Other Ambulatory Visit: Payer: Self-pay | Admitting: Nurse Practitioner

## 2021-11-01 DIAGNOSIS — I10 Essential (primary) hypertension: Secondary | ICD-10-CM

## 2021-11-12 ENCOUNTER — Other Ambulatory Visit: Payer: Self-pay | Admitting: Adult Health

## 2021-11-12 DIAGNOSIS — F419 Anxiety disorder, unspecified: Secondary | ICD-10-CM

## 2021-12-16 NOTE — Patient Instructions (Signed)
Due to recent changes in healthcare laws, you may see the results of your imaging and laboratory studies on MyChart before your provider has had a chance to review them.  We understand that in some cases there may be results that are confusing or concerning to you. Not all laboratory results come back in the same time frame and the provider may be waiting for multiple results in order to interpret others.  Please give us 48 hours in order for your provider to thoroughly review all the results before contacting the office for clarification of your results.  ? ?++++++++++++++++++++++++++++++++++++++ ? Vit D  & ?Vit C 1,000 mg   ?are recommended to help protect  ?against the Covid-19 and other Corona viruses.  ? ? Also it's recommended  ?to take  ?Zinc 50 mg  ?to help  ?protect against the Covid-19   ?and best place to get ? is also on [employer]  ?and don't pay more than 6-8 cents /pill !  ?=============================== ?Coronavirus (COVID-19) Are you at risk? ? ?Are you at risk for the Coronavirus (COVID-19)? ? ?To be considered HIGH RISK for Coronavirus (COVID-19), you have to meet the following criteria: ? ?Traveled to [place], [place], [place], [place] or [place]; or in the [place] to [place], [place], [place]  ?or [place]; and have fever, cough, and shortness of breath within the last 2 weeks of travel OR ?Been in close contact with a person diagnosed with COVID-19 within the last 2 weeks and have  ?fever, cough,and shortness of breath ? ?IF YOU DO NOT MEET THESE CRITERIA, YOU ARE CONSIDERED LOW RISK FOR COVID-19. ? ?What to do if you are HIGH RISK for COVID-19? ? ?If you are having a medical emergency, call 911. ?Seek medical care right away. Before you go to a doctor?s office, urgent care or emergency department, ? call ahead and tell them about your recent travel, contact with someone diagnosed with COVID-19  ? and your symptoms.  ?You should receive instructions from your physician?s office  regarding next steps of care.  ?When you arrive at healthcare provider, tell the healthcare staff immediately you have returned from  ?visiting [place], [place], [place], [place] or [place]; or traveled in the [place] to [place], [place],  ?[place] or [place] in the last two weeks or you have been in close contact with a person diagnosed with  ?COVID-19 in the last 2 weeks.   ?Tell the health care staff about your symptoms: fever, cough and shortness of breath. ?After you have been seen by a medical provider, you will be either: ?Tested for (COVID-19) and discharged home on quarantine except to seek medical care if  ?symptoms worsen, and asked to  ?Stay home and avoid contact with others until you get your results (4-5 days)  ?Avoid travel on public transportation if possible (such as bus, train, or airplane) or ?Sent to the Emergency Department by EMS for evaluation, COVID-19 testing  and  ?possible admission depending on your condition and test results. ? ?What to do if you are LOW RISK for COVID-19? ? ?Reduce your risk of any infection by using the same precautions used for avoiding the common cold or flu:  ?Wash your hands often with soap and warm water for at least 20 seconds.  If soap and water are not readily available,  ?use an alcohol-based hand sanitizer with at least 60% alcohol.  ?If coughing or sneezing, cover your mouth and nose by coughing   or sneezing into the elbow areas of your shirt or coat, ? into a tissue or into your sleeve (not your hands). ?Avoid shaking hands with others and consider head nods or verbal greetings only. ?Avoid touching your eyes, nose, or mouth with unwashed hands.  ?Avoid close contact with people who are sick. ?Avoid places or events with large numbers of people in one location, like concerts or sporting events. ?Carefully consider travel plans you have or are making. ?If you are planning any travel outside or inside the [place], visit the CDC?s Travelers? Health  webpage for the latest health notices. ?If you have some symptoms but not all symptoms, continue to monitor at home and seek medical attention  ?if your symptoms worsen. ?If you are having a medical emergency, call 911. ?>>>>>>>>>>>>>>>>>>>>>>>>>>>> ?Preventive Care for Adults ? ?A healthy lifestyle and preventive care can promote health and wellness. Preventive health guidelines for men include the following key practices: ?A routine yearly physical is a good way to check with your health care provider about your health and preventative screening. It is a chance to share any concerns and updates on your health and to receive a thorough exam. ?Visit your dentist for a routine exam and preventative care every 6 months. Brush your teeth twice a day and floss once a day. Good oral hygiene prevents tooth decay and gum disease. ?The frequency of eye exams is based on your age, health, family medical history, use of contact lenses, and other factors. Follow your health care provider's recommendations for frequency of eye exams. ?Eat a healthy diet. Foods such as vegetables, fruits, whole grains, low-fat dairy products, and lean protein foods contain the nutrients you need without too many calories. Decrease your intake of foods high in solid fats, added sugars, and salt. Eat the right amount of calories for you. Get information about a proper diet from your health care provider, if necessary. ?Regular physical exercise is one of the most important things you can do for your health. Most adults should get at least 150 minutes of moderate-intensity exercise (any activity that increases your heart rate and causes you to sweat) each week. In addition, most adults need muscle-strengthening exercises on 2 or more days a week. ?Maintain a healthy weight. The body mass index (BMI) is a screening tool to identify possible weight problems. It provides an estimate of body fat based on height and weight. Your health care provider can  find your BMI and can help you achieve or maintain a healthy weight. For adults 20 years and older: ?A BMI below 18.5 is considered underweight. ?A BMI of 18.5 to 24.9 is normal. ?A BMI of 25 to 29.9 is considered overweight. ?A BMI of 30 and above is considered obese. ?Maintain normal blood lipids and cholesterol levels by exercising and minimizing your intake of saturated fat. Eat a balanced diet with plenty of fruit and vegetables. Blood tests for lipids and cholesterol should begin at age 20 and be repeated every 5 years. If your lipid or cholesterol levels are high, you are over 50, or you are at high risk for heart disease, you may need your cholesterol levels checked more frequently. Ongoing high lipid and cholesterol levels should be treated with medicines if diet and exercise are not working. ?If you smoke, find out from your health care provider how to quit. If you do not use tobacco, do not start. ?Lung cancer screening is recommended for adults aged 55-80 years who are at high risk for   developing lung cancer because of a history of smoking. A yearly low-dose CT scan of the lungs is recommended for people who have at least a 30-pack-year history of smoking and are a current smoker or have quit within the past 15 years. A pack year of smoking is smoking an average of 1 pack of cigarettes a day for 1 year (for example: 1 pack a day for 30 years or 2 packs a day for 15 years). Yearly screening should continue until the smoker has stopped smoking for at least 15 years. Yearly screening should be stopped for people who develop a health problem that would prevent them from having lung cancer treatment. ?If you choose to drink alcohol, do not have more than 2 drinks per day. One drink is considered to be 12 ounces (355 mL) of beer, 5 ounces (148 mL) of wine, or 1.5 ounces (44 mL) of liquor. ?Avoid use of street drugs. Do not share needles with anyone. Ask for help if you need support or instructions about  stopping the use of drugs. ?High blood pressure causes heart disease and increases the risk of stroke. Your blood pressure should be checked at least every 1-2 years. Ongoing high blood pressure should be treated

## 2021-12-16 NOTE — Progress Notes (Signed)
? ? ? ?Annual  Screening/Preventative Visit  ?& Comprehensive Evaluation & Examination ? ?Future Appointments  ?Date Time Provider Department  ?12/17/2021 11:00 AM Lucky Cowboy, MD [facility]  ?12/23/2022 11:00 AM Lucky Cowboy, MD [facility]  ? ?    ?     This very nice 41 y.o. single WM presents for a Screening /Preventative Visit & comprehensive evaluation and management of multiple medical co-morbidities.  Patient has been followed for HTN, HLD, Prediabetes and Vitamin D Deficiency. Patient has GERD controlled by his diet & his meds.  ? ? ?    HTN predates circa & has been well controlled on Nadolol, Olmesartan & HCTZ. Patient's BP has been controlled at home.  Today's BP: 122/76. Patient denies any cardiac symptoms as chest pain, palpitations, shortness of breath, dizziness or ankle swelling. ? ? ?    Patient's hyperlipidemia is controlled with diet and  Rosuvastatin. Patient denies myalgias or other medication SE's. Last lipids were at goal except slightly elevated Trig's : ? ?Lab Results  ?Component Value Date  ? CHOL 156 07/05/2021  ? HDL 47 07/05/2021  ? LDLCALC 81 07/05/2021  ? TRIG 180 (H) 07/05/2021  ? CHOLHDL 3.3 07/05/2021  ? ? ? ?    Patient has been monitored expectantly to abnormal glucose /Prediabetes and patient denies reactive hypoglycemic symptoms, visual blurring, diabetic polys or paresthesias. Last A1c was normal & at goal : ?  ?Lab Results  ?Component Value Date  ? HGBA1C 5.2 07/05/2021  ?  ? ? ?    Finally, patient has history of Vitamin D Deficiency ("28" /2012) and last vitamin D was slightly elevated & dose was tapered : ?  ?Lab Results  ?Component Value Date  ? VD25OH 116 (H) 07/05/2021  ? ? ? ?Current Outpatient Medications on File Prior to Visit  ?Medication Sig  ? ALPRAZolam 1 MG tablet TAKE 1/2 to 1 TABLET AT BEDTIME   ? amitriptyline 50 MG tablet Take  1 tablet  3 x /day  for Chronic Neck Pain  ? b complex vitamins capsule Take 1 capsule daily.  ? busPIRone  7.5 MG tablet  Take 1 tablet 3  times daily.  ? VITAMIN D 5000 UNITS  Take 1 tablet daily.   ? Escitalopram 20 MG tablet Take 1 tablet daily  ? hydrochlorothiazide 25 MG tablet Take  1 tablet  Daily  for BP & Fluid Retention  ? Magnesium 250 MG TABS Take 1 tablet daily.   ? nadolol 20 MG tablet Take 1 tablet daily. for blood pressure  ? olmesartan 20 MG tablet Take 1 tablet daily.  ? Omega-3 FISH OIL Take 300 mg  daily.  ? omeprazole 40 MG capsule TAKE 1 CAPSULE DAILY   ? rosuvastatin 20 MG tablet TAKE 1 TABLET DAILY   ? ? ? ?Allergies  ?Allergen Reactions  ? Sulfa Antibiotics   ? Trazodone And Nefazodone   ?  Severe dreams  ? ? ? ?Past Medical History:  ?Diagnosis Date  ? Anxiety   ? HLD (hyperlipidemia)   ? Insomnia   ? Lumbago   ? Tension headache   ? Unspecified vitamin D deficiency   ? ? ? ?Health Maintenance  ?Topic Date Due  ? COVID-19 Vaccine (1) Never done  ? INFLUENZA VACCINE  12/28/2021 (Originally 04/30/2021)  ? TETANUS/TDAP  10/22/2023  ? Hepatitis C Screening  Completed  ? HIV Screening  Completed  ? HPV VACCINES  Aged Out  ? ? ? ?Immunization History  ?Administered  Date(s) Administered  ? Influenza Inj Mdck Quad With Preservative 07/30/2017, 08/25/2018  ? Influenza, Seasonal, Injecte, Preservative Fre 09/07/2015  ? PPD Test 06/01/2014, 06/06/2015, 06/28/2016, 07/30/2017, 10/04/2019, 12/07/2020  ? Tdap 10/21/2013  ? ? ?No past surgical history on file. ? ? ?Family History  ?Problem Relation Age of Onset  ? Hypertension Mother   ? Diabetes Mother   ? Heart disease Father   ? Diabetes Father   ? Colon cancer Maternal Grandfather   ? Lung cancer Paternal Grandfather   ? Breast cancer Maternal Grandmother   ? Ovarian cancer Paternal Grandmother   ? Hyperlipidemia Father   ? ? ? ?Social History  ? ?Socioeconomic History  ? Marital status: Single  ?Occupational History  ? \  ? ?Tobacco Use  ? Smoking status: Former  ?  Packs/day: 1.00  ?  Years: 21.00  ?  Pack years: 21.00  ?  Types: Cigarettes  ?  Start date: 09/10/1998  ?   Quit date: 08/04/2019  ?  Years since quitting: 2.3  ? Smokeless tobacco: Never  ?Substance Use Topics  ? Alcohol use: Yes  ?  Alcohol/week: 0.0 standard drinks  ?  Comment: 6 beers per night  ? Drug use: No  ? ? ? ? ROS ?Constitutional: Denies fever, chills, weight loss/gain, headaches, insomnia,  night sweats or change in appetite. Does c/o fatigue. ?Eyes: Denies redness, blurred vision, diplopia, discharge, itchy or watery eyes.  ?ENT: Denies discharge, congestion, post nasal drip, epistaxis, sore throat, earache, hearing loss, dental pain, Tinnitus, Vertigo, Sinus pain or snoring.  ?Cardio: Denies chest pain, palpitations, irregular heartbeat, syncope, dyspnea, diaphoresis, orthopnea, PND, claudication or edema ?Respiratory: denies cough, dyspnea, DOE, pleurisy, hoarseness, laryngitis or wheezing.  ?Gastrointestinal: Denies dysphagia, heartburn, reflux, water brash, pain, cramps, nausea, vomiting, bloating, diarrhea, constipation, hematemesis, melena, hematochezia, jaundice or hemorrhoids ?Genitourinary: Denies dysuria, frequency, urgency, nocturia, hesitancy, discharge, hematuria or flank pain ?Musculoskeletal: Denies arthralgia, myalgia, stiffness, Jt. Swelling, pain, limp or strain/sprain. Denies Falls. ?Skin: Denies puritis, rash, hives, warts, acne, eczema or change in skin lesion ?Neuro: No weakness, tremor, incoordination, spasms, paresthesia or pain ?Psychiatric: Denies confusion, memory loss or sensory loss. Denies Depression. ?Endocrine: Denies change in weight, skin, hair change, nocturia, and paresthesia, diabetic polys, visual blurring or hyper / hypo glycemic episodes.  ?Heme/Lymph: No excessive bleeding, bruising or enlarged lymph nodes. ? ? ?Physical Exam ? ?BP 122/76   Pulse 86   Temp 97.9 ?F (36.6 ?C)   Resp 16   Ht 5' 10.5" (1.791 m)   Wt 183 lb 6.4 oz (83.2 kg)   SpO2 96%   BMI 25.94 kg/m?  ? ?General Appearance: Well nourished and well groomed and in no apparent distress. ? ?Eyes:  PERRLA, EOMs, conjunctiva no swelling or erythema, normal fundi and vessels. ?Sinuses: No frontal/maxillary tenderness ?ENT/Mouth: EACs patent / TMs  nl. Nares clear without erythema, swelling, mucoid exudates. Oral hygiene is good. No erythema, swelling, or exudate. Tongue normal, non-obstructing. Tonsils not swollen or erythematous. Hearing normal.  ?Neck: Supple, thyroid not palpable. No bruits, nodes or JVD. ?Respiratory: Respiratory effort normal.  BS equal and clear bilateral without rales, rhonci, wheezing or stridor. ?Cardio: Heart sounds are normal with regular rate and rhythm and no murmurs, rubs or gallops. Peripheral pulses are normal and equal bilaterally without edema. No aortic or femoral bruits. ?Chest: symmetric with normal excursions and percussion.  ?Abdomen: Soft, with Nl bowel sounds. Nontender, no guarding, rebound, hernias, masses, or organomegaly.  ?Lymphatics: Non tender without  lymphadenopathy.  ?Musculoskeletal: Full ROM all peripheral extremities, joint stability, 5/5 strength, and normal gait. ?Skin: Warm and dry without rashes, lesions, cyanosis, clubbing or  ecchymosis.  ?Neuro: Cranial nerves intact, reflexes equal bilaterally. Normal muscle tone, no cerebellar symptoms. Sensation intact.  ?Pysch: Alert and oriented X 3 with normal affect, insight and judgment appropriate.  ? ?Assessment and Plan ? ?1. Annual Preventative/Screening Exam  ? ? ?2. Essential hypertension ? ?- EKG 12-Lead ?- Urinalysis, Routine w reflex microscopic ?- Microalbumin / creatinine urine ratio ?- CBC with Differential/Platelet ?- COMPLETE METABOLIC PANEL WITH GFR ?- Magnesium ?- TSH ? ?3. Hyperlipidemia, mixed ? ?- EKG 12-Lead ?- Lipid panel ?- TSH ? ?4. Abnormal glucose ? ?- EKG 12-Lead ?- Hemoglobin A1c ?- Insulin, random ? ?5. Vitamin D deficiency ? ?- VITAMIN D 25 Hydroxy ? ?6. Gastroesophageal reflux disease without esophagitis ? ?- CBC with Differential/Platelet ? ?7. Screening for colorectal cancer ? ?-  POC Hemoccult Bld/Stl  ? ?8. Prostate cancer screening ? ?- PSA ? ?9. Screening-pulmonary TB ? ?- TB Skin Test ? ?10. Screening for ischemic heart disease ? ?- EKG 12-Lead ? ?11. FHx: heart disease ? ?- EKG 12-Le

## 2021-12-17 ENCOUNTER — Ambulatory Visit (INDEPENDENT_AMBULATORY_CARE_PROVIDER_SITE_OTHER): Payer: BC Managed Care – PPO | Admitting: Internal Medicine

## 2021-12-17 ENCOUNTER — Encounter: Payer: Self-pay | Admitting: Internal Medicine

## 2021-12-17 ENCOUNTER — Other Ambulatory Visit: Payer: Self-pay

## 2021-12-17 VITALS — BP 122/76 | HR 86 | Temp 97.9°F | Resp 16 | Ht 70.5 in | Wt 183.4 lb

## 2021-12-17 DIAGNOSIS — Z13220 Encounter for screening for lipoid disorders: Secondary | ICD-10-CM | POA: Diagnosis not present

## 2021-12-17 DIAGNOSIS — Z130 Encounter for screening for diseases of the blood and blood-forming organs and certain disorders involving the immune mechanism: Secondary | ICD-10-CM | POA: Diagnosis not present

## 2021-12-17 DIAGNOSIS — E559 Vitamin D deficiency, unspecified: Secondary | ICD-10-CM

## 2021-12-17 DIAGNOSIS — Z1329 Encounter for screening for other suspected endocrine disorder: Secondary | ICD-10-CM | POA: Diagnosis not present

## 2021-12-17 DIAGNOSIS — R350 Frequency of micturition: Secondary | ICD-10-CM | POA: Diagnosis not present

## 2021-12-17 DIAGNOSIS — Z1389 Encounter for screening for other disorder: Secondary | ICD-10-CM | POA: Diagnosis not present

## 2021-12-17 DIAGNOSIS — Z79899 Other long term (current) drug therapy: Secondary | ICD-10-CM

## 2021-12-17 DIAGNOSIS — Z87891 Personal history of nicotine dependence: Secondary | ICD-10-CM

## 2021-12-17 DIAGNOSIS — Z0000 Encounter for general adult medical examination without abnormal findings: Secondary | ICD-10-CM | POA: Diagnosis not present

## 2021-12-17 DIAGNOSIS — Z136 Encounter for screening for cardiovascular disorders: Secondary | ICD-10-CM

## 2021-12-17 DIAGNOSIS — Z125 Encounter for screening for malignant neoplasm of prostate: Secondary | ICD-10-CM | POA: Diagnosis not present

## 2021-12-17 DIAGNOSIS — Z131 Encounter for screening for diabetes mellitus: Secondary | ICD-10-CM | POA: Diagnosis not present

## 2021-12-17 DIAGNOSIS — Z8249 Family history of ischemic heart disease and other diseases of the circulatory system: Secondary | ICD-10-CM

## 2021-12-17 DIAGNOSIS — I10 Essential (primary) hypertension: Secondary | ICD-10-CM | POA: Diagnosis not present

## 2021-12-17 DIAGNOSIS — K219 Gastro-esophageal reflux disease without esophagitis: Secondary | ICD-10-CM

## 2021-12-17 DIAGNOSIS — R5383 Other fatigue: Secondary | ICD-10-CM

## 2021-12-17 DIAGNOSIS — Z0001 Encounter for general adult medical examination with abnormal findings: Secondary | ICD-10-CM

## 2021-12-17 DIAGNOSIS — Z111 Encounter for screening for respiratory tuberculosis: Secondary | ICD-10-CM

## 2021-12-17 DIAGNOSIS — E782 Mixed hyperlipidemia: Secondary | ICD-10-CM

## 2021-12-17 DIAGNOSIS — Z1211 Encounter for screening for malignant neoplasm of colon: Secondary | ICD-10-CM

## 2021-12-17 DIAGNOSIS — R7309 Other abnormal glucose: Secondary | ICD-10-CM

## 2021-12-17 DIAGNOSIS — N401 Enlarged prostate with lower urinary tract symptoms: Secondary | ICD-10-CM

## 2021-12-17 NOTE — Progress Notes (Signed)
? ? ? ?Annual  Screening/Preventative Visit  ?& Comprehensive Evaluation & Examination ? ?Future Appointments  ?Date Time Provider Department  ?12/17/2021 11:00 AM Lucky CowboyMcKeown, Jaquel Coomer, MD [facility]  ?12/23/2022 11:00 AM Lucky CowboyMcKeown, Mckinzee Spirito, MD [facility]  ? ?    ?     This very nice 41 y.o. single WM presents for a Screening /Preventative Visit & comprehensive evaluation and management of multiple medical co-morbidities.  Patient has been followed for HTN, HLD, Prediabetes and Vitamin D Deficiency. Patient has GERD controlled by his diet & his meds.  ? ? ?    HTN predates circa & has been well controlled on Nadolol, Olmesartan & HCTZ. Patient's BP has been controlled at home.  Today's BP is at goal -  122/76. Patient denies any cardiac symptoms as chest pain, palpitations, shortness of breath, dizziness or ankle swelling. ? ? ?    Patient's hyperlipidemia is controlled with diet and  Rosuvastatin. Patient denies myalgias or other medication SE's. Last lipids were at goal except slightly elevated Trig's : ? ?Lab Results  ?Component Value Date  ? CHOL 156 07/05/2021  ? HDL 47 07/05/2021  ? LDLCALC 81 07/05/2021  ? TRIG 180 (H) 07/05/2021  ? CHOLHDL 3.3 07/05/2021  ? ? ? ?    Patient has been monitored expectantly to abnormal glucose /Prediabetes and patient denies reactive hypoglycemic symptoms, visual blurring, diabetic polys or paresthesias. Last A1c was normal & at goal : ?  ?Lab Results  ?Component Value Date  ? HGBA1C 5.2 07/05/2021  ?  ? ? ?    Finally, patient has history of Vitamin D Deficiency ("28" /2012) and last vitamin D was slightly elevated & dose was tapered : ?  ?Lab Results  ?Component Value Date  ? VD25OH 116 (H) 07/05/2021  ? ? ? ?Current Outpatient Medications on File Prior to Visit  ?Medication Sig  ? ALPRAZolam 1 MG tablet TAKE 1/2 to 1 TABLET AT BEDTIME   ? amitriptyline 50 MG tablet Take  1 tablet  3 x /day  for Chronic Neck Pain  ? b complex vitamins capsule Take 1 capsule daily.  ? busPIRone  7.5  MG tablet Take 1 tablet 3  times daily.  ? VITAMIN D 5000 UNITS  Take 1 tablet daily.   ? Escitalopram 20 MG tablet Take 1 tablet daily  ? hydrochlorothiazide 25 MG tablet Take  1 tablet  Daily  for BP & Fluid Retention  ? Magnesium 250 MG TABS Take 1 tablet daily.   ? nadolol 20 MG tablet Take 1 tablet daily. for blood pressure  ? olmesartan 20 MG tablet Take 1 tablet daily.  ? Omega-3 FISH OIL Take 300 mg  daily.  ? omeprazole 40 MG capsule TAKE 1 CAPSULE DAILY   ? rosuvastatin 20 MG tablet TAKE 1 TABLET DAILY   ? ? ? ?Allergies  ?Allergen Reactions  ? Sulfa Antibiotics   ? Trazodone And Nefazodone   ?  Severe dreams  ? ? ? ?Past Medical History:  ?Diagnosis Date  ? Anxiety   ? HLD (hyperlipidemia)   ? Insomnia   ? Lumbago   ? Tension headache   ? Unspecified vitamin D deficiency   ? ? ? ?Health Maintenance  ?Topic Date Due  ? COVID-19 Vaccine (1) Never done  ? INFLUENZA VACCINE  12/28/2021 (Originally 04/30/2021)  ? TETANUS/TDAP  10/22/2023  ? Hepatitis C Screening  Completed  ? HIV Screening  Completed  ? HPV VACCINES  Aged Out  ? ? ? ?  Immunization History  ?Administered Date(s) Administered  ? Influenza Inj Mdck Quad With Preservative 07/30/2017, 08/25/2018  ? Influenza, Seasonal, Injecte, Preservative Fre 09/07/2015  ? PPD Test 06/01/2014, 06/06/2015, 06/28/2016, 07/30/2017, 10/04/2019, 12/07/2020  ? Tdap 10/21/2013  ? ? ?No past surgical history on file. ? ? ?Family History  ?Problem Relation Age of Onset  ? Hypertension Mother   ? Diabetes Mother   ? Heart disease Father   ? Diabetes Father   ? Colon cancer Maternal Grandfather   ? Lung cancer Paternal Grandfather   ? Breast cancer Maternal Grandmother   ? Ovarian cancer Paternal Grandmother   ? Hyperlipidemia Father   ? ? ? ?Social History  ? ?Socioeconomic History  ? Marital status: Single  ?Occupational History  ? \  ? ?Tobacco Use  ? Smoking status: Former  ?  Packs/day: 1.00  ?  Years: 21.00  ?  Pack years: 21.00  ?  Types: Cigarettes  ?  Start date:  09/10/1998  ?  Quit date: 08/04/2019  ?  Years since quitting: 2.3  ? Smokeless tobacco: Never  ?Substance Use Topics  ? Alcohol use: Yes  ?  Alcohol/week: 0.0 standard drinks  ?  Comment: 6 beers per night  ? Drug use: No  ? ? ? ? ROS ?Constitutional: Denies fever, chills, weight loss/gain, headaches, insomnia,  night sweats or change in appetite. Does c/o fatigue. ?Eyes: Denies redness, blurred vision, diplopia, discharge, itchy or watery eyes.  ?ENT: Denies discharge, congestion, post nasal drip, epistaxis, sore throat, earache, hearing loss, dental pain, Tinnitus, Vertigo, Sinus pain or snoring.  ?Cardio: Denies chest pain, palpitations, irregular heartbeat, syncope, dyspnea, diaphoresis, orthopnea, PND, claudication or edema ?Respiratory: denies cough, dyspnea, DOE, pleurisy, hoarseness, laryngitis or wheezing.  ?Gastrointestinal: Denies dysphagia, heartburn, reflux, water brash, pain, cramps, nausea, vomiting, bloating, diarrhea, constipation, hematemesis, melena, hematochezia, jaundice or hemorrhoids ?Genitourinary: Denies dysuria, frequency, urgency, nocturia, hesitancy, discharge, hematuria or flank pain ?Musculoskeletal: Denies arthralgia, myalgia, stiffness, Jt. Swelling, pain, limp or strain/sprain. Denies Falls. ?Skin: Denies puritis, rash, hives, warts, acne, eczema or change in skin lesion ?Neuro: No weakness, tremor, incoordination, spasms, paresthesia or pain ?Psychiatric: Denies confusion, memory loss or sensory loss. Denies Depression. ?Endocrine: Denies change in weight, skin, hair change, nocturia, and paresthesia, diabetic polys, visual blurring or hyper / hypo glycemic episodes.  ?Heme/Lymph: No excessive bleeding, bruising or enlarged lymph nodes. ? ? ?Physical Exam ? ?BP 122/76   Pulse 86   Temp 97.9 ?F (36.6 ?C)   Resp 16   Ht 5' 10.5" (1.791 m)   Wt 183 lb 6.4 oz (83.2 kg)   SpO2 96%   BMI 25.94 kg/m?  ? ?General Appearance: Well nourished and well groomed and in no apparent  distress. ? ?Eyes: PERRLA, EOMs, conjunctiva no swelling or erythema, normal fundi and vessels. ?Sinuses: No frontal/maxillary tenderness ?ENT/Mouth: EACs patent / TMs  nl. Nares clear without erythema, swelling, mucoid exudates. Oral hygiene is good. No erythema, swelling, or exudate. Tongue normal, non-obstructing. Tonsils not swollen or erythematous. Hearing normal.  ?Neck: Supple, thyroid not palpable. No bruits, nodes or JVD. ?Respiratory: Respiratory effort normal.  BS equal and clear bilateral without rales, rhonci, wheezing or stridor. ?Cardio: Heart sounds are normal with regular rate and rhythm and no murmurs, rubs or gallops. Peripheral pulses are normal and equal bilaterally without edema. No aortic or femoral bruits. ?Chest: symmetric with normal excursions and percussion.  ?Abdomen: Soft, with Nl bowel sounds. Nontender, no guarding, rebound, hernias, masses, or organomegaly.  ?  Lymphatics: Non tender without lymphadenopathy.  ?Musculoskeletal: Full ROM all peripheral extremities, joint stability, 5/5 strength, and normal gait. ?Skin: Warm and dry without rashes, lesions, cyanosis, clubbing or  ecchymosis.  ?Neuro: Cranial nerves intact, reflexes equal bilaterally. Normal muscle tone, no cerebellar symptoms. Sensation intact.  ?Pysch: Alert and oriented X 3 with normal affect, insight and judgment appropriate.  ? ?Assessment and Plan ? ?1. Annual Preventative/Screening Exam  ? ? ?2. Essential hypertension ? ?- EKG 12-Lead ?- Urinalysis, Routine w reflex microscopic ?- Microalbumin / creatinine urine ratio ?- CBC with Differential/Platelet ?- COMPLETE METABOLIC PANEL WITH GFR ?- Magnesium ?- TSH ? ?3. Hyperlipidemia, mixed ? ?- EKG 12-Lead ?- Lipid panel ?- TSH ? ?4. Abnormal glucose ? ?- EKG 12-Lead ?- Hemoglobin A1c ?- Insulin, random ? ?5. Vitamin D deficiency ? ?- VITAMIN D 25 Hydroxy ? ?6. Gastroesophageal reflux disease without esophagitis ? ?- CBC with Differential/Platelet ? ?7. Screening for  colorectal cancer ? ?- POC Hemoccult Bld/Stl  ? ?8. Prostate cancer screening ? ?- PSA ? ?9. Screening-pulmonary TB ? ?- TB Skin Test ? ?10. Screening for ischemic heart disease ? ?- EKG 12-Lead ? ?11. FHx: heart disease ?

## 2021-12-18 ENCOUNTER — Other Ambulatory Visit: Payer: Self-pay | Admitting: Nurse Practitioner

## 2021-12-18 DIAGNOSIS — F419 Anxiety disorder, unspecified: Secondary | ICD-10-CM

## 2021-12-18 LAB — COMPLETE METABOLIC PANEL WITH GFR
AG Ratio: 1.7 (calc) (ref 1.0–2.5)
ALT: 22 U/L (ref 9–46)
AST: 16 U/L (ref 10–40)
Albumin: 5 g/dL (ref 3.6–5.1)
Alkaline phosphatase (APISO): 56 U/L (ref 36–130)
BUN: 11 mg/dL (ref 7–25)
CO2: 33 mmol/L — ABNORMAL HIGH (ref 20–32)
Calcium: 10.6 mg/dL — ABNORMAL HIGH (ref 8.6–10.3)
Chloride: 100 mmol/L (ref 98–110)
Creat: 0.97 mg/dL (ref 0.60–1.29)
Globulin: 3 g/dL (calc) (ref 1.9–3.7)
Glucose, Bld: 91 mg/dL (ref 65–99)
Potassium: 4.2 mmol/L (ref 3.5–5.3)
Sodium: 139 mmol/L (ref 135–146)
Total Bilirubin: 0.7 mg/dL (ref 0.2–1.2)
Total Protein: 8 g/dL (ref 6.1–8.1)
eGFR: 101 mL/min/{1.73_m2} (ref >=60)

## 2021-12-18 LAB — CBC WITH DIFFERENTIAL/PLATELET
Absolute Monocytes: 706 cells/uL (ref 200–950)
Basophils Absolute: 59 cells/uL (ref 0–200)
Basophils Relative: 0.7 %
Eosinophils Absolute: 319 cells/uL (ref 15–500)
Eosinophils Relative: 3.8 %
HCT: 47.1 % (ref 38.5–50.0)
Hemoglobin: 16.2 g/dL (ref 13.2–17.1)
Lymphs Abs: 2142 cells/uL (ref 850–3900)
MCH: 30.4 pg (ref 27.0–33.0)
MCHC: 34.4 g/dL (ref 32.0–36.0)
MCV: 88.4 fL (ref 80.0–100.0)
MPV: 9.8 fL (ref 7.5–12.5)
Monocytes Relative: 8.4 %
Neutro Abs: 5174 cells/uL (ref 1500–7800)
Neutrophils Relative %: 61.6 %
Platelets: 254 10*3/uL (ref 140–400)
RBC: 5.33 10*6/uL (ref 4.20–5.80)
RDW: 12.2 % (ref 11.0–15.0)
Total Lymphocyte: 25.5 %
WBC: 8.4 10*3/uL (ref 3.8–10.8)

## 2021-12-18 LAB — LIPID PANEL
Cholesterol: 173 mg/dL (ref <200)
HDL: 51 mg/dL (ref >=40)
LDL Cholesterol (Calc): 93 mg/dL (calc)
Non-HDL Cholesterol (Calc): 122 mg/dL (calc) (ref <130)
Total CHOL/HDL Ratio: 3.4 (calc) (ref <5.0)
Triglycerides: 194 mg/dL — ABNORMAL HIGH (ref <150)

## 2021-12-18 LAB — URINALYSIS, ROUTINE W REFLEX MICROSCOPIC
Bilirubin Urine: NEGATIVE
Glucose, UA: NEGATIVE
Hgb urine dipstick: NEGATIVE
Ketones, ur: NEGATIVE
Leukocytes,Ua: NEGATIVE
Nitrite: NEGATIVE
Protein, ur: NEGATIVE
Specific Gravity, Urine: 1.005 (ref 1.001–1.035)
pH: 7.5 (ref 5.0–8.0)

## 2021-12-18 LAB — HEMOGLOBIN A1C
Hgb A1c MFr Bld: 5.3 % of total Hgb (ref <5.7)
Mean Plasma Glucose: 105 mg/dL
eAG (mmol/L): 5.8 mmol/L

## 2021-12-18 LAB — IRON, TOTAL/TOTAL IRON BINDING CAP
%SAT: 45 % (calc) (ref 20–48)
Iron: 145 ug/dL (ref 50–180)
TIBC: 323 mcg/dL (calc) (ref 250–425)

## 2021-12-18 LAB — TESTOSTERONE: Testosterone: 102 ng/dL — ABNORMAL LOW (ref 250–827)

## 2021-12-18 LAB — MICROALBUMIN / CREATININE URINE RATIO
Creatinine, Urine: 20 mg/dL (ref 20–320)
Microalb, Ur: <0.2 mg/dL

## 2021-12-18 LAB — VITAMIN B12: Vitamin B-12: 533 pg/mL (ref 200–1100)

## 2021-12-18 LAB — TSH: TSH: 1.06 mIU/L (ref 0.40–4.50)

## 2021-12-18 LAB — VITAMIN D 25 HYDROXY (VIT D DEFICIENCY, FRACTURES): Vit D, 25-Hydroxy: 92 ng/mL (ref 30–100)

## 2021-12-18 LAB — MAGNESIUM: Magnesium: 2.4 mg/dL (ref 1.5–2.5)

## 2021-12-18 LAB — INSULIN, RANDOM: Insulin: 14.9 u[IU]/mL

## 2021-12-18 LAB — PSA: PSA: 0.35 ng/mL (ref <=4.00)

## 2021-12-18 NOTE — Progress Notes (Signed)
<><><><><><><><><><><><><><><><><><><><><><><><><><><><><><><><><> ?<><><><><><><><><><><><><><><><><><><><><><><><><><><><><><><><><> ?-   Test results slightly outside the reference range are not unusual. ?If there is anything important, I will review this with you,  ?otherwise it is considered normal test values.  ?If you have further questions,  ?please do not hesitate to contact me at the office or via My Chart.  ?<><><><><><><><><><><><><><><><><><><><><><><><><><><><><><><><><> ?<><><><><><><><><><><><><><><><><><><><><><><><><><><><><><><><><> ? ?-  Testosterone level is low - which could be a lab error .  ? ?-  Taking Zinc 50 mg may help raise Testosterone levels naturally  ? ?- Exercising 30 to 60 minutes  twice ( 2 x)  /day will help raise testosterone levels.  ? ?-  Heavy alcohol use and / or using Marijuana will lower Testosterone levels.  ? ?- Will repeat Testosterone level at 6/22 office visit  ?<><><><><><><><><><><><><><><><><><><><><><><><><><><><><><><><><> ?<><><><><><><><><><><><><><><><><><><><><><><><><><><><><><><><><> ? ?- Total Chol = 173   &   LDL Chol = 93   - Both    Excellent  ? ?- Very low risk for Heart Attack  / Stroke ?============================================================ ?============================================================ ? ?-  Triglycerides (   194    ) or fats in blood are too high  ?(goal is less than 150)   ? ?- Recommend avoid fried & greasy foods,  sweets / candy,  ? ?- Avoid white rice  ?(brown or wild rice or Quinoa is OK),  ? ?- Avoid white potatoes  ?(sweet potatoes are OK)  ? ?- Avoid anything made from white flour  ?- bagels, doughnuts, rolls, buns, biscuits, white and  ? ?wheat breads, pizza crust and traditional  ?pasta made of white flour & egg white ? ?- (vegetarian pasta or spinach or wheat pasta is OK).   ? ?- Multi-grain bread is OK - like multi-grain flat bread or ? sandwich thins.  ? ?- Avoid alcohol in excess.  ? ?- Exercise is also  important. ?<><><><><><><><><><><><><><><><><><><><><><><><><><><><><><><><><> ?<><><><><><><><><><><><><><><><><><><><><><><><><><><><><><><><><> ? ?- Iron Levels & Vitamin B12 levels are Normal  ?<><><><><><><><><><><><><><><><><><><><><><><><><><><><><><><><><> ?<><><><><><><><><><><><><><><><><><><><><><><><><><><><><><><><><> ? ?- PSA - is very Low   - Great   ! ?<><><><><><><><><><><><><><><><><><><><><><><><><><><><><><><><><> ?<><><><><><><><><><><><><><><><><><><><><><><><><><><><><><><><><> ? ?- A1c - Normal - no Diabetes   - Great    ! ?<><><><><><><><><><><><><><><><><><><><><><><><><><><><><><><><><> ?<><><><><><><><><><><><><><><><><><><><><><><><><><><><><><><><><> ? ?- All Else - CBC - Kidneys - Electrolytes - Liver - Magnesium & Thyroid   ? ?- all  Normal / OK ?<><><><><><><><><><><><><><><><><><><><><><><><><><><><><><><><><> ?<><><><><><><><><><><><><><><><><><><><><><><><><><><><><><><><><> ? ?- Vitamin D = 92   - Excellent   - Please keep dose same. ?<><><><><><><><><><><><><><><><><><><><><><><><><><><><><><><><><> ?<><><><><><><><><><><><><><><><><><><><><><><><><><><><><><><><><> ? ?- All Else - CBC - Kidneys - Electrolytes - Liver - Magnesium & Thyroid   ? ?- all  Normal / OK ?===========================================================  ? ? ? ? ? ? ? ? ? ? ? ? ? ? ? ? ? ? ? ?

## 2021-12-20 ENCOUNTER — Other Ambulatory Visit: Payer: Self-pay | Admitting: Nurse Practitioner

## 2021-12-20 DIAGNOSIS — F419 Anxiety disorder, unspecified: Secondary | ICD-10-CM

## 2021-12-20 DIAGNOSIS — K219 Gastro-esophageal reflux disease without esophagitis: Secondary | ICD-10-CM

## 2021-12-20 MED ORDER — ALPRAZOLAM 1 MG PO TABS: ORAL_TABLET | ORAL | 0 refills | Status: DC

## 2022-01-29 ENCOUNTER — Other Ambulatory Visit: Payer: Self-pay | Admitting: Nurse Practitioner

## 2022-01-29 DIAGNOSIS — E782 Mixed hyperlipidemia: Secondary | ICD-10-CM

## 2022-01-30 ENCOUNTER — Other Ambulatory Visit: Payer: Self-pay | Admitting: Nurse Practitioner

## 2022-01-30 DIAGNOSIS — F419 Anxiety disorder, unspecified: Secondary | ICD-10-CM

## 2022-01-30 MED ORDER — ALPRAZOLAM 1 MG PO TABS: ORAL_TABLET | ORAL | 0 refills | Status: DC

## 2022-01-30 NOTE — Progress Notes (Signed)
PDMP is reviewed and appropriate  

## 2022-03-10 ENCOUNTER — Other Ambulatory Visit: Payer: Self-pay | Admitting: Nurse Practitioner

## 2022-03-10 DIAGNOSIS — K219 Gastro-esophageal reflux disease without esophagitis: Secondary | ICD-10-CM

## 2022-03-11 ENCOUNTER — Other Ambulatory Visit: Payer: Self-pay | Admitting: Nurse Practitioner

## 2022-03-11 DIAGNOSIS — F419 Anxiety disorder, unspecified: Secondary | ICD-10-CM

## 2022-03-11 MED ORDER — ALPRAZOLAM 1 MG PO TABS: ORAL_TABLET | ORAL | 0 refills | Status: DC

## 2022-03-21 ENCOUNTER — Encounter: Payer: Self-pay | Admitting: Nurse Practitioner

## 2022-03-21 ENCOUNTER — Ambulatory Visit (INDEPENDENT_AMBULATORY_CARE_PROVIDER_SITE_OTHER): Payer: BC Managed Care – PPO | Admitting: Nurse Practitioner

## 2022-03-21 VITALS — BP 114/88 | HR 84 | Temp 98.2°F | Wt 182.0 lb

## 2022-03-21 DIAGNOSIS — I10 Essential (primary) hypertension: Secondary | ICD-10-CM | POA: Diagnosis not present

## 2022-03-21 DIAGNOSIS — K219 Gastro-esophageal reflux disease without esophagitis: Secondary | ICD-10-CM | POA: Diagnosis not present

## 2022-03-21 DIAGNOSIS — F32A Depression, unspecified: Secondary | ICD-10-CM

## 2022-03-21 DIAGNOSIS — F17200 Nicotine dependence, unspecified, uncomplicated: Secondary | ICD-10-CM

## 2022-03-21 DIAGNOSIS — Z79899 Other long term (current) drug therapy: Secondary | ICD-10-CM

## 2022-03-21 DIAGNOSIS — L237 Allergic contact dermatitis due to plants, except food: Secondary | ICD-10-CM

## 2022-03-21 DIAGNOSIS — F419 Anxiety disorder, unspecified: Secondary | ICD-10-CM

## 2022-03-21 DIAGNOSIS — E782 Mixed hyperlipidemia: Secondary | ICD-10-CM | POA: Diagnosis not present

## 2022-03-21 MED ORDER — DEXAMETHASONE SODIUM PHOSPHATE 10 MG/ML IJ SOLN: 10.0000 mg | Freq: Once | INTRAMUSCULAR | Status: DC

## 2022-03-21 NOTE — Patient Instructions (Signed)
Poison Ivy Dermatitis Poison ivy dermatitis is redness and soreness of the skin caused by chemicals in the leaves of the poison ivy plant. You may have very bad itching, swelling, a rash, and blisters. What are the causes? Touching a poison ivy plant. Touching something that has the chemical on it. This may include animals or objects that have come in contact with the plant. What increases the risk? Going outdoors often in wooded or marshy areas. Going outdoors without wearing protective clothing, such as closed shoes, long pants, and a long-sleeved shirt. What are the signs or symptoms?  Skin redness. Very bad itching. A rash that often includes bumps and blisters. The rash usually appears 48 hours after exposure, if you have been exposed before. If this is the first time you have been exposed, the rash may not appear until a week after exposure. Swelling. This may occur if the reaction is very bad. Symptoms usually last for 1-2 weeks. The first time you develop this condition, symptoms may last 3-4 weeks. How is this treated? This condition may be treated with: Hydrocortisone cream or calamine lotion to relieve itching. Oatmeal baths to soothe the skin. Medicines, such as over-the-counter antihistamine tablets. Oral steroid medicine for more severe reactions. Follow these instructions at home: Medicines Take or apply over-the-counter and prescription medicines only as told by your doctor. Use hydrocortisone cream or calamine lotion as needed to help with itching. General instructions Do not scratch or rub your skin. Put a cold, wet cloth (cold compress) on the affected areas or take baths in cool water. This will help with itching. Avoid hot baths and showers. Take oatmeal baths as needed. Use colloidal oatmeal. You can get this at a pharmacy or grocery store. Follow the instructions on the package. While you have the rash, wash your clothes right after you wear them. Keep all  follow-up visits as told by your health care provider. This is important. How is this prevented?  Know what poison ivy looks like, so you can avoid it. This plant has three leaves with flowering branches on a single stem. The leaves are glossy. The leaves have uneven edges that come to a point at the front. If you touch poison ivy, wash your skin with soap and water right away. Be sure to wash under your fingernails. When hiking or camping, wear long pants, a long-sleeved shirt, tall socks, and hiking boots. You can also use a lotion on your skin that helps to prevent contact with poison ivy. If you think that your clothes or outdoor gear came in contact with poison ivy, rinse them off with a garden hose before you bring them inside your house. When doing yard work or gardening, wear gloves, long sleeves, long pants, and boots. Wash your garden tools and gloves if they come in contact with poison ivy. If you think that your pet has come into contact with poison ivy, wash him or her with pet shampoo and water. Make sure to wear gloves while washing your pet. Contact a doctor if: You have open sores in the rash area. You have more redness, swelling, or pain in the rash area. You have redness that spreads beyond the rash area. You have fluid, blood, or pus coming from the rash area. You have a fever. You have a rash over a large area of your body. You have a rash on your eyes, mouth, or genitals. Your rash does not get better after a few weeks. Get help right away   if: Your face swells or your eyes swell shut. You have trouble breathing. You have trouble swallowing. These symptoms may be an emergency. Do not wait to see if the symptoms will go away. Get medical help right away. Call your local emergency services (911 in the [place] Do not drive yourself to the hospital. Summary Poison ivy dermatitis is redness and soreness of the skin caused by chemicals in the leaves of the poison ivy  plant. You may have skin redness, very bad itching, swelling, and a rash. Do not scratch or rub your skin. Take or apply over-the-counter and prescription medicines only as told by your doctor. This information is not intended to replace advice given to you by your health care provider. Make sure you discuss any questions you have with your health care provider. Document Revised: 07/02/2021 Document Reviewed: 07/02/2021 Elsevier Patient Education  2023 [employer] Inc[employer]  

## 2022-03-21 NOTE — Progress Notes (Signed)
FOLLOW UP  Assessment and Plan:   1. Essential hypertension Controlled  Start to taper off of Nadolol by taking 10 mg for 1-2 weeks, then 10 mg every other day for 1 week. Continue to monitor BP. Monitor BP at home-Call if greater than 130/80.  Continue medications; Hydrodiuril, Benicar. Discussed DASH (Dietary Approaches to Stop Hypertension) DASH diet is lower in sodium than a typical American diet. Cut back on foods that are high in saturated fat, cholesterol, and trans fats. Eat more whole-grain foods, fish, poultry, and nuts Remain active and exercise as tolerated daily.    2. Gastroesophageal reflux disease without esophagitis Controlled. Continue Prilosec. Avoid triggers. Avoid laying down after eating.  3. Mixed hyperlipidemia Uncontrolled - Triglycerides elevated. Continue medications; Rosuvastatin Discussed lifestyle modifications. Recommended diet heavy in fruits and veggies, omega 3's. Decrease consumption of animal meats, cheeses, and dairy products. Remain active and exercise as tolerated. Continue to monitor.   4. Tobacco use disorder Quit 2.5 years ago. Resolved.  5. Anxiety and depression Controlled. Continue Xanax PRN Continue Buspar, Lexapro. Lifestyle modifications. Goods sleep hygiene.  6. Medication management All medications discussed and reviewed in full. All questions and concerns regarding medications addressed.    7.  Poison Ivy Dexamethasone injection administered. Advised Oral Antihistamine PRN Refrain from scratching to prevent spreading or increased risk for infection. Continue to monitor  -dexamethasone (DECADRON) injection 10 mg   Continue diet and meds as discussed. Further disposition pending results of labs. Discussed med's effects and SE's.   Over 30 minutes of exam, counseling, chart review, and critical decision making was performed.   Future Appointments  Date Time Provider Department Center  06/27/2022  3:30 PM  Lucky Cowboy, MD [facility] None  12/23/2022 11:00 AM Lucky Cowboy, MD [facility] None    ----------------------------------------------------------------------------------------------------------------------  HPI 41 y.o. male  presents for 3 month follow up on hypertension, cholesterol, diabetes, weight and vitamin D deficiency.   He reports feeling fatigued and associates this with the amount of blood pressure medication he is taking.  He is currently on Hydrodiuril, Corgard and Benicar.  He reports taking Nadolol for over 10 years.    Reports breakout of poison ivy along his bilateral wrists and ankles.  He has purchased a new home and has been working pulling weed in the yard.  He has used calamine lotion.   BMI is Body mass index is 25.75 kg/m., he has not been working on diet and exercise. Wt Readings from Last 3 Encounters:  03/21/22 182 lb (82.6 kg)  12/17/21 183 lb 6.4 oz (83.2 kg)  08/29/21 177 lb 6.4 oz (80.5 kg)    His blood pressure has been controlled at home, today their BP is BP: 114/88  He does not workout. He denies chest pain, shortness of breath, dizziness.   He is on cholesterol medication Rosuvastatin and denies myalgias. His cholesterol is not at goal. The cholesterol last visit was:   Lab Results  Component Value Date   CHOL 173 12/17/2021   HDL 51 12/17/2021   LDLCALC 93 12/17/2021   TRIG 194 (H) 12/17/2021   CHOLHDL 3.4 12/17/2021    He has not been working on diet and exercise for prediabetes, and denies polydipsia and polyuria. Last A1C in the office was:  Lab Results  Component Value Date   HGBA1C 5.3 12/17/2021   Patient is on Vitamin D supplement.   Lab Results  Component Value Date   VD25OH 92 12/17/2021  Current Medications:  Current Outpatient Medications on File Prior to Visit  Medication Sig   ALPRAZolam (XANAX) 1 MG tablet TAKE ONE-HALF TO ONE TABLET BY MOUTH AT BEDTIME ONLY IF NEEDED FOR SLEEP AND LIMIT TO 5 DAYS/  WEEK TO AVOID ADDICTION AND DEMENTA   amitriptyline (ELAVIL) 50 MG tablet Take  1 tablet  3 x /day  for Chronic Neck Pain (Patient taking differently: Take  1 tablet 2 x /day  for Chronic Neck Pain)   b complex vitamins capsule Take 1 capsule by mouth daily.   busPIRone (BUSPAR) 7.5 MG tablet Take 1 tablet (7.5 mg total) by mouth 3 (three) times daily.   Cholecalciferol (VITAMIN D-3) 5000 UNITS TABS Take 2 tablets by mouth daily. Taking 10,000 units   escitalopram (LEXAPRO) 20 MG tablet Take 1 tablet by mouth once daily   hydrochlorothiazide (HYDRODIURIL) 25 MG tablet Take  1 tablet  Daily  for BP & Fluid Retention   Magnesium 250 MG TABS Take 1 tablet by mouth daily.    nadolol (CORGARD) 20 MG tablet Take 1 tablet (20 mg total) by mouth daily. for blood pressure   olmesartan (BENICAR) 20 MG tablet Take 1 tablet (20 mg total) by mouth daily.   Omega-3 Fatty Acids (FISH OIL PO) Take 300 mg by mouth daily.   omeprazole (PRILOSEC) 40 MG capsule TAKE 1 CAPSULE BY MOUTH ONCE DAILY TO PREVENT HEARTBURN AND INDIGESTION   rosuvastatin (CRESTOR) 20 MG tablet TAKE 1 TABLET BY MOUTH ONCE DAILY FOR CHOLESTEROL   No current facility-administered medications on file prior to visit.     Allergies:  Allergies  Allergen Reactions   Sulfa Antibiotics    Trazodone And Nefazodone     Severe dreams     Medical History:  Past Medical History:  Diagnosis Date   Anxiety    HLD (hyperlipidemia)    Insomnia    Lumbago    Tension headache    Unspecified vitamin D deficiency    Family history- Reviewed and unchanged Social history- Reviewed and unchanged   Review of Systems:  ROS    Physical Exam: BP 114/88   Pulse 84   Temp 98.2 F (36.8 C)   Wt 182 lb (82.6 kg)   SpO2 97%   BMI 25.75 kg/m  Wt Readings from Last 3 Encounters:  03/21/22 182 lb (82.6 kg)  12/17/21 183 lb 6.4 oz (83.2 kg)  08/29/21 177 lb 6.4 oz (80.5 kg)   General Appearance: Well nourished, in no apparent  distress. Eyes: PERRLA, EOMs, conjunctiva no swelling or erythema Sinuses: No Frontal/maxillary tenderness ENT/Mouth: Ext aud canals clear, TMs without erythema, bulging. No erythema, swelling, or exudate on post pharynx.  Tonsils not swollen or erythematous. Hearing normal.  Neck: Supple, thyroid normal.  Respiratory: Respiratory effort normal, BS equal bilaterally without rales, rhonchi, wheezing or stridor.  Cardio: RRR with no MRGs. Brisk peripheral pulses without edema.  Abdomen: Soft, + BS.  Non tender, no guarding, rebound, hernias, masses. Lymphatics: Non tender without lymphadenopathy.  Musculoskeletal: Full ROM, 5/5 strength, Normal gait Skin: Warm, dry without rashes, lesions, ecchymosis.  Neuro: Cranial nerves intact. No cerebellar symptoms.  Psych: Awake and oriented X 3, normal affect, Insight and Judgment appropriate.    Adela Glimpse, NP 3:55 PM [facility]

## 2022-04-13 ENCOUNTER — Other Ambulatory Visit: Payer: Self-pay | Admitting: Adult Health

## 2022-04-13 DIAGNOSIS — I10 Essential (primary) hypertension: Secondary | ICD-10-CM

## 2022-04-22 ENCOUNTER — Other Ambulatory Visit: Payer: Self-pay | Admitting: Nurse Practitioner

## 2022-04-22 DIAGNOSIS — F419 Anxiety disorder, unspecified: Secondary | ICD-10-CM

## 2022-04-22 MED ORDER — ALPRAZOLAM 1 MG PO TABS: ORAL_TABLET | ORAL | 0 refills | Status: DC

## 2022-04-29 ENCOUNTER — Other Ambulatory Visit: Payer: Self-pay | Admitting: Nurse Practitioner

## 2022-04-29 DIAGNOSIS — E782 Mixed hyperlipidemia: Secondary | ICD-10-CM

## 2022-04-30 ENCOUNTER — Other Ambulatory Visit: Payer: Self-pay | Admitting: Internal Medicine

## 2022-04-30 DIAGNOSIS — I10 Essential (primary) hypertension: Secondary | ICD-10-CM

## 2022-05-10 ENCOUNTER — Ambulatory Visit (INDEPENDENT_AMBULATORY_CARE_PROVIDER_SITE_OTHER): Payer: BC Managed Care – PPO | Admitting: Nurse Practitioner

## 2022-05-10 VITALS — BP 118/90 | HR 86 | Temp 97.9°F | Ht 70.5 in | Wt 183.0 lb

## 2022-05-10 DIAGNOSIS — R051 Acute cough: Secondary | ICD-10-CM | POA: Diagnosis not present

## 2022-05-10 DIAGNOSIS — R509 Fever, unspecified: Secondary | ICD-10-CM | POA: Diagnosis not present

## 2022-05-10 DIAGNOSIS — J0110 Acute frontal sinusitis, unspecified: Secondary | ICD-10-CM | POA: Diagnosis not present

## 2022-05-10 DIAGNOSIS — R5383 Other fatigue: Secondary | ICD-10-CM

## 2022-05-10 DIAGNOSIS — R519 Headache, unspecified: Secondary | ICD-10-CM

## 2022-05-10 MED ORDER — SUMATRIPTAN SUCCINATE 50 MG PO TABS
50.0000 mg | ORAL_TABLET | Freq: Once | ORAL | 2 refills | Status: DC | PRN
Start: 2022-05-10 — End: 2023-03-12

## 2022-05-10 NOTE — Patient Instructions (Signed)
Sumatriptan Tablets What is this medication? SUMATRIPTAN (soo ma TRIP tan) treats migraines. It works by blocking pain signals and narrowing blood vessels in the brain. It belongs to a group of medications called triptans. It is not used to prevent migraines. This medicine may be used for other purposes; ask your health care provider or pharmacist if you have questions. COMMON BRAND NAME(S): Imitrex, Migraine Pack What should I tell my care team before I take this medication? They need to know if you have any of these conditions: Cigarette smoker Circulation problems in fingers and toes Heart disease High blood pressure High blood sugar (diabetes) High cholesterol History of irregular heartbeat History of stroke Kidney disease Liver disease Stomach or intestine problems An unusual or allergic reaction to sumatriptan, other medications, foods, dyes, or preservatives Pregnant or trying to get pregnant Breast-feeding How should I use this medication? Take this medication by mouth with a glass of water. Follow the directions on the prescription label. Do not take it more often than directed. Talk to your care team regarding the use of this medication in children. Special care may be needed. Overdosage: If you think you have taken too much of this medicine contact a poison control center or emergency room at once. NOTE: This medicine is only for you. Do not share this medicine with others. What if I miss a dose? This does not apply. This medication is not for regular use. What may interact with this medication? Do not take this medication with any of the following: Certain medications for migraine headache like almotriptan, eletriptan, frovatriptan, naratriptan, rizatriptan, sumatriptan, zolmitriptan Ergot alkaloids like dihydroergotamine, ergonovine, ergotamine, methylergonovine MAOIs like Carbex, Eldepryl, Marplan, Nardil, and Parnate This medication may also interact with the  following: Certain medications for depression, anxiety, or psychotic disorders This list may not describe all possible interactions. Give your health care provider a list of all the medicines, herbs, non-prescription drugs, or dietary supplements you use. Also tell them if you smoke, drink alcohol, or use illegal drugs. Some items may interact with your medicine. What should I watch for while using this medication? Visit your care team for regular checks on your progress. Tell your care team if your symptoms do not start to get better or if they get worse. You may get drowsy or dizzy. Do not drive, use machinery, or do anything that needs mental alertness until you know how this medication affects you. Do not stand up or sit up quickly, especially if you are an older patient. This reduces the risk of dizzy or fainting spells. Alcohol may interfere with the effect of this medication. Tell your care team right away if you have any change in your eyesight. If you take migraine medications for 10 or more days a month, your migraines may get worse. Keep a diary of headache days and medication use. Contact your care team if your migraine attacks occur more frequently. What side effects may I notice from receiving this medication? Side effects that you should report to your care team as soon as possible: Allergic reactions--skin rash, itching, hives, swelling of the face, lips, tongue, or throat Burning, pain, tingling, or color changes in the legs or feet Heart attack--pain or tightness in the chest, shoulders, arms, or jaw, nausea, shortness of breath, cold or clammy skin, feeling faint or lightheaded Heart rhythm changes--fast or irregular heartbeat, dizziness, feeling faint or lightheaded, chest pain, trouble breathing Increase in blood pressure Raynaud's--cool, numb, or painful fingers or toes that may change  color from pale, to blue, to red Seizures Serotonin syndrome--irritability, confusion, fast  or irregular heartbeat, muscle stiffness, twitching muscles, sweating, high fever, seizure, chills, vomiting, diarrhea Stroke--sudden numbness or weakness of the face, arm, or leg, trouble speaking, confusion, trouble walking, loss of balance or coordination, dizziness, severe headache, change in vision Sudden or severe stomach pain, nausea, vomiting, fever, or bloody diarrhea Vision loss Side effects that usually do not require medical attention (report to your care team if they continue or are bothersome): Dizziness General discomfort or fatigue This list may not describe all possible side effects. Call your doctor for medical advice about side effects. You may report side effects to FDA at 1-800-FDA-1088. Where should I keep my medication? Keep out of the reach of children and pets. Store at room temperature between 2 and 30 degrees C (36 and 86 degrees F). Throw away any unused medication after the expiration date. NOTE: This sheet is a summary. It may not cover all possible information. If you have questions about this medicine, talk to your doctor, pharmacist, or health care provider.  2023 Elsevier/Gold Standard (2007-11-07 00:00:00)

## 2022-05-10 NOTE — Progress Notes (Signed)
Assessment and Plan:  Jason Guerrero was seen today for a episodic vist.  Diagnoses and all order for this visit:  1. Acute non-recurrent frontal sinusitis Stay well hydrated.  - amoxicillin-clavulanate (AUGMENTIN) 500-125 MG tablet; Take 1 tablet (500 mg total) by mouth 3 (three) times daily for 5 days.  Dispense: 15 tablet; Refill: 0 - POC COVID-19 Negative  2. Acute cough OTC cough medication PRN Stay well hydrated  - predniSONE (DELTASONE) 20 MG tablet; Take 2 tabs (40 mg) for 3 days followed by 1 tab (20 mg) for 4 days.  Dispense: 10 tablet; Refill: 0  3. Low grade fever Tylenol PRN Rest.  4. Other fatigue Rest  5.  Headache  Take Sumatriptan PRN at the start of the HA. Stay well hydrated. Avoid triggers If >4 HA in 1 mo may need prophylactic therapy. Discussed possible Xanax withdraw Continue to monitor   Orders Placed This Encounter  Procedures   POC COVID-19    Order Specific Question:   Previously tested for COVID-19    Answer:   No    Order Specific Question:   Resident in a congregate (group) care setting    Answer:   No    Order Specific Question:   Employed in healthcare setting    Answer:   No    Meds ordered this encounter  Medications   SUMAtriptan (IMITREX) 50 MG tablet    Sig: Take 1 tablet (50 mg total) by mouth once as needed for migraine. May repeat in 2 hours if headache persists or recurs.    Dispense:  30 tablet    Refill:  2    Order Specific Question:   Supervising Provider    Answer:   Lucky Cowboy [[id number]]   amoxicillin-clavulanate (AUGMENTIN) 500-125 MG tablet    Sig: Take 1 tablet (500 mg total) by mouth 3 (three) times daily for 5 days.    Dispense:  15 tablet    Refill:  0    Order Specific Question:   Supervising Provider    Answer:   Lucky Cowboy [[id number]]   predniSONE (DELTASONE) 20 MG tablet    Sig: Take 2 tabs (40 mg) for 3 days followed by 1 tab (20 mg) for 4 days.    Dispense:  10 tablet    Refill:  0    Order  Specific Question:   Supervising Provider    Answer:   Lucky Cowboy [phone]    Report to ER for any increase in difficulty breathing. Notify office for further evaluation and treatment, questions or concerns if s/s fail to improve. The risks and benefits of my recommendations, as well as other treatment options were discussed with the patient today. Questions were answered.  Further disposition pending results of labs. Discussed med's effects and SE's.    Over 15 minutes of exam, counseling, chart review, and critical decision making was performed.   Future Appointments  Date Time Provider Department Center  06/27/2022  3:30 PM Lucky Cowboy, MD [facility] None  12/23/2022 11:00 AM Lucky Cowboy, MD [facility] None    ------------------------------------------------------------------------------------------------------------------   HPI BP (!) 118/90   Pulse 86   Temp 97.9 F (36.6 C)   Ht 5' 10.5" (1.791 m)   Wt 183 lb (83 kg)   SpO2 99%   BMI 25.89 kg/m   41 y.o.male presents for evaluation of headache accompanied by cough, nasal congestion that occurs in the frontal area of head. Feels as though the HA is r/t sinus pressure/infection.  At times, will wake him up.  Denies nausea.  Has had consistently every day for the past 2 weeks.  Consistent thorughout the day.  Starts lighter in the mornings and then gets worse thorugh the day.  Tender to palpation over the left eye.  He has taken IBU 800 mg, with minimal immrovement. He tries to stay well hydrated. No vision changes, he wears blue light glasses at work, no dizziness.  Mild sensitiyt to light, not sound.  Associated neck and shoulder pain.    Of note he is now completely off of the Xanax.  Reports the HA was present p/t tapering off the Xanax. Denies any withdrawal symptoms.  Had Covid x1 2 years ago.  Symptoms are similar.  Has not been vaccinated.  Past Medical History:  Diagnosis Date   Anxiety    HLD  (hyperlipidemia)    Insomnia    Lumbago    Tension headache    Unspecified vitamin D deficiency      Allergies  Allergen Reactions   Sulfa Antibiotics    Trazodone And Nefazodone     Severe dreams    Current Outpatient Medications on File Prior to Visit  Medication Sig   amitriptyline (ELAVIL) 50 MG tablet Take  1 tablet  3 x /day  for Chronic Neck Pain (Patient taking differently: Take  1 tablet 2 x /day  for Chronic Neck Pain)   b complex vitamins capsule Take 1 capsule by mouth daily.   busPIRone (BUSPAR) 7.5 MG tablet Take 1 tablet (7.5 mg total) by mouth 3 (three) times daily.   Cholecalciferol (VITAMIN D-3) 5000 UNITS TABS Take 2 tablets by mouth daily. Taking 10,000 units   escitalopram (LEXAPRO) 20 MG tablet Take 1 tablet by mouth once daily   hydrochlorothiazide (HYDRODIURIL) 25 MG tablet TAKE 1 TABLET BY MOUTH ONCE DAILY FOR BLOOD PRESSURE AND  FLUID  RETENTION   Magnesium 250 MG TABS Take 1 tablet by mouth daily.    nadolol (CORGARD) 20 MG tablet Take 1 tablet (20 mg total) by mouth daily. for blood pressure (Patient taking differently: Take 10 mg by mouth daily. for blood pressure)   olmesartan (BENICAR) 20 MG tablet Take  1 tablet  Daily    for BP                                      /                         TAKE                               BY              MOUTH                        ONCE DAILY   Omega-3 Fatty Acids (FISH OIL PO) Take 300 mg by mouth daily.   omeprazole (PRILOSEC) 40 MG capsule TAKE 1 CAPSULE BY MOUTH ONCE DAILY TO PREVENT HEARTBURN AND INDIGESTION   rosuvastatin (CRESTOR) 20 MG tablet TAKE 1 TABLET BY MOUTH ONCE DAILY FOR CHOLESTEROL   ALPRAZolam (XANAX) 1 MG tablet TAKE ONE-HALF TO ONE TABLET BY MOUTH AT BEDTIME ONLY IF NEEDED FOR SLEEP AND LIMIT TO 5 DAYS/ WEEK TO AVOID ADDICTION AND  DEMENTA (Patient not taking: Reported on 05/10/2022)   Current Facility-Administered Medications on File Prior to Visit  Medication   dexamethasone (DECADRON) injection  10 mg    ROS: all negative except what is noted in the HPI.   ROS   Physical Exam:  BP (!) 118/90   Pulse 86   Temp 97.9 F (36.6 C)   Ht 5' 10.5" (1.791 m)   Wt 183 lb (83 kg)   SpO2 99%   BMI 25.89 kg/m   General Appearance: NAD.  Awake, conversant and cooperative. Eyes: PERRLA, EOMs intact.  Sclera white.  Conjunctiva without erythema. Sinuses: No frontal/maxillary tenderness.  No nasal discharge. Nares patent.  ENT/Mouth: Ext aud canals clear.  Bilateral TMs w/DOL and without erythema or bulging. Hearing intact.  Posterior pharynx without swelling or exudate.  Tonsils without swelling or erythema.  Neck: Supple.  No masses, nodules or thyromegaly. Respiratory: Effort is regular with non-labored breathing. Breath sounds are equal bilaterally without rales, rhonchi, wheezing or stridor.  Cardio: RRR with no MRGs. Brisk peripheral pulses without edema.  Abdomen: Active BS in all four quadrants.  Soft and non-tender without guarding, rebound tenderness, hernias or masses. Lymphatics: Non tender without lymphadenopathy.  Musculoskeletal: Full ROM, 5/5 strength, normal ambulation.  No clubbing or cyanosis. Skin: Appropriate color for ethnicity. Warm without rashes, lesions, ecchymosis, ulcers.  Neuro: CN II-XII grossly normal. Normal muscle tone without cerebellar symptoms and intact sensation.   Psych: AO X 3,  appropriate mood and affect, insight and judgment.     Adela Glimpse, NP 10:31 AM [facility]

## 2022-05-13 MED ORDER — PREDNISONE 20 MG PO TABS: ORAL_TABLET | ORAL | 0 refills | Status: DC

## 2022-05-13 MED ORDER — AMOXICILLIN-POT CLAVULANATE 500-125 MG PO TABS: 1.0000 | ORAL_TABLET | Freq: Three times a day (TID) | ORAL | 0 refills | Status: AC

## 2022-05-15 ENCOUNTER — Encounter: Payer: Self-pay | Admitting: Nurse Practitioner

## 2022-05-15 LAB — POC COVID19 BINAXNOW: SARS Coronavirus 2 Ag: NEGATIVE

## 2022-06-24 ENCOUNTER — Encounter: Payer: Self-pay | Admitting: Nurse Practitioner

## 2022-06-25 ENCOUNTER — Other Ambulatory Visit: Payer: Self-pay

## 2022-06-25 DIAGNOSIS — K219 Gastro-esophageal reflux disease without esophagitis: Secondary | ICD-10-CM

## 2022-06-25 DIAGNOSIS — F419 Anxiety disorder, unspecified: Secondary | ICD-10-CM

## 2022-06-25 MED ORDER — OMEPRAZOLE 40 MG PO CPDR: DELAYED_RELEASE_CAPSULE | ORAL | 0 refills | Status: DC

## 2022-06-25 MED ORDER — ESCITALOPRAM OXALATE 20 MG PO TABS: 20.0000 mg | ORAL_TABLET | Freq: Every day | ORAL | 1 refills | Status: DC

## 2022-06-27 ENCOUNTER — Encounter: Payer: Self-pay | Admitting: Internal Medicine

## 2022-06-27 ENCOUNTER — Ambulatory Visit (INDEPENDENT_AMBULATORY_CARE_PROVIDER_SITE_OTHER): Payer: BC Managed Care – PPO | Admitting: Internal Medicine

## 2022-06-27 VITALS — BP 124/72 | HR 72 | Temp 97.9°F | Resp 16 | Ht 70.5 in | Wt 188.2 lb

## 2022-06-27 DIAGNOSIS — R7309 Other abnormal glucose: Secondary | ICD-10-CM

## 2022-06-27 DIAGNOSIS — I10 Essential (primary) hypertension: Secondary | ICD-10-CM | POA: Diagnosis not present

## 2022-06-27 DIAGNOSIS — Z79899 Other long term (current) drug therapy: Secondary | ICD-10-CM

## 2022-06-27 DIAGNOSIS — E559 Vitamin D deficiency, unspecified: Secondary | ICD-10-CM | POA: Diagnosis not present

## 2022-06-27 DIAGNOSIS — E782 Mixed hyperlipidemia: Secondary | ICD-10-CM

## 2022-06-27 DIAGNOSIS — B079 Viral wart, unspecified: Secondary | ICD-10-CM

## 2022-06-27 DIAGNOSIS — G44209 Tension-type headache, unspecified, not intractable: Secondary | ICD-10-CM

## 2022-06-27 MED ORDER — BUTALBITAL-APAP-CAFFEINE 50-325-40 MG PO TABS: ORAL_TABLET | ORAL | 0 refills | Status: AC

## 2022-06-27 NOTE — Progress Notes (Signed)
Future Appointments  Date Time Provider Department  06/27/2022  3:30 PM Unk Pinto, MD [facility]  12/23/2022           cpe 11:00 AM Unk Pinto, MD [facility]    History of Present Illness:       This very nice 41 y.o. single WM presents for 6  month follow up with HTN, HLD, Pre-Diabetes and Vitamin D Deficiency.        Patient is treated for HTN  since  & BP has been controlled at home. Today's BP is at goal - 124/72. Patient has had no complaints of any cardiac type chest pain, palpitations, dyspnea Vertell Limber /PND, dizziness, claudication or dependent edema.       Hyperlipidemia is controlled with diet & meds. Patient denies myalgias or other med SE's. Last Lipids were at goal :  Lab Results  Component Value Date   CHOL 173 12/17/2021   HDL 51 12/17/2021   LDLCALC 93 12/17/2021   TRIG 194 (H) 12/17/2021   CHOLHDL 3.4 12/17/2021     Also, the patient has history of T2_NIDDM PreDiabetes and has had no symptoms of reactive hypoglycemia, diabetic polys, paresthesias or visual blurring.  Last A1c was normal  at goal :  Lab Results  Component Value Date   HGBA1C 5.3 12/17/2021        Further, the patient also has history of Vitamin D Deficiency and supplements vitamin D . Last vitamin D was at goal :   Lab Results  Component Value Date   VD25OH 92 12/17/2021     Current Outpatient Medications on File Prior to Visit  Medication Sig   amitriptyline 50 MG tablet Take  1 tablet 2 x /day  for Chronic Neck Pain   b complex vitamins capsule Take 1 capsule daily.   busPIRone  7.5 MG tablet Take 1 tablet  3 times daily.   VITAMIN D 5000 u Taking 10,000 units   escitalopram  20 MG tablet Take 1 tablet  daily.   hydrochlorothiazide  25 MG tablet TAKE 1 TABLET DAILY    Magnesium 250 MG TABS Take 1 tablet daily.    nadolol (CORGARD) 20 MG tablet Take daily   olmesartan (BENICAR) 20 MG tablet Take  1 tablet  Daily    Omega-3 FISH OIL  Take 300 mg  daily.    omeprazole  40 MG capsule TAKE 1 CAPSULE  DAILY    rosuvastatin  20 MG tablet TAKE 1 TABLET  DAILY    SUMAtriptan  50 MG tablet Take 1 tablet  once as needed for migraine. May repeat in 2 hours if headache persists or recurs.     Allergies  Allergen Reactions   Sulfa Antibiotics    Trazodone And Nefazodone     Severe dreams     PMHx:   Past Medical History:  Diagnosis Date   Anxiety    HLD (hyperlipidemia)    Insomnia    Lumbago    Tension headache    Unspecified vitamin D deficiency      Immunization History  Administered Date(s) Administered   Influenza Inj Mdck Quad With Preservative 07/30/2017, 08/25/2018   Influenza, Seasonal, Injecte, Preservative Fre 09/07/2015   PPD Test 06/01/2014, 06/06/2015, 06/28/2016, 07/30/2017, 10/04/2019, 12/07/2020, 12/17/2021   Tdap 10/21/2013     No past surgical history on file.  FHx:    Reviewed / unchanged  SHx:    Reviewed / unchanged   Systems Review:  Constitutional: Denies fever, chills,  wt changes, headaches, insomnia, fatigue, night sweats, change in appetite. Eyes: Denies redness, blurred vision, diplopia, discharge, itchy, watery eyes.  ENT: Denies discharge, congestion, post nasal drip, epistaxis, sore throat, earache, hearing loss, dental pain, tinnitus, vertigo, sinus pain, snoring.  CV: Denies chest pain, palpitations, irregular heartbeat, syncope, dyspnea, diaphoresis, orthopnea, PND, claudication or edema. Respiratory: denies cough, dyspnea, DOE, pleurisy, hoarseness, laryngitis, wheezing.  Gastrointestinal: Denies dysphagia, odynophagia, heartburn, reflux, water brash, abdominal pain or cramps, nausea, vomiting, bloating, diarrhea, constipation, hematemesis, melena, hematochezia  or hemorrhoids. Genitourinary: Denies dysuria, frequency, urgency, nocturia, hesitancy, discharge, hematuria or flank pain. Musculoskeletal: Denies arthralgias, myalgias, stiffness, jt. swelling, pain, limping or strain/sprain.  Skin:  Denies pruritus, rash, hives, warts, acne, eczema or change in skin lesion(s). Neuro: No weakness, tremor, incoordination, spasms, paresthesia or pain. Psychiatric: Denies confusion, memory loss or sensory loss. Endo: Denies change in weight, skin or hair change.  Heme/Lymph: No excessive bleeding, bruising or enlarged lymph nodes.  Physical Exam  BP 124/72   Pulse 72   Temp 97.9 F (36.6 C)   Resp 16   Ht 5' 10.5" (1.791 m)   Wt 188 lb 3.2 oz (85.4 kg)   SpO2 99%   BMI 26.62 kg/m   Appears  well nourished, well groomed  and in no distress.  Eyes: PERRLA, EOMs, conjunctiva no swelling or erythema. Sinuses: No frontal/maxillary tenderness ENT/Mouth: EAC's clear, TM's nl w/o erythema, bulging. Nares clear w/o erythema, swelling, exudates. Oropharynx clear without erythema or exudates. Oral hygiene is good. Tongue normal, non obstructing. Hearing intact.  Neck: Supple. Thyroid not palpable. Car 2+/2+ without bruits, nodes or JVD. Chest: Respirations nl with BS clear & equal w/o rales, rhonchi, wheezing or stridor.  Cor: Heart sounds normal w/ regular rate and rhythm without sig. murmurs, gallops, clicks or rubs. Peripheral pulses normal and equal  without edema.  Abdomen: Soft & bowel sounds normal. Non-tender w/o guarding, rebound, hernias, masses or organomegaly.  Lymphatics: Unremarkable.  Musculoskeletal: Full ROM all peripheral extremities, joint stability, 5/5 strength and normal gait.  Skin: Warm, dry without exposed rashes, lesions or ecchymosis apparent.  Neuro: Cranial nerves intact, reflexes equal bilaterally. Sensory-motor testing grossly intact. Tendon reflexes grossly intact.  Pysch: Alert & oriented x 3.  Insight and judgement nl & appropriate. No ideations.   Assessment and Plan:   1. Essential hypertension  - Continue medication, monitor blood pressure at home.  - Continue DASH diet.  Reminder to go to the ER if any CP,  SOB, nausea, dizziness, severe HA,  changes vision/speech.   - CBC with Differential/Platelet - COMPLETE METABOLIC PANEL WITH GFR - Magnesium - TSH  2. Hyperlipidemia, mixed  - Continue diet/meds, exercise,& lifestyle modifications.  - Continue monitor periodic cholesterol/liver & renal functions    - Lipid panel - TSH  3. Abnormal glucose  - Continue diet, exercise  - Lifestyle modifications.  - Monitor appropriate labs   - Hemoglobin A1c - Insulin, random  4. Vitamin D deficiency  - Continue supplementation   - VITAMIN D 25 Hydroxy   5. Medication management  - CBC with Differential/Platelet - COMPLETE METABOLIC PANEL WITH GFR - Magnesium - Lipid panel - TSH - Hemoglobin A1c - Insulin, random - VITAMIN D 25 Hydroxy          Discussed  regular exercise, BP monitoring, weight control to achieve/maintain BMI less than 25 and discussed med and SE's. Recommended labs to assess /monitor clinical status .  I discussed the assessment and treatment plan  with the patient. The patient was provided an opportunity to ask questions and all were answered. The patient agreed with the plan and demonstrated an understanding of the instructions.  I provided over 30 minutes of exam, counseling, chart review and  complex critical decision making.        The patient was advised to call back or seek an in-person evaluation if the symptoms worsen or if the condition fails to improve as anticipated.   Kirtland Bouchard, MD

## 2022-06-27 NOTE — Patient Instructions (Signed)

## 2022-06-28 LAB — CBC WITH DIFFERENTIAL/PLATELET
Absolute Monocytes: 667 cells/uL (ref 200–950)
Basophils Absolute: 47 cells/uL (ref 0–200)
Basophils Relative: 0.5 %
Eosinophils Absolute: 301 cells/uL (ref 15–500)
Eosinophils Relative: 3.2 %
HCT: 47.3 % (ref 38.5–50.0)
Hemoglobin: 16.6 g/dL (ref 13.2–17.1)
Lymphs Abs: 3130 cells/uL (ref 850–3900)
MCH: 30.9 pg (ref 27.0–33.0)
MCHC: 35.1 g/dL (ref 32.0–36.0)
MCV: 88.1 fL (ref 80.0–100.0)
MPV: 9.6 fL (ref 7.5–12.5)
Monocytes Relative: 7.1 %
Neutro Abs: 5255 cells/uL (ref 1500–7800)
Neutrophils Relative %: 55.9 %
Platelets: 267 10*3/uL (ref 140–400)
RBC: 5.37 10*6/uL (ref 4.20–5.80)
RDW: 12.3 % (ref 11.0–15.0)
Total Lymphocyte: 33.3 %
WBC: 9.4 10*3/uL (ref 3.8–10.8)

## 2022-06-28 LAB — COMPLETE METABOLIC PANEL WITH GFR
AG Ratio: 1.7 (calc) (ref 1.0–2.5)
ALT: 26 U/L (ref 9–46)
AST: 21 U/L (ref 10–40)
Albumin: 5.1 g/dL (ref 3.6–5.1)
Alkaline phosphatase (APISO): 68 U/L (ref 36–130)
BUN: 9 mg/dL (ref 7–25)
CO2: 32 mmol/L (ref 20–32)
Calcium: 10.4 mg/dL — ABNORMAL HIGH (ref 8.6–10.3)
Chloride: 99 mmol/L (ref 98–110)
Creat: 0.98 mg/dL (ref 0.60–1.29)
Globulin: 3 g/dL (calc) (ref 1.9–3.7)
Glucose, Bld: 137 mg/dL — ABNORMAL HIGH (ref 65–99)
Potassium: 3.8 mmol/L (ref 3.5–5.3)
Sodium: 138 mmol/L (ref 135–146)
Total Bilirubin: 0.7 mg/dL (ref 0.2–1.2)
Total Protein: 8.1 g/dL (ref 6.1–8.1)
eGFR: 99 mL/min/{1.73_m2} (ref >=60)

## 2022-06-28 LAB — LIPID PANEL
Cholesterol: 155 mg/dL (ref <200)
HDL: 56 mg/dL (ref >=40)
LDL Cholesterol (Calc): 78 mg/dL (calc)
Non-HDL Cholesterol (Calc): 99 mg/dL (calc) (ref <130)
Total CHOL/HDL Ratio: 2.8 (calc) (ref <5.0)
Triglycerides: 124 mg/dL (ref <150)

## 2022-06-28 LAB — INSULIN, RANDOM: Insulin: 90.6 u[IU]/mL — ABNORMAL HIGH

## 2022-06-28 LAB — HEMOGLOBIN A1C
Hgb A1c MFr Bld: 5.4 % of total Hgb (ref <5.7)
Mean Plasma Glucose: 108 mg/dL
eAG (mmol/L): 6 mmol/L

## 2022-06-28 LAB — MAGNESIUM: Magnesium: 2.4 mg/dL (ref 1.5–2.5)

## 2022-06-28 LAB — VITAMIN D 25 HYDROXY (VIT D DEFICIENCY, FRACTURES): Vit D, 25-Hydroxy: 79 ng/mL (ref 30–100)

## 2022-06-28 LAB — TSH: TSH: 1.59 mIU/L (ref 0.40–4.50)

## 2022-06-29 NOTE — Progress Notes (Signed)
<><><><><><><><><><><><><><><><><><><><><><><><><><><><><><><><><> <><><><><><><><><><><><><><><><><><><><><><><><><><><><><><><><><> -   Test results slightly outside the reference range are not unusual. If there is anything important, I will review this with you,  otherwise it is considered normal test values.  If you have further questions,  please do not hesitate to contact me at the office or via My Chart.  <><><><><><><><><><><><><><><><><><><><><><><><><><><><><><><><><> <><><><><><><><><><><><><><><><><><><><><><><><><><><><><><><><><>  -  Total Chol = 155  &   LDL Chol = 78   - Both  Excellent   - Very low risk for Heart Attack  / Stroke <><><><><><><><><><><><><><><><><><><><><><><><><><><><><><><><><>  -  A1c = 5.4 %  - No Diabetes  - Great  !   <><><><><><><><><><><><><><><><><><><><><><><><><><><><><><><><><>  -  Vitamin D = 79   - Excellent   !   -  Please keep dose same  <><><><><><><><><><><><><><><><><><><><><><><><><><><><><><><><><>  -  All Else - CBC - Kidneys - Electrolytes - Liver - Magnesium & Thyroid    - all  Normal / OK <><><><><><><><><><><><><><><><><><><><><><><><><><><><><><><><><>  -  Keep up the [place] Work  !  <><><><><><><><><><><><><><><><><><><><><><><><><><><><><><><><><>

## 2022-08-01 ENCOUNTER — Other Ambulatory Visit: Payer: Self-pay | Admitting: Internal Medicine

## 2022-08-01 ENCOUNTER — Encounter: Payer: Self-pay | Admitting: Nurse Practitioner

## 2022-08-01 DIAGNOSIS — I10 Essential (primary) hypertension: Secondary | ICD-10-CM

## 2022-08-01 DIAGNOSIS — E782 Mixed hyperlipidemia: Secondary | ICD-10-CM

## 2022-08-01 MED ORDER — HYDROCHLOROTHIAZIDE 25 MG PO TABS: ORAL_TABLET | ORAL | 3 refills | Status: DC

## 2022-08-01 MED ORDER — ROSUVASTATIN CALCIUM 20 MG PO TABS: ORAL_TABLET | ORAL | 3 refills | Status: DC

## 2022-08-18 ENCOUNTER — Encounter: Payer: Self-pay | Admitting: Nurse Practitioner

## 2022-08-19 MED ORDER — AMITRIPTYLINE HCL 50 MG PO TABS: ORAL_TABLET | ORAL | 3 refills | Status: AC

## 2022-09-05 ENCOUNTER — Encounter: Payer: Self-pay | Admitting: Nurse Practitioner

## 2022-09-05 ENCOUNTER — Other Ambulatory Visit: Payer: Self-pay

## 2022-09-05 ENCOUNTER — Ambulatory Visit (INDEPENDENT_AMBULATORY_CARE_PROVIDER_SITE_OTHER): Payer: BC Managed Care – PPO | Admitting: Nurse Practitioner

## 2022-09-05 VITALS — HR 116 | Temp 97.3°F

## 2022-09-05 DIAGNOSIS — J101 Influenza due to other identified influenza virus with other respiratory manifestations: Secondary | ICD-10-CM | POA: Diagnosis not present

## 2022-09-05 DIAGNOSIS — G44209 Tension-type headache, unspecified, not intractable: Secondary | ICD-10-CM | POA: Diagnosis not present

## 2022-09-05 DIAGNOSIS — Z1152 Encounter for screening for COVID-19: Secondary | ICD-10-CM | POA: Diagnosis not present

## 2022-09-05 DIAGNOSIS — R051 Acute cough: Secondary | ICD-10-CM

## 2022-09-05 DIAGNOSIS — R6889 Other general symptoms and signs: Secondary | ICD-10-CM

## 2022-09-05 DIAGNOSIS — F419 Anxiety disorder, unspecified: Secondary | ICD-10-CM

## 2022-09-05 LAB — POC COVID19 BINAXNOW: SARS Coronavirus 2 Ag: NEGATIVE

## 2022-09-05 LAB — POCT INFLUENZA A/B
Influenza A, POC: POSITIVE — AB
Influenza B, POC: NEGATIVE

## 2022-09-05 MED ORDER — PROMETHAZINE-DM 6.25-15 MG/5ML PO SYRP: 5.0000 mL | ORAL_SOLUTION | Freq: Four times a day (QID) | ORAL | 0 refills | Status: DC | PRN

## 2022-09-05 MED ORDER — BENZONATATE 100 MG PO CAPS: 100.0000 mg | ORAL_CAPSULE | Freq: Three times a day (TID) | ORAL | 1 refills | Status: DC | PRN

## 2022-09-05 NOTE — Progress Notes (Signed)
Virtual Visit via Telephone Note  Assessment & Plan  1. Influenza A Positive  2. Flu-like symptoms Remain well hydrated.  - POCT Influenza A/B  3. Encounter for screening for COVID-19 Negative  - POC COVID-19  4. Muscle contraction headache Continue Tylenol and IBU as directed as needed. Rest  5. Acute cough May take Benzonatate during the day to help cough and reduce drowsiness. May take Promethazine during the night to help cough and rest/sleep. Stay well hydrated.   - promethazine-dextromethorphan (PROMETHAZINE-DM) 6.25-15 MG/5ML syrup; Take 5 mLs by mouth 4 (four) times daily as needed for cough.  Dispense: 240 mL; Refill: 0 - benzonatate (TESSALON PERLES) 100 MG capsule; Take 1 capsule (100 mg total) by mouth 3 (three) times daily as needed for cough.  Dispense: 30 capsule; Refill: 1    I discussed the assessment and treatment plan with the patient. The patient was provided an opportunity to ask questions and all were answered. The patient agreed with the plan and demonstrated an understanding of the instructions.   The patient was advised to call back or seek an in-person evaluation if the symptoms worsen or if the condition fails to improve as anticipated.  I provided 15 minutes of non-face-to-face time during this encounter.   Visit Encounter  I connected with Jason Guerrero on 09/05/22 at 11:00 AM EST by telephone and verified that I am speaking with the correct person using two identifiers.  Location: Patient: Museum/gallery conservator: Office   I discussed the limitations, risks, security and privacy concerns of performing an evaluation and management service by telephone and the availability of in person appointments. I also discussed with the patient that there may be a patient responsible charge related to this service. The patient expressed understanding and agreed to proceed.   History of Present Illness: Subjective:     Jason Guerrero is a 41 y.o. male who presents  for evaluation of influenza like symptoms. Symptoms include chest congestion, chest pain during cough, chills, headache, myalgias, night sweats, sneezing, and fever diarrhea and have been present for 4 days. He has tried to alleviate the symptoms with ibuprofen and OTC cold relief medication with minimal relief. High risk factors for influenza complications: co-morbid illness including HTN.  He denies any SOB, difficulty breathing.     Adela Glimpse, NP

## 2022-09-09 MED ORDER — BUSPIRONE HCL 7.5 MG PO TABS: 7.5000 mg | ORAL_TABLET | Freq: Three times a day (TID) | ORAL | 1 refills | Status: DC

## 2022-09-19 ENCOUNTER — Other Ambulatory Visit: Payer: Self-pay | Admitting: Nurse Practitioner

## 2022-09-19 DIAGNOSIS — K219 Gastro-esophageal reflux disease without esophagitis: Secondary | ICD-10-CM

## 2022-10-30 ENCOUNTER — Other Ambulatory Visit: Payer: Self-pay | Admitting: Nurse Practitioner

## 2022-10-30 DIAGNOSIS — F419 Anxiety disorder, unspecified: Secondary | ICD-10-CM

## 2022-11-10 ENCOUNTER — Other Ambulatory Visit: Payer: Self-pay | Admitting: Internal Medicine

## 2022-11-10 ENCOUNTER — Encounter: Payer: Self-pay | Admitting: Nurse Practitioner

## 2022-11-10 DIAGNOSIS — I10 Essential (primary) hypertension: Secondary | ICD-10-CM

## 2022-11-10 MED ORDER — NADOLOL 20 MG PO TABS: ORAL_TABLET | ORAL | 3 refills | Status: DC

## 2022-12-16 ENCOUNTER — Other Ambulatory Visit: Payer: Self-pay | Admitting: Nurse Practitioner

## 2022-12-16 DIAGNOSIS — K219 Gastro-esophageal reflux disease without esophagitis: Secondary | ICD-10-CM

## 2022-12-22 ENCOUNTER — Encounter: Payer: Self-pay | Admitting: Internal Medicine

## 2022-12-22 NOTE — Patient Instructions (Signed)

## 2022-12-22 NOTE — Progress Notes (Unsigned)
Annual  Screening/Preventative Visit  & Comprehensive Evaluation & Examination  Future Appointments  Date Time Provider Department  12/23/2022 11:00 AM Unk Pinto, MD [facility]  12/31/2023 11:00 AM Unk Pinto, MD [facility]            This very nice 42 y.o. single WM presents for a Screening /Preventative Visit & comprehensive evaluation and management of multiple medical co-morbidities.  Patient has been followed for HTN, HLD, Prediabetes and Vitamin D Deficiency. Patient has GERD controlled by his diet & his meds.        Patient relates poor sleep hygiene & snoring,  but is unaware if having apneic episodes . He presents "sleep ap" on his phone which shows frequent episodes of "restless" un restorative sleep. He reports he awakens very tired & feels tired all day .        HTN predates since 2012 & has been well controlled on Nadolol, Olmesartan & HCTZ. Patient's BP has been controlled at home.  Today's BP is at goal -  122/76. Patient denies any cardiac symptoms as chest pain, palpitations, shortness of breath, dizziness or ankle swelling.       Patient's hyperlipidemia is controlled with diet and  Rosuvastatin. Patient denies myalgias or other medication SE's. Last lipids were at goal except slightly elevated Trig's :  Lab Results  Component Value Date   CHOL 155 06/27/2022   HDL 56 06/27/2022   LDLCALC 78 06/27/2022   TRIG 124 06/27/2022   CHOLHDL 2.8 06/27/2022         Patient has been monitored expectantly to abnormal glucose /Prediabetes and patient denies reactive hypoglycemic symptoms, visual blurring, diabetic polys or paresthesias. Last A1c was normal & at goal :   Lab Results  Component Value Date   HGBA1C 5.4 06/27/2022         Finally, patient has history of Vitamin D Deficiency ("28" /2012) and last vitamin D was slightly elevated & dose was tapered :   Lab Results  Component Value Date   VD25OH 79 06/27/2022        Current Outpatient  Medications  Medication Instructions   amitriptyline  50 MG tablet Take  1 tablet  3 x /day  for Chronic Neck Pain   b complex vitamins capsule 1 capsule, Oral, Daily   busPIRone (BUSPAR) 7.5 mg, Oral, 3 times daily   FIORICET    Take 1 tablet  every 4 hours  as Needed  for Headache   VITAMIN D 5000 u 2 tablets, Oral, Daily, Taking 10,000 units   escitalopram (LEXAPRO) 20 mg, Oral, Daily   hydrochlorothiazide  25 MG  Take  1 tablet  Daily    Magnesium 250 MG TABS 1 tablet, Oral, Daily   nadolol  20 MG tablet Take 1/2 to 1 tablet  Daily for BP   olmesartan (20 MG tablet Take  1 tablet  Daily     Omega-3 FISH OIL  Daily   omeprazole  40 MG capsule Daily    rosuvastatin  20 MG tablet Take  1 tablet  Daily           Allergies  Allergen Reactions   Sulfa Antibiotics    Trazodone And Nefazodone     Severe dreams     Past Medical History:  Diagnosis Date   Anxiety    HLD (hyperlipidemia)    Insomnia    Lumbago    Tension headache    Vitamin D deficiency  Health Maintenance  Topic Date Due   COVID-19 Vaccine (1) Never done   INFLUENZA VACCINE  12/28/2021 (Originally 04/30/2021)   TETANUS/TDAP  10/22/2023   Hepatitis C Screening  Completed   HIV Screening  Completed   HPV VACCINES  Aged Out     Immunization History  Administered Date(s) Administered   Influenza Inj Mdck Quad With Preservative 07/30/2017, 08/25/2018   Influenza, Seasonal, Injecte, Preservative Fre 09/07/2015   PPD Test 06/01/2014, 06/06/2015, 06/28/2016, 07/30/2017, 10/04/2019, 12/07/2020   Tdap 10/21/2013    No past surgical history on file.   Family History  Problem Relation Age of Onset   Hypertension Mother    Diabetes Mother    Heart disease Father    Diabetes Father    Colon cancer Maternal Grandfather    Lung cancer Paternal Grandfather    Breast cancer Maternal Grandmother    Ovarian cancer Paternal Grandmother    Hyperlipidemia Father      Social History   Socioeconomic  History   Marital status: Single  Occupational History   \   Tobacco Use   Smoking status: Former    Packs/day: 1.00    Years: 21.00    Pack years: 21.00    Types: Cigarettes    Start date: 09/10/1998    Quit date: 08/04/2019    Years since quitting: 2.3   Smokeless tobacco: Never  Substance Use Topics   Alcohol use: Yes    Alcohol/week: 0.0 standard drinks    Comment: 6 beers per night   Drug use: No      ROS Constitutional: Denies fever, chills, weight loss/gain, headaches, insomnia,  night sweats or change in appetite. Does c/o fatigue. Eyes: Denies redness, blurred vision, diplopia, discharge, itchy or watery eyes.  ENT: Denies discharge, congestion, post nasal drip, epistaxis, sore throat, earache, hearing loss, dental pain, Tinnitus, Vertigo, Sinus pain or snoring.  Cardio: Denies chest pain, palpitations, irregular heartbeat, syncope, dyspnea, diaphoresis, orthopnea, PND, claudication or edema Respiratory: denies cough, dyspnea, DOE, pleurisy, hoarseness, laryngitis or wheezing.  Gastrointestinal: Denies dysphagia, heartburn, reflux, water brash, pain, cramps, nausea, vomiting, bloating, diarrhea, constipation, hematemesis, melena, hematochezia, jaundice or hemorrhoids Genitourinary: Denies dysuria, frequency, urgency, nocturia, hesitancy, discharge, hematuria or flank pain Musculoskeletal: Denies arthralgia, myalgia, stiffness, Jt. Swelling, pain, limp or strain/sprain. Denies Falls. Skin: Denies puritis, rash, hives, warts, acne, eczema or change in skin lesion Neuro: No weakness, tremor, incoordination, spasms, paresthesia or pain Psychiatric: Denies confusion, memory loss or sensory loss. Denies Depression. Endocrine: Denies change in weight, skin, hair change, nocturia, and paresthesia, diabetic polys, visual blurring or hyper / hypo glycemic episodes.  Heme/Lymph: No excessive bleeding, bruising or enlarged lymph nodes.   Physical Exam  There were no vitals taken  for this visit.  General Appearance: Well nourished and well groomed and in no apparent distress.  Eyes: PERRLA, EOMs, conjunctiva no swelling or erythema, normal fundi and vessels. Sinuses: No frontal/maxillary tenderness ENT/Mouth: EACs patent / TMs  nl. Nares clear without erythema, swelling, mucoid exudates. Oral hygiene is good. No erythema, swelling, or exudate. Tongue normal, non-obstructing. Tonsils not swollen or erythematous. Hearing normal.  Neck: Supple, thyroid not palpable. No bruits, nodes or JVD. Respiratory: Respiratory effort normal.  BS equal and clear bilateral without rales, rhonci, wheezing or stridor. Cardio: Heart sounds are normal with regular rate and rhythm and no murmurs, rubs or gallops. Peripheral pulses are normal and equal bilaterally without edema. No aortic or femoral bruits. Chest: symmetric with normal excursions and percussion.  Abdomen: Soft, with Nl bowel sounds. Nontender, no guarding, rebound, hernias, masses, or organomegaly.  Lymphatics: Non tender without lymphadenopathy.  Musculoskeletal: Full ROM all peripheral extremities, joint stability, 5/5 strength, and normal gait. Skin: Warm and dry without rashes, lesions, cyanosis, clubbing or  ecchymosis.  Neuro: Cranial nerves intact, reflexes equal bilaterally. Normal muscle tone, no cerebellar symptoms. Sensation intact.  Pysch: Alert and oriented X 3 with normal affect, insight and judgment appropriate.   Assessment and Plan  1. Annual Preventative/Screening Exam    2. Essential hypertension  - EKG 12-Lead - Urinalysis, Routine w reflex microscopic - Microalbumin / creatinine urine ratio - CBC with Differential/Platelet - COMPLETE METABOLIC PANEL WITH GFR - Magnesium - TSH  3. Hyperlipidemia, mixed  - EKG 12-Lead - Lipid panel - TSH  4. Abnormal glucose  - EKG 12-Lead - Hemoglobin A1c - Insulin, random  5. Vitamin D deficiency  - VITAMIN D 25 Hydroxyl  6. RLS (restless legs  syndrome)  - rOPINIRole (REQUIP) 2 MG tablet;  Take  1/4 to 1/2 to 1 tablet   3 x /day  for Restless Legs   Dispense: 90 tablet; Refill: 0  7. Gastroesophageal reflux disease without esophagitis  - CBC with Differential/Platelet  8. Screening for colorectal cancer  - POC Hemoccult Bld/Stl   9. Sleep pattern disturbance  - Ambulatory referral to Sleep Studies  10. Prostate cancer screening  - PSA  11. Screening-pulmonary TB  - TB Skin Test  12. Screening for heart disease  - EKG 12-Lead  13. FHx: heart disease  - EKG 12-Lead  14. Former smoker  - EKG 12-Lead  15. Fatigue, unspecified type  - Iron, Total/Total Iron Binding Cap - Vitamin B12 - Testosterone - CBC with Differential/Platelet - TSH  16. Medication management  - Urinalysis, Routine w reflex microscopic - Microalbumin / creatinine urine ratio - CBC with Differential/Platelet - COMPLETE METABOLIC PANEL WITH GFR - Magnesium - Lipid panel - TSH - Hemoglobin A1c - Insulin, random - VITAMIN D 25 Hydroxy         Patient was counseled in prudent diet, weight control to achieve/maintain BMI less than 25, BP monitoring, regular exercise and medications as discussed.  Discussed med effects and SE's. Routine screening labs and tests as requested with regular follow-up as recommended. Over 40 minutes of exam, counseling, chart review and high complex critical decision making was performed   Kirtland Bouchard, MD

## 2022-12-23 ENCOUNTER — Ambulatory Visit (INDEPENDENT_AMBULATORY_CARE_PROVIDER_SITE_OTHER): Payer: BC Managed Care – PPO | Admitting: Internal Medicine

## 2022-12-23 ENCOUNTER — Encounter: Payer: Self-pay | Admitting: Internal Medicine

## 2022-12-23 VITALS — BP 120/82 | HR 75 | Temp 97.9°F | Resp 16 | Ht 70.5 in | Wt 202.6 lb

## 2022-12-23 DIAGNOSIS — Z125 Encounter for screening for malignant neoplasm of prostate: Secondary | ICD-10-CM

## 2022-12-23 DIAGNOSIS — Z0000 Encounter for general adult medical examination without abnormal findings: Secondary | ICD-10-CM

## 2022-12-23 DIAGNOSIS — Z111 Encounter for screening for respiratory tuberculosis: Secondary | ICD-10-CM | POA: Diagnosis not present

## 2022-12-23 DIAGNOSIS — N401 Enlarged prostate with lower urinary tract symptoms: Secondary | ICD-10-CM

## 2022-12-23 DIAGNOSIS — Z130 Encounter for screening for diseases of the blood and blood-forming organs and certain disorders involving the immune mechanism: Secondary | ICD-10-CM

## 2022-12-23 DIAGNOSIS — Z8249 Family history of ischemic heart disease and other diseases of the circulatory system: Secondary | ICD-10-CM

## 2022-12-23 DIAGNOSIS — E782 Mixed hyperlipidemia: Secondary | ICD-10-CM

## 2022-12-23 DIAGNOSIS — I10 Essential (primary) hypertension: Secondary | ICD-10-CM | POA: Diagnosis not present

## 2022-12-23 DIAGNOSIS — Z0001 Encounter for general adult medical examination with abnormal findings: Secondary | ICD-10-CM

## 2022-12-23 DIAGNOSIS — Z13220 Encounter for screening for lipoid disorders: Secondary | ICD-10-CM

## 2022-12-23 DIAGNOSIS — Z79899 Other long term (current) drug therapy: Secondary | ICD-10-CM

## 2022-12-23 DIAGNOSIS — Z131 Encounter for screening for diabetes mellitus: Secondary | ICD-10-CM | POA: Diagnosis not present

## 2022-12-23 DIAGNOSIS — Z1211 Encounter for screening for malignant neoplasm of colon: Secondary | ICD-10-CM

## 2022-12-23 DIAGNOSIS — G4720 Circadian rhythm sleep disorder, unspecified type: Secondary | ICD-10-CM

## 2022-12-23 DIAGNOSIS — R7309 Other abnormal glucose: Secondary | ICD-10-CM

## 2022-12-23 DIAGNOSIS — R5383 Other fatigue: Secondary | ICD-10-CM

## 2022-12-23 DIAGNOSIS — Z1329 Encounter for screening for other suspected endocrine disorder: Secondary | ICD-10-CM

## 2022-12-23 DIAGNOSIS — G2581 Restless legs syndrome: Secondary | ICD-10-CM

## 2022-12-23 DIAGNOSIS — E559 Vitamin D deficiency, unspecified: Secondary | ICD-10-CM | POA: Diagnosis not present

## 2022-12-23 DIAGNOSIS — Z1389 Encounter for screening for other disorder: Secondary | ICD-10-CM

## 2022-12-23 DIAGNOSIS — K219 Gastro-esophageal reflux disease without esophagitis: Secondary | ICD-10-CM

## 2022-12-23 DIAGNOSIS — Z87891 Personal history of nicotine dependence: Secondary | ICD-10-CM

## 2022-12-23 DIAGNOSIS — Z136 Encounter for screening for cardiovascular disorders: Secondary | ICD-10-CM | POA: Diagnosis not present

## 2022-12-23 MED ORDER — ROPINIROLE HCL 2 MG PO TABS: ORAL_TABLET | ORAL | 0 refills | Status: DC

## 2022-12-24 LAB — LIPID PANEL
Cholesterol: 179 mg/dL (ref <200)
HDL: 39 mg/dL — ABNORMAL LOW (ref >=40)
Non-HDL Cholesterol (Calc): 140 mg/dL (calc) — ABNORMAL HIGH (ref <130)
Total CHOL/HDL Ratio: 4.6 (calc) (ref <5.0)
Triglycerides: 451 mg/dL — ABNORMAL HIGH (ref <150)

## 2022-12-24 LAB — COMPLETE METABOLIC PANEL WITH GFR
AG Ratio: 1.6 (calc) (ref 1.0–2.5)
ALT: 27 U/L (ref 9–46)
AST: 20 U/L (ref 10–40)
Albumin: 4.7 g/dL (ref 3.6–5.1)
Alkaline phosphatase (APISO): 66 U/L (ref 36–130)
BUN: 16 mg/dL (ref 7–25)
CO2: 28 mmol/L (ref 20–32)
Calcium: 10.3 mg/dL (ref 8.6–10.3)
Chloride: 101 mmol/L (ref 98–110)
Creat: 0.87 mg/dL (ref 0.60–1.29)
Globulin: 3 g/dL (calc) (ref 1.9–3.7)
Glucose, Bld: 79 mg/dL (ref 65–99)
Potassium: 4.2 mmol/L (ref 3.5–5.3)
Sodium: 138 mmol/L (ref 135–146)
Total Bilirubin: 0.5 mg/dL (ref 0.2–1.2)
Total Protein: 7.7 g/dL (ref 6.1–8.1)
eGFR: 110 mL/min/{1.73_m2} (ref >=60)

## 2022-12-24 LAB — CBC WITH DIFFERENTIAL/PLATELET
Absolute Monocytes: 783 cells/uL (ref 200–950)
Basophils Absolute: 63 cells/uL (ref 0–200)
Basophils Relative: 0.7 %
Eosinophils Absolute: 315 cells/uL (ref 15–500)
Eosinophils Relative: 3.5 %
HCT: 44 % (ref 38.5–50.0)
Hemoglobin: 15.2 g/dL (ref 13.2–17.1)
Lymphs Abs: 3357 cells/uL (ref 850–3900)
MCH: 30 pg (ref 27.0–33.0)
MCHC: 34.5 g/dL (ref 32.0–36.0)
MCV: 86.8 fL (ref 80.0–100.0)
MPV: 9.8 fL (ref 7.5–12.5)
Monocytes Relative: 8.7 %
Neutro Abs: 4482 cells/uL (ref 1500–7800)
Neutrophils Relative %: 49.8 %
Platelets: 258 10*3/uL (ref 140–400)
RBC: 5.07 10*6/uL (ref 4.20–5.80)
RDW: 13.1 % (ref 11.0–15.0)
Total Lymphocyte: 37.3 %
WBC: 9 10*3/uL (ref 3.8–10.8)

## 2022-12-24 LAB — URINALYSIS, ROUTINE W REFLEX MICROSCOPIC
Bilirubin Urine: NEGATIVE
Glucose, UA: NEGATIVE
Hgb urine dipstick: NEGATIVE
Ketones, ur: NEGATIVE
Leukocytes,Ua: NEGATIVE
Nitrite: NEGATIVE
Protein, ur: NEGATIVE
Specific Gravity, Urine: 1.019 (ref 1.001–1.035)
pH: 6.5 (ref 5.0–8.0)

## 2022-12-24 LAB — IRON, TOTAL/TOTAL IRON BINDING CAP
%SAT: 42 % (calc) (ref 20–48)
Iron: 130 ug/dL (ref 50–180)
TIBC: 312 mcg/dL (calc) (ref 250–425)

## 2022-12-24 LAB — HEMOGLOBIN A1C
Hgb A1c MFr Bld: 5.7 % of total Hgb — ABNORMAL HIGH (ref <5.7)
Mean Plasma Glucose: 117 mg/dL
eAG (mmol/L): 6.5 mmol/L

## 2022-12-24 LAB — MICROALBUMIN / CREATININE URINE RATIO
Creatinine, Urine: 106 mg/dL (ref 20–320)
Microalb, Ur: <0.2 mg/dL

## 2022-12-24 LAB — VITAMIN B12: Vitamin B-12: 490 pg/mL (ref 200–1100)

## 2022-12-24 LAB — MAGNESIUM: Magnesium: 2.3 mg/dL (ref 1.5–2.5)

## 2022-12-24 LAB — VITAMIN D 25 HYDROXY (VIT D DEFICIENCY, FRACTURES): Vit D, 25-Hydroxy: 62 ng/mL (ref 30–100)

## 2022-12-24 LAB — TESTOSTERONE: Testosterone: 288 ng/dL (ref 250–827)

## 2022-12-24 LAB — INSULIN, RANDOM: Insulin: 13.8 u[IU]/mL

## 2022-12-24 LAB — TSH: TSH: 1.82 mIU/L (ref 0.40–4.50)

## 2022-12-24 LAB — PSA: PSA: 0.3 ng/mL (ref <=4.00)

## 2022-12-25 NOTE — Progress Notes (Signed)
<><><><><><><><><><><><><><><><><><><><><><><><><><><><><><><><><> <><><><><><><><><><><><><><><><><><><><><><><><><><><><><><><><><> - Test results slightly outside the reference range are not unusual. If there is anything important, I will review this with you,  otherwise it is considered normal test values.  If you have further questions,  please do not hesitate to contact me at the office or via My Chart.  <><><><><><><><><><><><><><><><><><><><><><><><><><><><><><><><><> <><><><><><><><><><><><><><><><><><><><><><><><><><><><><><><><><>  -  Total Chol = 179 is at goal ( less than 180)  -    Very Good   - Very low risk for Heart Attack  / Stroke <><><><><><><><><><><><><><><><><><><><><><><><><><><><><><><><><>  -  But Triglycerides ( = 451)     ) or fats in blood are  Way Way  too high                 (   Ideal or  Goal is less than 150  !  )    - Recommend avoid fried & greasy foods,  sweets / candy,   - Avoid white rice  (brown or wild rice or Quinoa is OK),   - Avoid white potatoes  (sweet potatoes are OK)   - Avoid anything made from white flour  - bagels, doughnuts, rolls, buns, biscuits, white and   wheat breads, pizza crust and traditional  pasta made of white flour & egg white  - (vegetarian pasta or spinach or wheat pasta is OK).    - Multi-grain bread is OK - like multi-grain flat bread or  sandwich thins.   - Avoid alcohol in excess.   - Exercise is also important.] <><><><><><><><><><><><><><><><><><><><><><><><><><><><><><><><><> <><><><><><><><><><><><><><><><><><><><><><><><><><><><><><><><><>  -  A1c = 5.7% Blood sugar average  is now elevated in the borderline and                                                              early or pre-diabetes range which has the same         300% increased risk for heart attack, stroke, cancer and                                                 alzheimer- type vascular dementia as full blown diabetes.   - High  blood sugar is most likely why the Triglycerides  ( blood fats ) are also elevated  !   -  But the good news is that diet, exercise with weight loss can                                                                                   cure the early diabetes at this point. <><><><><><><><><><><><><><><><><><><><><><><><><><><><><><><><><> <><><><><><><><><><><><><><><><><><><><><><><><><><><><><><><><><>   -  Iron levels are normal & OK <><><><><><><><><><><><><><><><><><><><><><><><><><><><><><><><><>   -  Vitamin B12 = 490   is borderline  Low Normal  (Ideal or Goal Vit B12 is between 450 - 1,100)   - Low Vit B12 may be associated with  Anemia , Fatigue,   Peripheral Neuropathy, Dementia, "Brain Fog", & Depression  - Recommend take a sub-lingual form of Vitamin B12 tablet   1,000 to 5,000 mcg tab that you dissolve under your tongue /Daily   - Can get Baron Sane - best price at [employer] or on [employer] <><><><><><><><><><><><><><><><><><><><><><><><><><><><><><><><><>   -  PSA  - is  Low - No prostate Cancer - Great ! <><><><><><><><><><><><><><><><><><><><><><><><><><><><><><><><><>   -  Testosterone is better - in low normal range   - Recommend take Zinc 50 mg tablet Daily to help raise Testosterone levels.  <><><><><><><><><><><><><><><><><><><><><><><><><><><><><><><><><>  -  9 Ways to Naturally Increase Testosterone Levels  1.   Lose Weight  If you're overweight, shedding the excess pounds may increase your testosterone levels,  according to research presented at the Endocrine Society's 2012 meeting.  Overweight  men are more likely to have low testosterone levels to begin with, so this is an  important trick to increase your body's testosterone production when you need it most.  2.   High-Intensity Exercise like Peak Fitness   Short intense exercise has a proven positive effect on increasing testosterone levels  and preventing its decline. That's unlike aerobics  or prolonged moderate exercise,  which have shown to have negative or no effect on testosterone levels. Having a whey protein meal after exercise can further enhance the  satiety/testosterone-boosting impact (hunger hormones cause the opposite effect on  your testosterone and libido).   Here's a summary of what a typical high-intensity Peak Fitness routine might look like: " Warm up for three minutes  " Exercise as hard and fast as you can for 30 seconds. You should feel like you                                                                     couldn't possibly go on another few seconds  " Recover at a slow to moderate pace for 90 seconds  " Repeat the high intensity exercise and recovery 7 more times .  3.   Consume Plenty of Zinc  The mineral zinc is important for testosterone production, and supplementing your diet for  as little as six weeks has been shown to cause a marked improvement in testosterone  among men with low levels.   Likewise, research has shown that restricting dietary  sources of zinc leads to a significant decrease in testosterone, while zinc supplementation  increases it - and even protects men from exercised-induced reductions in  testosterone levels.  It's estimated that up to 12 percent of adults over the age of 60 may have lower than  recommended zinc intakes; even when dietary supplements were added in, an estimated  20-25 percent of older adults still had inadequate zinc intakes, according to a  [employer] and Nutrition Examination Survey.  Your diet is the best source of zinc; along with protein-rich foods like  fish, other good  dietary sources of zinc include raw milk, raw cheese, beans, and yogurt or kefir made  from raw milk. It can be difficult to obtain enough dietary zinc if you're a vegetarian and  also for meat-eaters as well, largely because of conventional farming methods that rely  heavily on chemical fertilizers and pesticides. These  chemicals deplete  the soil of  nutrients ... nutrients like zinc that must be absorbed by plants in order to be  passed on to you.  In many cases, you may further deplete the nutrients in your food by the way you prepare it.  For most food, cooking it will drastically reduce its levels of nutrients like zinc ... particularly  over-cooking, which many people do. If you decide to use a zinc supplement, stick to a dosage of 50 mg a day, as this is the  recommended adult dose. Taking too much zinc can interfere with your body's ability to absorb  other minerals, especially copper, and may cause nausea as a side effect.  4.   Strength Training  In addition to [employer] Fitness, strength training is also known to boost testosterone levels,  provided you are doing so intensely enough. When strength training to boost testosterone,  you'll want to increase the weight and lower your number of reps, and then focus on  exercises that work a large number of muscles, such as dead lifts or squats.  You can "turbo-charge" your weight training by going slower. By slowing down your  movement, you're actually turning it into a high-intensity exercise. Super Slow movement   allows your muscle, at the microscopic level, to access the maximum number of cross-bridges  between the protein filaments that produce movement in the muscle.   5.   Optimize Your Vitamin D Levels  Vitamin D, a steroid hormone, is essential for the healthy development of the nucleus of the  sperm cell, and helps maintain semen quality and sperm count. Vitamin D also increases  levels of testosterone, which may boost libido. In one study, overweight men who were given  vitamin D supplements had a significant increase in testosterone levels after one year.5   6.   Reduce Stress  When you're under a lot of stress, your body releases high levels of the stress hormone  cortisol.  This hormone actually blocks the effects of testosterone,6  presumably because, from a  biological standpoint, testosterone-associated behaviors (mating, competing, aggression)  may have lowered your chances of survival in an emergency (hence, the "fight or flight"  response is dominant, courtesy of cortisol).  7.   Limit or Eliminate Sugar from Your Diet  Testosterone levels decrease after you eat sugar, which is likely because the sugar leads  to a high insulin level, another factor leading to low testosterone.7 Based on USDA estimates, the average American consumes 12 teaspoons of sugar a day,  which equates to about TWO TONS of sugar during a lifetime.  8.   Eat Healthy Fats  By healthy, this means not only mono- and polyunsaturated fats, like that found in avocadoes  and nuts, but also saturated, as these are essential for building testosterone. Research shows  that a diet with less than 40 percent of energy as fat (and that mainly from animal sources, i.e.  saturated) lead to a decrease in testosterone levels. ie eat less animal products -  as Meat , poultry and dairy. Experts agree that the ideal diet includes somewhere between  50-70 percent fat.  It's important to understand that your body requires saturated fats from animal and vegetable  sources (such as meat, dairy, certain oils, and tropical plants like coconut) for optimal  functioning, and if you neglect this important food group in favor of sugar, grains and  other  starchy carbs, your health and weight are almost guaranteed to suffer.  Examples of  healthy fats you can eat more of to give your testosterone levels a  boost include:Olives and Olive oil  Coconuts and coconut oil  Butter made from raw grass-fed organic milk  Raw nuts, such as, almonds or pecans  Organic pastured egg yolks AvocadosGrass-fed meats Palm oil  Unheated organic nut oils  9.   Boost Your Intake of Branch Chain Amino Acids (BCAA) from Foods Like Whey  Proteinbody's glycemic control.  Food-based leucine is  really the ideal form that can benefit your muscles  without side effects.  <><><><><><><><><><><><><><><><><><><><><><><><><><><><><><><><><> <><><><><><><><><><><><><><><><><><><><><><><><><><><><><><><><><>   - Vit D = 62 - Great  - Please continue dos e same  <><><><><><><><><><><><><><><><><><><><><><><><><><><><><><><><><>  - All Else - CBC - Kidneys - Electrolytes - Liver - Magnesium & Thyroid    - all  Normal / OK <><><><><><><><><><><><><><><><><><><><><><><><><><><><><><><><><> <><><><><><><><><><><><><><><><><><><><><><><><><><><><><><><><><>             -

## 2022-12-30 ENCOUNTER — Encounter: Payer: Self-pay | Admitting: Internal Medicine

## 2023-01-04 ENCOUNTER — Encounter: Payer: Self-pay | Admitting: Internal Medicine

## 2023-01-23 ENCOUNTER — Encounter: Payer: Self-pay | Admitting: Nurse Practitioner

## 2023-01-29 ENCOUNTER — Encounter: Payer: Self-pay | Admitting: Internal Medicine

## 2023-01-29 ENCOUNTER — Other Ambulatory Visit: Payer: Self-pay | Admitting: Internal Medicine

## 2023-01-29 DIAGNOSIS — G478 Other sleep disorders: Secondary | ICD-10-CM

## 2023-01-29 DIAGNOSIS — G4720 Circadian rhythm sleep disorder, unspecified type: Secondary | ICD-10-CM

## 2023-01-29 DIAGNOSIS — G4733 Obstructive sleep apnea (adult) (pediatric): Secondary | ICD-10-CM

## 2023-03-02 ENCOUNTER — Other Ambulatory Visit: Payer: Self-pay | Admitting: Nurse Practitioner

## 2023-03-02 DIAGNOSIS — F419 Anxiety disorder, unspecified: Secondary | ICD-10-CM

## 2023-03-12 ENCOUNTER — Ambulatory Visit (INDEPENDENT_AMBULATORY_CARE_PROVIDER_SITE_OTHER): Payer: BC Managed Care – PPO | Admitting: Neurology

## 2023-03-12 ENCOUNTER — Encounter: Payer: Self-pay | Admitting: Neurology

## 2023-03-12 VITALS — BP 112/73 | HR 72 | Ht 70.0 in | Wt 201.0 lb

## 2023-03-12 DIAGNOSIS — G4733 Obstructive sleep apnea (adult) (pediatric): Secondary | ICD-10-CM | POA: Diagnosis not present

## 2023-03-12 DIAGNOSIS — R002 Palpitations: Secondary | ICD-10-CM

## 2023-03-12 DIAGNOSIS — F10982 Alcohol use, unspecified with alcohol-induced sleep disorder: Secondary | ICD-10-CM

## 2023-03-12 NOTE — Progress Notes (Signed)
SLEEP MEDICINE CLINIC    Provider:  Melvyn Novas, MD  Primary Care Physician:  Lucky Cowboy, MD [address] [place] [address]     Referring Provider: Lucky Cowboy, Md [address] [place],  [place] [address]          Chief Complaint according to patient   Patient presents with:     New Patient (Initial Visit)           HISTORY OF PRESENT ILLNESS:  Jason Guerrero is a 42 y.o. male patient who is seen upon referral on 03/12/2023 from Oneta Rack, MD  for a sleep consult. .  Chief concern according to patient :  Suspected ot have OSA, non restorative sleep. Seeing dr Oneta Rack has followed him for over 12 years for insomnia, Trazodone did not work, benzodiazepines worked best. Alprazolam had worked for 24 months , required higher doses.   This very nice 42 y.o. single WM presents for a Screening /Preventative Visit & comprehensive evaluation and management of multiple medical co-morbidities.  Patient has been followed for HTN, HLD, Prediabetes and Vitamin D Deficiency. Patient has GERD controlled by his diet & his meds.                                                      Patient relates poor sleep hygiene & snoring,  but is unaware if having apneic episodes . He presents "sleep ap" on his phone which shows frequent episodes of "restless" un restorative sleep. He reports he awakens very tired & feels tired all day .                                                      HTN predates since 2012 & has been well controlled on Nadolol, Olmesartan & HCTZ. Patient's BP has been controlled at home.  Today's BP is at goal -  122/76. Patient denies any cardiac symptoms as chest pain, palpitations, shortness of breath, dizziness or ankle swelling.                                                     Patient's hyperlipidemia is controlled with diet and  Rosuvastatin. Patient denies myalgias or other medication SE's. Last lipids were at goal except  slightly elevated Trig's :     I have the pleasure of seeing Jason Guerrero [birth year] a right-handed male with a chronic insomnia sleep disorder.    The patient just had the first  HST SNAP sleep study, as ordered by dr Oneta Rack-   This test was performed on 4-10 2024 within a 2 -week period of the patient's referral to our sleep clinic.   The patient had on the SNAP STUDY : 5 apneas 70 of these were obstructive apneas the average duration was 25 seconds he had an additional 166 hypopneas with an average duration of 31 seconds.  Lowest oxygen saturation was 79% at nadir.  And following an AHI by AASM criteria of 3% desaturation his  AHI would be 44.8.  This places this patient into the severe sleep apnea category he spent a total of 27 minutes that night with low oxygenation.  This patient should be placed on CPAP which also has been the recommendation of the snap diagnostic portal.  I would like to add that this patient has a height of 5 foot 10 weight of 200 pounds a BMI of 29 and a neck circumference of 14.6 cm.  Snoring was detected  In reviewing the patient's current medication he is on BuSpar which is an antianxiety medication which can also help to fall asleep it was written for 3 times daily he takes it once in the morning I would like for him to consider taking this medication also at night as it can help with insomnia.  For chronic neck pain Elavil at night would be another sleep aid.  Butalbital with caffeine and acetaminophen is a common migraine headache medication and should not be taken in the evening it is due to the caffeine content contraindicated in its insomnia patients.  Lexapro can be taken in the morning or at night.  Hydrochlorothiazide should be taken only in the morning acid will otherwise stimulate bladder movement during the sleep night.  Magnesium to be taken at night, I have no preference for the other medications he lists here Requip for restless legs is ordered is asked to up to 3  times a day Requip does make people excessively sleepy as a side effect   Sleep relevant medical history: Nocturia/ 2-3 , RLS, Sleep walking in childhood and into his  twenties. Family medical /sleep history: "my father had it " with OSA, dies of heart disease, CHF , pulmonary hypertension, COPD, and  valve disease.    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 Tobacco use' quit 4 years ago. Nicotine pouch, lozenges still used.  weight gain since.  ETOH use :  6- 10 drinks a week, used much more in the past.,  Caffeine intake in form of Coffee( 1-2 cups a day, cut back form 4-6 ) Soda( /) Tea ( /) or energy drinks Exercise in form of biking .   Hobbies :cooking       Sleep habits are as follows:  The patient's dinner time is between 6 PM. The patient goes to bed at 10-11 PM and continues to sleep for 6 hours, wakes for 2-3 bathroom breaks, the first time at 1 AM.   The preferred sleep position is laterally, with the support of 1 thick pillow.  Dreams are reportedly frequent/vivid. Loud snoring .  The patient wakes up spontaneously/ most mornings with an alarm. 6.30  AM is the usual rise time. He reports not feeling refreshed or restored in AM, with symptoms such as dry mouth, morning headaches, and residual fatigue. Naps are taken infrequently, lasting from 2-20  minutes and are more/ less refreshing than nocturnal sleep.    Review of Systems: Out of a complete 14 system review, the patient complains of only the following symptoms, and all other reviewed systems are negative.:  Fatigue, sleepiness , snoring,    Previously alcohol induced sleep disorder with insomnia. Snoring.  Fragmented sleep, Insomnia, RLS, Nocturia 2-3    How likely are you to doze in the following situations: 0 = not likely, 1 = slight chance, 2 =  moderate chance, 3 = high chance    Sitting and Reading? Watching Television? Sitting inactive in a public place (theater or meeting)? As a passenger in a car for an hour without a break? Lying down in the afternoon when circumstances permit? Sitting and talking to someone? Sitting quietly after lunch without alcohol? In a car, while stopped for a few minutes in traffic?   Total = 16/ 24 points   FSS endorsed at XXX/ 63 points.   Social History   Socioeconomic History   Marital status: Single    Spouse name: Not on file   Number of children: Not on file   Years of education: Not on file   Highest education level: Not on file  Occupational History   Not on file  Tobacco Use   Smoking status: Former    Packs/day: 1.00    Years: 21.00    Additional pack years: 0.00    Total pack years: 21.00    Types: Cigarettes    Start date: 09/10/1998    Quit date: 08/04/2019    Years since quitting: 3.6   Smokeless tobacco: Never  Substance and Sexual Activity   Alcohol use: Yes    Alcohol/week: 0.0 standard drinks of alcohol    Comment: 6 beers per night   Drug use: No   Sexual activity: Not on file  Other Topics Concern   Not on file  Social History Narrative   Not on file   Social Determinants of Health   Financial Resource Strain: Not on file  Food Insecurity: Not on file  Transportation Needs: Not on file  Physical Activity: Not on file  Stress: Not on file  Social Connections: Not on file    Family History  Problem Relation Age of Onset   Hypertension Mother    Diabetes Mother    Heart disease Father    Diabetes Father    Colon cancer Maternal Grandfather    Lung cancer Paternal Grandfather    Breast cancer Maternal Grandmother    Ovarian cancer Paternal Grandmother    Hyperlipidemia Father     Past Medical History:  Diagnosis Date   Anxiety    HLD (hyperlipidemia)    Insomnia    Lumbago    Tension headache    Unspecified vitamin D deficiency     History reviewed. No pertinent  surgical history.   Current Outpatient Medications on File Prior to Visit  Medication Sig Dispense Refill   amitriptyline (ELAVIL) 50 MG tablet Take  1 tablet  3 x /day  for Chronic Neck Pain 270 tablet 3   b complex vitamins capsule Take 1 capsule by mouth daily.     busPIRone (BUSPAR) 7.5 MG tablet Take 1 tablet (7.5 mg total) by mouth 3 (three) times daily. 90 tablet 1   butalbital-acetaminophen-caffeine (FIORICET) 50-325-40 MG tablet Take 1 tablet  every 4 hours  as Needed  for Headache 30 tablet 0   Cholecalciferol (VITAMIN D-3) 5000 UNITS TABS Take 2 tablets by mouth daily. Taking 10,000 units     escitalopram (LEXAPRO) 20 MG tablet Take  1 tablet  Daily  for Mood & Chronic Anxiety                                                         /  take                                              by                                    mouth                                                 once ?  ? ? ? daily 90 tablet 3   hydrochlorothiazide (HYDRODIURIL) 25 MG tablet Take  1 tablet  Daily for BP & Fluid Retention                                               /                                       TAKE                                               BY                                   MOUTH                            ONCE DAILY 90 tablet 3   Magnesium 250 MG TABS Take 1 tablet by mouth daily.      nadolol (CORGARD) 20 MG tablet Take 1/2 to 1 tablet  Daily for BP 90 tablet 3   olmesartan (BENICAR) 20 MG tablet Take  1 tablet  Daily    for BP                                      /                         TAKE                               BY              MOUTH                        ONCE DAILY 90 tablet 3   Omega-3 Fatty Acids (FISH OIL PO) Take 300 mg by mouth daily.     omeprazole (PRILOSEC) 40 MG capsule TAKE 1 CAPSULE BY MOUTH ONCE DAILY TO  PREVENT  HEARTBURN  AND  INDIGESTION. 90 capsule 0   rOPINIRole (REQUIP) 2 MG tablet Take  1/4 to  1/2 to 1  tablet   3 x /day  for Restless Legs 90 tablet 0   rosuvastatin (CRESTOR) 20 MG tablet Take  1 tablet  Daily  for Cholesterol                                                                     /                                                   TAKE                                 BY                                       MOUTH 90 tablet 3   No current facility-administered medications on file prior to visit.    Allergies  Allergen Reactions   Sulfa Antibiotics    Trazodone And Nefazodone     Severe dreams     DIAGNOSTIC DATA (LABS, IMAGING, TESTING) - I reviewed patient records, labs, notes, testing and imaging myself where available.  Lab Results  Component Value Date   WBC 9.0 12/23/2022   HGB 15.2 12/23/2022   HCT 44.0 12/23/2022   MCV 86.8 12/23/2022   PLT 258 12/23/2022      Component Value Date/Time   NA 138 12/23/2022 1113   K 4.2 12/23/2022 1113   CL 101 12/23/2022 1113   CO2 28 12/23/2022 1113   GLUCOSE 79 12/23/2022 1113   BUN 16 12/23/2022 1113   CREATININE 0.87 12/23/2022 1113   CALCIUM 10.3 12/23/2022 1113   PROT 7.7 12/23/2022 1113   ALBUMIN 4.3 05/02/2017 1208   AST 20 12/23/2022 1113   ALT 27 12/23/2022 1113   ALKPHOS 47 05/02/2017 1208   BILITOT 0.5 12/23/2022 1113   GFRNONAA 104 12/07/2020 1403   GFRAA 120 12/07/2020 1403   Lab Results  Component Value Date   CHOL 179 12/23/2022   HDL 39 (L) 12/23/2022   LDLCALC  12/23/2022     Comment:     . LDL cholesterol not calculated. Triglyceride levels greater than 400 mg/dL invalidate calculated LDL results. . Reference range: <100 . Desirable range <100 mg/dL for primary prevention;   <70 mg/dL for patients with CHD or diabetic patients  with > or = 2 CHD risk factors. Marland Kitchen LDL-C is now calculated using the Martin-Hopkins  calculation, which is a validated novel method providing  better accuracy than the Friedewald equation in the  estimation of LDL-C.  Horald Pollen et al. Lenox Ahr. 1610;960(45):  2061-2068  (http://education.[facility].com/faq/FAQ164)    TRIG 451 (H) 12/23/2022   CHOLHDL 4.6 12/23/2022   Lab Results  Component Value Date   HGBA1C 5.7 (H) 12/23/2022   Lab Results  Component Value Date   VITAMINB12 490 12/23/2022   Lab Results  Component Value Date   TSH 1.82 12/23/2022    PHYSICAL EXAM:  Today's  Vitals   03/12/23 1129  BP: 112/73  Pulse: 72  Weight: 201 lb (91.2 kg)  Height: 5\' 10"  (1.778 m)   Body mass index is 28.84 kg/m.   Wt Readings from Last 3 Encounters:  03/12/23 201 lb (91.2 kg)  12/23/22 202 lb 9.6 oz (91.9 kg)  06/27/22 188 lb 3.2 oz (85.4 kg)     Ht Readings from Last 3 Encounters:  03/12/23 5\' 10"  (1.778 m)  12/23/22 5' 10.5" (1.791 m)  06/27/22 5' 10.5" (1.791 m)      General: The patient is awake, alert and appears not in acute distress. The patient is well groomed. Head: Normocephalic, atraumatic. Neck is supple. Mallampati 3,  neck circumference:16.25 inches . Nasal airflow left side patent.  Retrognathia is mildly seen.  Dental status: biological Cardiovascular:  Regular rate and cardiac rhythm by pulse,  without distended neck veins. Respiratory: Lungs are clear to auscultation.  Skin:  Without evidence of ankle edema, or rash. Trunk: The patient's posture is erect.   NEUROLOGIC EXAM: The patient is awake and alert, oriented to place and time.   Memory subjective described as intact.  Attention span & concentration ability appears normal.  Speech is fluent,  without  dysarthria, dysphonia or aphasia.  Mood and affect are appropriate.   Cranial nerves: no loss of smell or taste reported  Pupils are equal and briskly reactive to light. Funduscopic exam def.  Extraocular movements in vertical and horizontal planes were intact and without nystagmus. No Diplopia. Visual fields by finger perimetry are intact. Hearing was intact to soft voice and finger rubbing.    Facial sensation intact to fine touch.  Facial  motor strength is symmetric and tongue and uvula move midline.  Neck ROM : rotation, tilt and flexion extension were normal for age and shoulder shrug was symmetrical.    Motor exam:  Symmetric bulk, tone and ROM.   Normal tone without cog wheeling, symmetric grip strength .   Sensory:  normal. History of right hand numbness in 2020, now improved.    Coordination: Rapid alternating movements in the fingers/hands were of normal speed.  Gait and station: Patient could rise unassisted from a seated position, walked without assistive device.  Stance is of normal width/ base .  Toe and heel walk were deferred.  Deep tendon reflexes: in the  upper and lower extremities are symmetric and intact.  Babinski response was deferred    ASSESSMENT AND PLAN 42 y.o. year old male  here with:   0) OSA , confirmed by outside sleep study.  Severe and should be treated with CPAP _ Did I receive these SNAP results for initiation of treatment  based on outside sleep study?  I would suggest to the physician who ordered the study to follow up with order an  autotitration study with CPAP 5-20 cm water, 3 cm EPR and mask of his choice, humidifier.  1) neck and shoulder tension 2) chronic insomnia,  worsened by high volume alcohol use and causing also nocturia and louder snoring.    I plan to follow up either personally or through our NP within 4 months.   I would like to thank Lucky Cowboy, MD  for allowing me to meet with and to take care of this pleasant patient but I will not resume care on tests results performed outside of this sleep center.     After spending a total time of  35  minutes face to face and additional time for  physical and neurologic examination, review of laboratory studies,  personal review of imaging studies, reports and results of other testing and review of referral information / records as far as provided in visit,   Electronically signed by: Melvyn Novas, MD 03/12/2023 11:48  AM  [facility] Board certified by The [employer] of Sleep Medicine and Diplomate of the [employer] of Sleep Medicine. Board certified In Neurology through the ABPN, Fellow of the [employer] of Neu[employer]. Medical Director of [facility].

## 2023-03-13 ENCOUNTER — Other Ambulatory Visit: Payer: Self-pay | Admitting: Nurse Practitioner

## 2023-03-13 DIAGNOSIS — K219 Gastro-esophageal reflux disease without esophagitis: Secondary | ICD-10-CM

## 2023-03-24 ENCOUNTER — Other Ambulatory Visit: Payer: Self-pay | Admitting: Internal Medicine

## 2023-03-24 ENCOUNTER — Encounter: Payer: Self-pay | Admitting: Internal Medicine

## 2023-03-24 DIAGNOSIS — G4733 Obstructive sleep apnea (adult) (pediatric): Secondary | ICD-10-CM

## 2023-03-24 DIAGNOSIS — G478 Other sleep disorders: Secondary | ICD-10-CM

## 2023-03-25 ENCOUNTER — Other Ambulatory Visit: Payer: Self-pay | Admitting: Nurse Practitioner

## 2023-03-25 ENCOUNTER — Other Ambulatory Visit: Payer: Self-pay | Admitting: Internal Medicine

## 2023-03-25 DIAGNOSIS — F419 Anxiety disorder, unspecified: Secondary | ICD-10-CM

## 2023-03-25 DIAGNOSIS — G2581 Restless legs syndrome: Secondary | ICD-10-CM

## 2023-04-02 ENCOUNTER — Ambulatory Visit (INDEPENDENT_AMBULATORY_CARE_PROVIDER_SITE_OTHER): Payer: BC Managed Care – PPO | Admitting: Nurse Practitioner

## 2023-04-02 ENCOUNTER — Encounter: Payer: Self-pay | Admitting: Nurse Practitioner

## 2023-04-02 VITALS — BP 110/80 | HR 75 | Temp 98.0°F | Ht 70.0 in | Wt 199.4 lb

## 2023-04-02 DIAGNOSIS — I10 Essential (primary) hypertension: Secondary | ICD-10-CM

## 2023-04-02 DIAGNOSIS — F419 Anxiety disorder, unspecified: Secondary | ICD-10-CM

## 2023-04-02 DIAGNOSIS — E782 Mixed hyperlipidemia: Secondary | ICD-10-CM

## 2023-04-02 DIAGNOSIS — R7309 Other abnormal glucose: Secondary | ICD-10-CM | POA: Diagnosis not present

## 2023-04-02 DIAGNOSIS — F32A Depression, unspecified: Secondary | ICD-10-CM

## 2023-04-02 DIAGNOSIS — K219 Gastro-esophageal reflux disease without esophagitis: Secondary | ICD-10-CM | POA: Diagnosis not present

## 2023-04-02 DIAGNOSIS — G4733 Obstructive sleep apnea (adult) (pediatric): Secondary | ICD-10-CM

## 2023-04-02 DIAGNOSIS — Z79899 Other long term (current) drug therapy: Secondary | ICD-10-CM

## 2023-04-02 DIAGNOSIS — F10982 Alcohol use, unspecified with alcohol-induced sleep disorder: Secondary | ICD-10-CM

## 2023-04-02 NOTE — Progress Notes (Signed)
FOLLOW UP  Assessment and Plan:   Essential hypertension Controlled - no changes to medications. Continue to take as directed.  Monitor BP at home-Call if greater than 130/80.  Continue medications; Hydrodiuril, Benicar. Discussed DASH (Dietary Approaches to Stop Hypertension) DASH diet is lower in sodium than a typical American diet. Cut back on foods that are high in saturated fat, cholesterol, and trans fats. Eat more whole-grain foods, fish, poultry, and nuts Remain active and exercise as tolerated daily.   Gastroesophageal reflux disease without esophagitis Controlled. No suspected reflux complications (Barret/stricture). Lifestyle modification:  wt loss, avoid meals 2-3h before bedtime. Consider eliminating food triggers:  chocolate, caffeine, EtOH, acid/spicy food.  Mixed hyperlipidemia Uncontrolled - Triglycerides elevated. Continue medications; Rosuvastatin Discussed lifestyle modifications. Recommended diet heavy in fruits and veggies, omega 3's. Decrease consumption of animal meats, cheeses, alcohol and dairy products. Remain active and exercise as tolerated. Continue to monitor.  Anxiety and depression Controlled. Continue Xanax PRN Continue Buspar, Lexapro. Reviewed relaxation techniques.  Sleep hygiene. Recommended Cognitive Behavioral Therapy (CBT) can be a resource if needed. Encouraged personality growth wand development through coping techniques and problem-solving skills. Limit/Decrease/Monitor drug/alcohol intake.    Alcohol Use Continue to monitor  OSA Pending referral to [facility] Pulmonology, Dr. Vassie Loll Patient provided contact number to reach out for update.  Abnormal glucose Education: Reviewed 'ABCs' of diabetes management  Discussed goals to be met and/or maintained include A1C (<7) Blood pressure (<130/80) Cholesterol (LDL <70) Continue Eye Exam yearly  Continue Dental Exam Q6 mo Discussed dietary recommendations Discussed Physical  Activity recommendations Check A1C  Medication management All medications discussed and reviewed in full. All questions and concerns regarding medications addressed.    Orders Placed This Encounter  Procedures   CBC with Differential/Platelet   COMPLETE METABOLIC PANEL WITH GFR   Lipid panel   Hemoglobin A1c    Notify office for further evaluation and treatment, questions or concerns if any reported s/s fail to improve.   The patient was advised to call back or seek an in-person evaluation if any symptoms worsen or if the condition fails to improve as anticipated.   Further disposition pending results of labs. Discussed med's effects and SE's.    I discussed the assessment and treatment plan with the patient. The patient was provided an opportunity to ask questions and all were answered. The patient agreed with the plan and demonstrated an understanding of the instructions.  Discussed med's effects and SE's. Screening labs and tests as requested with regular follow-up as recommended.  I provided 25 minutes of face-to-face time during this encounter including counseling, chart review, and critical decision making was preformed.  Today's Plan of Care is based on a patient-centered health care approach known as shared decision making - the decisions, tests and treatments allow for patient preferences and values to be balanced with clinical evidence.     Future Appointments  Date Time Provider Department Center  07/08/2023  9:30 AM Lucky Cowboy, MD [facility] None  10/14/2023  9:30 AM Adela Glimpse, NP [facility] None  01/13/2024 10:00 AM Lucky Cowboy, MD [facility] None    ----------------------------------------------------------------------------------------------------------------------  HPI 42 y.o. male  presents for 3 month follow up on hypertension, cholesterol, diabetes, weight and vitamin D deficiency.   Recently had home sleep study that revealed severe sleep  apnea.  He has a referral pending with Dr. Vassie Loll, [facility]monology for CPAP machine.  He had a past hx of feeling fatigue though to be associated with the amount of blood  pressure medication he is taking.  BP currently well controlled on medications.  BMI is Body mass index is 28.61 kg/m., he has not been working on diet and exercise. Wt Readings from Last 3 Encounters:  04/02/23 199 lb 6.4 oz (90.4 kg)  03/12/23 201 lb (91.2 kg)  12/23/22 202 lb 9.6 oz (91.9 kg)   His blood pressure has been controlled at home, today their BP is BP: 110/80  He does not workout. He denies chest pain, shortness of breath, dizziness.   He is on cholesterol medication Rosuvastatin and denies myalgias. His cholesterol is not at goal. He does have a hx of alcohol use. The cholesterol last visit was:   Lab Results  Component Value Date   CHOL 179 12/23/2022   HDL 39 (L) 12/23/2022   LDLCALC  12/23/2022     Comment:     . LDL cholesterol not calculated. Triglyceride levels greater than 400 mg/dL invalidate calculated LDL results. . Reference range: <100 . Desirable range <100 mg/dL for primary prevention;   <70 mg/dL for patients with CHD or diabetic patients  with > or = 2 CHD risk factors. Marland Kitchen LDL-C is now calculated using the Martin-Hopkins  calculation, which is a validated novel method providing  better accuracy than the Friedewald equation in the  estimation of LDL-C.  Horald Pollen et al. Lenox Ahr. 1610;960(45): 2061-2068  (http://education.[employer].com/faq/FAQ164)    TRIG 451 (H) 12/23/2022   CHOLHDL 4.6 12/23/2022    He has not been working on diet and exercise for prediabetes, and denies polydipsia and polyuria. Last A1C in the office was:  Lab Results  Component Value Date   HGBA1C 5.7 (H) 12/23/2022   Patient is on Vitamin D supplement.   Lab Results  Component Value Date   VD25OH 62 12/23/2022      Current Medications:  Current Outpatient Medications on File Prior to Visit   Medication Sig   amitriptyline (ELAVIL) 50 MG tablet Take  1 tablet  3 x /day  for Chronic Neck Pain   b complex vitamins capsule Take 1 capsule by mouth daily.   busPIRone (BUSPAR) 7.5 MG tablet TAKE 1 TABLET BY MOUTH THREE TIMES DAILY   butalbital-acetaminophen-caffeine (FIORICET) 50-325-40 MG tablet Take 1 tablet  every 4 hours  as Needed  for Headache   Cholecalciferol (VITAMIN D-3) 5000 UNITS TABS Take 2 tablets by mouth daily. Taking 10,000 units   escitalopram (LEXAPRO) 20 MG tablet Take  1 tablet  Daily  for Mood & Chronic Anxiety                                                         /                                                          take                                              by  mouth                                                 once ?  ? ? ? daily   hydrochlorothiazide (HYDRODIURIL) 25 MG tablet Take  1 tablet  Daily for BP & Fluid Retention                                               /                                       TAKE                                               BY                                   MOUTH                            ONCE DAILY   Magnesium 250 MG TABS Take 1 tablet by mouth daily.    nadolol (CORGARD) 20 MG tablet Take 1/2 to 1 tablet  Daily for BP   olmesartan (BENICAR) 20 MG tablet Take  1 tablet  Daily    for BP                                      /                         TAKE                               BY              MOUTH                        ONCE DAILY   Omega-3 Fatty Acids (FISH OIL PO) Take 300 mg by mouth daily.   omeprazole (PRILOSEC) 40 MG capsule TAKE 1 CAPSULE BY MOUTH ONCE DAILY TO  PREVENT  HEARTBURN  AND  INDIGESTION.   rOPINIRole (REQUIP) 2 MG tablet TAKE 1/4 TO 1/2 TO 1 TABLET THREE TIMES DAILY FOR RESTLESS LEGS.   rosuvastatin (CRESTOR) 20 MG tablet Take  1 tablet  Daily  for Cholesterol                                                                     /  TAKE                                 BY                                       MOUTH   No current facility-administered medications on file prior to visit.     Allergies:  Allergies  Allergen Reactions   Sulfa Antibiotics    Trazodone And Nefazodone     Severe dreams     Medical History:  Past Medical History:  Diagnosis Date   Anxiety    HLD (hyperlipidemia)    Insomnia    Lumbago    Tension headache    Unspecified vitamin D deficiency    Family history- Reviewed and unchanged Social history- Reviewed and unchanged  Review of Systems: A complete ROS was performed with pertinent positives/negatives noted in the HPI. The remainder of the ROS are negative.  Physical Exam: BP 110/80   Pulse 75   Temp 98 F (36.7 C)   Ht 5\' 10"  (1.778 m)   Wt 199 lb 6.4 oz (90.4 kg)   SpO2 98%   BMI 28.61 kg/m  Wt Readings from Last 3 Encounters:  04/02/23 199 lb 6.4 oz (90.4 kg)  03/12/23 201 lb (91.2 kg)  12/23/22 202 lb 9.6 oz (91.9 kg)   General Appearance: Well nourished, in no apparent distress. Eyes: PERRLA, EOMs, conjunctiva no swelling or erythema Sinuses: No Frontal/maxillary tenderness ENT/Mouth: Ext aud canals clear, TMs without erythema, bulging. No erythema, swelling, or exudate on post pharynx.  Tonsils not swollen or erythematous. Hearing normal.  Neck: Supple, thyroid normal.  Respiratory: Respiratory effort normal, BS equal bilaterally without rales, rhonchi, wheezing or stridor.  Cardio: RRR with no MRGs. Brisk peripheral pulses without edema.  Abdomen: Soft, + BS.  Non tender, no guarding, rebound, hernias, masses. Lymphatics: Non tender without lymphadenopathy.  Musculoskeletal: Full ROM, 5/5 strength, Normal gait Skin: Warm, dry without rashes, lesions, ecchymosis.  Neuro: Cranial nerves intact. No cerebellar symptoms.  Psych: Awake and oriented X 3, normal affect, Insight and Judgment appropriate.    Adela Glimpse, NP 9:59  AM [facility]

## 2023-04-02 NOTE — Patient Instructions (Signed)
Living With Sleep Apnea Sleep apnea is a condition in which breathing pauses or becomes shallow during sleep. Sleep apnea is most commonly caused by a collapsed or blocked airway. People with sleep apnea usually snore loudly. They may have times when they gasp and stop breathing for 10 seconds or more during sleep. This may happen many times during the night. The breaks in breathing also interrupt the deep sleep that you need to feel rested. Even if you do not completely wake up from the gaps in breathing, your sleep may not be restful and you feel tired during the day. You may also have a headache in the morning and low energy during the day, and you may feel anxious or depressed. How can sleep apnea affect me? Sleep apnea increases your chances of extreme tiredness during the day (daytime fatigue). It can also increase your risk for health conditions, such as: Heart attack. Stroke. Obesity. Type 2 diabetes. Heart failure. Irregular heartbeat. High blood pressure. If you have daytime fatigue as a result of sleep apnea, you may be more likely to: Perform poorly at school or work. Fall asleep while driving. Have difficulty with attention. Develop depression or anxiety. Have sexual dysfunction. What actions can I take to manage sleep apnea? Sleep apnea treatment  If you were given a device to open your airway while you sleep, use it only as told by your health care provider. You may be given: An oral appliance. This is a custom-made mouthpiece that shifts your lower jaw forward. A continuous positive airway pressure (CPAP) device. This device blows air through a mask when you breathe out (exhale). A nasal expiratory positive airway pressure (EPAP) device. This device has valves that you put into each nostril. A bi-level positive airway pressure (BIPAP) device. This device blows air through a mask when you breathe in (inhale) and breathe out (exhale). You may need surgery if other treatments  do not work for you. Sleep habits Go to sleep and wake up at the same time every day. This helps set your internal clock (circadian rhythm) for sleeping. If you stay up later than usual, such as on weekends, try to get up in the morning within 2 hours of your normal wake time. Try to get at least 7-9 hours of sleep each night. Stop using a computer, tablet, and mobile phone a few hours before bedtime. Do not take long naps during the day. If you nap, limit it to 30 minutes. Have a relaxing bedtime routine. Reading or listening to music may relax you and help you sleep. Use your bedroom only for sleep. Keep your television and computer out of your bedroom. Keep your bedroom cool, dark, and quiet. Use a supportive mattress and pillows. Follow your health care provider's instructions for other changes to sleep habits. Nutrition Do not eat heavy meals in the evening. Do not have caffeine in the later part of the day. The effects of caffeine can last for more than 5 hours. Follow your health care provider's or dietitian's instructions for any diet changes. Lifestyle     Do not drink alcohol before bedtime. Alcohol can cause you to fall asleep at first, but then it can cause you to wake up in the middle of the night and have trouble getting back to sleep. Do not use any products that contain nicotine or tobacco. These products include cigarettes, chewing tobacco, and vaping devices, such as e-cigarettes. If you need help quitting, ask your health care provider. Medicines Take   over-the-counter and prescription medicines only as told by your health care provider. Do not use over-the-counter sleep medicine. You can become dependent on this medicine, and it can make sleep apnea worse. Do not use medicines, such as sedatives and narcotics, unless told by your health care provider. Activity Exercise on most days, but avoid exercising in the evening. Exercising near bedtime can interfere with  sleeping. If possible, spend time outside every day. Natural light helps regulate your circadian rhythm. General information Lose weight if you need to, and maintain a healthy weight. Keep all follow-up visits. This is important. If you are having surgery, make sure to tell your health care provider that you have sleep apnea. You may need to bring your device with you. Where to find more information Learn more about sleep apnea and daytime fatigue from: American Sleep Association: sleepassociation.org National Sleep Foundation: sleepfoundation.org National Heart, Lung, and Blood Institute: nhlbi.nih.gov Summary Sleep apnea is a condition in which breathing pauses or becomes shallow during sleep. Sleep apnea can cause daytime fatigue and other serious health conditions. You may need to wear a device while sleeping to help keep your airway open. If you are having surgery, make sure to tell your health care provider that you have sleep apnea. You may need to bring your device with you. Making changes to sleep habits, diet, lifestyle, and activity can help you manage sleep apnea. This information is not intended to replace advice given to you by your health care provider. Make sure you discuss any questions you have with your health care provider. Document Revised: 04/25/2021 Document Reviewed: 08/25/2020 Elsevier Patient Education  2023 Elsevier Inc.  

## 2023-04-03 LAB — LIPID PANEL
Cholesterol: 169 mg/dL (ref <200)
HDL: 44 mg/dL (ref >=40)
LDL Cholesterol (Calc): 87 mg/dL (calc)
Non-HDL Cholesterol (Calc): 125 mg/dL (calc) (ref <130)
Total CHOL/HDL Ratio: 3.8 (calc) (ref <5.0)
Triglycerides: 290 mg/dL — ABNORMAL HIGH (ref <150)

## 2023-04-03 LAB — COMPLETE METABOLIC PANEL WITH GFR
AG Ratio: 1.6 (calc) (ref 1.0–2.5)
ALT: 27 U/L (ref 9–46)
AST: 14 U/L (ref 10–40)
Albumin: 5 g/dL (ref 3.6–5.1)
Alkaline phosphatase (APISO): 75 U/L (ref 36–130)
BUN: 15 mg/dL (ref 7–25)
CO2: 31 mmol/L (ref 20–32)
Calcium: 11.3 mg/dL — ABNORMAL HIGH (ref 8.6–10.3)
Chloride: 100 mmol/L (ref 98–110)
Creat: 0.98 mg/dL (ref 0.60–1.29)
Globulin: 3.1 g/dL (calc) (ref 1.9–3.7)
Glucose, Bld: 97 mg/dL (ref 65–99)
Potassium: 4.6 mmol/L (ref 3.5–5.3)
Sodium: 139 mmol/L (ref 135–146)
Total Bilirubin: 0.5 mg/dL (ref 0.2–1.2)
Total Protein: 8.1 g/dL (ref 6.1–8.1)
eGFR: 99 mL/min/{1.73_m2} (ref >=60)

## 2023-04-03 LAB — CBC WITH DIFFERENTIAL/PLATELET
Absolute Monocytes: 792 cells/uL (ref 200–950)
Basophils Absolute: 63 cells/uL (ref 0–200)
Basophils Relative: 0.7 %
Eosinophils Absolute: 360 cells/uL (ref 15–500)
Eosinophils Relative: 4 %
HCT: 47.3 % (ref 38.5–50.0)
Hemoglobin: 16.3 g/dL (ref 13.2–17.1)
Lymphs Abs: 2988 cells/uL (ref 850–3900)
MCH: 30.8 pg (ref 27.0–33.0)
MCHC: 34.5 g/dL (ref 32.0–36.0)
MCV: 89.2 fL (ref 80.0–100.0)
MPV: 9.5 fL (ref 7.5–12.5)
Monocytes Relative: 8.8 %
Neutro Abs: 4797 cells/uL (ref 1500–7800)
Neutrophils Relative %: 53.3 %
Platelets: 291 10*3/uL (ref 140–400)
RBC: 5.3 10*6/uL (ref 4.20–5.80)
RDW: 12.6 % (ref 11.0–15.0)
Total Lymphocyte: 33.2 %
WBC: 9 10*3/uL (ref 3.8–10.8)

## 2023-04-03 LAB — HEMOGLOBIN A1C
Hgb A1c MFr Bld: 5.6 % of total Hgb (ref <5.7)
Mean Plasma Glucose: 114 mg/dL
eAG (mmol/L): 6.3 mmol/L

## 2023-04-11 ENCOUNTER — Other Ambulatory Visit: Payer: Self-pay

## 2023-04-11 DIAGNOSIS — I10 Essential (primary) hypertension: Secondary | ICD-10-CM

## 2023-04-11 MED ORDER — OLMESARTAN MEDOXOMIL 20 MG PO TABS
ORAL_TABLET | ORAL | 3 refills | Status: AC
Start: 2023-04-11 — End: ?

## 2023-04-24 DIAGNOSIS — G4733 Obstructive sleep apnea (adult) (pediatric): Secondary | ICD-10-CM | POA: Diagnosis not present

## 2023-05-14 DIAGNOSIS — G4733 Obstructive sleep apnea (adult) (pediatric): Secondary | ICD-10-CM | POA: Diagnosis not present

## 2023-05-22 ENCOUNTER — Institutional Professional Consult (permissible substitution) (HOSPITAL_BASED_OUTPATIENT_CLINIC_OR_DEPARTMENT_OTHER): Payer: BC Managed Care – PPO | Admitting: Pulmonary Disease

## 2023-06-14 DIAGNOSIS — G4733 Obstructive sleep apnea (adult) (pediatric): Secondary | ICD-10-CM | POA: Diagnosis not present

## 2023-07-08 ENCOUNTER — Other Ambulatory Visit: Payer: Self-pay | Admitting: Nurse Practitioner

## 2023-07-08 ENCOUNTER — Ambulatory Visit: Payer: BC Managed Care – PPO | Admitting: Internal Medicine

## 2023-07-08 DIAGNOSIS — K219 Gastro-esophageal reflux disease without esophagitis: Secondary | ICD-10-CM

## 2023-07-08 DIAGNOSIS — F419 Anxiety disorder, unspecified: Secondary | ICD-10-CM

## 2023-07-09 DIAGNOSIS — G4733 Obstructive sleep apnea (adult) (pediatric): Secondary | ICD-10-CM | POA: Diagnosis not present

## 2023-07-09 DIAGNOSIS — Z9989 Dependence on other enabling machines and devices: Secondary | ICD-10-CM | POA: Diagnosis not present

## 2023-07-09 DIAGNOSIS — G4710 Hypersomnia, unspecified: Secondary | ICD-10-CM | POA: Diagnosis not present

## 2023-07-14 DIAGNOSIS — G4733 Obstructive sleep apnea (adult) (pediatric): Secondary | ICD-10-CM | POA: Diagnosis not present

## 2023-07-28 ENCOUNTER — Encounter: Payer: Self-pay | Admitting: Internal Medicine

## 2023-07-28 ENCOUNTER — Ambulatory Visit: Payer: BC Managed Care – PPO | Admitting: Internal Medicine

## 2023-07-28 VITALS — BP 110/74 | HR 66 | Temp 97.5°F | Resp 16 | Ht 70.5 in | Wt 203.2 lb

## 2023-07-28 DIAGNOSIS — R7309 Other abnormal glucose: Secondary | ICD-10-CM | POA: Diagnosis not present

## 2023-07-28 DIAGNOSIS — Z79899 Other long term (current) drug therapy: Secondary | ICD-10-CM | POA: Diagnosis not present

## 2023-07-28 DIAGNOSIS — I10 Essential (primary) hypertension: Secondary | ICD-10-CM

## 2023-07-28 DIAGNOSIS — E782 Mixed hyperlipidemia: Secondary | ICD-10-CM

## 2023-07-28 DIAGNOSIS — E559 Vitamin D deficiency, unspecified: Secondary | ICD-10-CM | POA: Diagnosis not present

## 2023-07-28 NOTE — Patient Instructions (Signed)

## 2023-07-28 NOTE — Progress Notes (Signed)
R  E  S  C  H  E  D  U  L  E  D  Patient received a call about a sick child & had to leave                                                                                                                                             Future Appointments  Date Time Provider Department  07/28/2023                     6 mo ov  3:30 PM Lucky Cowboy, MD [facility]  10/14/2023                       9 mo ov  9:30 AM Adela Glimpse, NP [facility]  01/13/2024                       cpe 10:00 AM Lucky Cowboy, MD [facility]    History of Present Illness:       This very nice 42 y.o. single WM  with HTN, HLD, Pre-Diabetes and Vitamin D Deficiency  presents for 6  month follow up .        Patient is treated for HTN  since 2012   & BP has been controlled  on Nadolol, Olmesartan & HCTZ  at home. Today's BP is at goal -                           .   Patient has had no complaints of any cardiac type chest pain, palpitations, dyspnea Pollyann Kennedy /PND, dizziness, claudication or dependent edema.        Hyperlipidemia is controlled with diet &  Rosuvastatin. Patient denies myalgias or other med SE's. Last Lipids were at goal :  Lab Results  Component Value Date   CHOL 169 04/02/2023   HDL 44 04/02/2023   LDLCALC 87 04/02/2023   TRIG 290 (H) 04/02/2023   CHOLHDL 3.8 04/02/2023     Also, the patient has history of T2_NIDDM PreDiabetes and has had no symptoms of reactive hypoglycemia, diabetic polys, paresthesias or visual blurring.  Last A1c was normal  at goal :  Lab Results  Component Value Date   HGBA1C 5.6 04/02/2023                                                        Further, the patient also has history of Vitamin D Deficiency and supplements vitamin D . Last vitamin D was at goal :   Lab Results  Component Value Date  VD25OH 62  12/23/2022       Current Outpatient Medications  Medication Instructions   amitriptyline 50 MG tablet Take  1 tablet  3 x /day  for Chronic Neck Pain   b complex vitamins  1 capsule  Daily   busPIRone    7.5 mg  3 times daily   FIORICET  Take 1 tab  every 4 hours  as Needed  for Headache   VITAMIN D 5000 u 2 tablets  Daily -  10,000 units   escitalopram  20 MG tablet Take  1 tablet  Daily   hydrochlorothiazide  25 MG tablet Take  1 tablet  Daily    Magnesium 250 MG TABS 1 tablet, Oral, Daily   nadolol20 MG tablet Take 1/2 to 1 tablet  Daily for BP   olmesartan 20 MG tablet Take 1 tablet Daily for BP   Omega-3 FISH OIL  300 mg Daily   omeprazole 40 MG capsule TAKE 1 CAPSULE  DAILY    rOPINIRole 2 MG tablet TAKE 1/4 TO 1/2 TO 1 TABLET THREE TIMES DAILY    rosuvastatin  20 MG tablet Take  1 tablet  Daily       Allergies  Allergen Reactions   Sulfa Antibiotics    Trazodone And Nefazodone     Severe dreams     PMHx:   Past Medical History:  Diagnosis Date   Anxiety    HLD (hyperlipidemia)    Insomnia    Lumbago    Tension headache    Unspecified vitamin D deficiency      Immunization History  Administered Date(s) Administered   Influenza Inj Mdck Quad  07/30/2017, 08/25/2018   Influenza, Seasonal 09/07/2015   PPD Test 06/01/2014, 06/06/2015, 06/28/2016, 07/30/2017, 10/04/2019, 12/07/2020, 12/17/2021   Tdap 10/21/2013     No past surgical history on file.  FHx:    Reviewed / unchanged  SHx:    Reviewed / unchanged   Systems Review:  Constitutional: Denies fever, chills, wt changes, headaches, insomnia, fatigue, night sweats, change in appetite. Eyes: Denies redness, blurred vision, diplopia, discharge, itchy, watery eyes.  ENT: Denies discharge, congestion, post nasal drip, epistaxis, sore throat, earache, hearing loss, dental pain, tinnitus, vertigo, sinus pain, snoring.  CV: Denies chest pain, palpitations, irregular heartbeat, syncope, dyspnea, diaphoresis,  orthopnea, PND, claudication or edema. Respiratory: denies cough, dyspnea, DOE, pleurisy, hoarseness, laryngitis, wheezing.  Gastrointestinal: Denies dysphagia, odynophagia, heartburn, reflux, water brash, abdominal pain or cramps, nausea, vomiting, bloating, diarrhea, constipation, hematemesis, melena, hematochezia  or hemorrhoids. Genitourinary: Denies dysuria, frequency, urgency, nocturia, hesitancy, discharge, hematuria or flank pain. Musculoskeletal: Denies arthralgias, myalgias, stiffness, jt. swelling, pain, limping or strain/sprain.  Skin: Denies pruritus, rash, hives, warts, acne, eczema or change in skin lesion(s). Neuro: No weakness, tremor, incoordination, spasms, paresthesia or pain. Psychiatric: Denies confusion, memory loss or sensory loss. Endo: Denies change in weight, skin or hair change.  Heme/Lymph: No excessive bleeding, bruising or enlarged lymph nodes.  Physical Exam  There were no vitals taken for this visit.  Appears  well nourished, well groomed  and in no distress.  Eyes: PERRLA, EOMs, conjunctiva no swelling or erythema. Sinuses: No frontal/maxillary tenderness ENT/Mouth: EAC's clear, TM's nl w/o erythema, bulging. Nares clear w/o erythema, swelling, exudates. Oropharynx clear without erythema or exudates. Oral hygiene is good. Tongue normal, non obstructing. Hearing intact.  Neck: Supple. Thyroid not palpable. Car 2+/2+ without bruits, nodes or JVD. Chest: Respirations nl with BS clear & equal w/o rales, rhonchi, wheezing  or stridor.  Cor: Heart sounds normal w/ regular rate and rhythm without sig. murmurs, gallops, clicks or rubs. Peripheral pulses normal and equal  without edema.  Abdomen: Soft & bowel sounds normal. Non-tender w/o guarding, rebound, hernias, masses or organomegaly.  Lymphatics: Unremarkable.  Musculoskeletal: Full ROM all peripheral extremities, joint stability, 5/5 strength and normal gait.  Skin: Warm, dry without exposed rashes, lesions or  ecchymosis apparent.  Neuro: Cranial nerves intact, reflexes equal bilaterally. Sensory-motor testing grossly intact. Tendon reflexes grossly intact.  Pysch: Alert & oriented x 3.  Insight and judgement nl & appropriate. No ideations.   Assessment and Plan:   1. Essential hypertension  - Continue medication, monitor blood pressure at home.  - Continue DASH diet.  Reminder to go to the ER if any CP,  SOB, nausea, dizziness, severe HA, changes vision/speech.   - CBC with Differential/Platelet - COMPLETE METABOLIC PANEL WITH GFR - Magnesium - TSH  2. Hyperlipidemia, mixed  - Continue diet/meds, exercise,& lifestyle modifications.  - Continue monitor periodic cholesterol/liver & renal functions    - Lipid panel - TSH  3. Abnormal glucose  - Continue diet, exercise  - Lifestyle modifications.  - Monitor appropriate labs   - Hemoglobin A1c - Insulin, random  4. Vitamin D deficiency  - Continue supplementation   - VITAMIN D 25 Hydroxy   5. Medication management  - CBC with Differential/Platelet - COMPLETE METABOLIC PANEL WITH GFR - Magnesium - Lipid panel - TSH - Hemoglobin A1c - Insulin, random - VITAMIN D 25 Hydroxy          Discussed  regular exercise, BP monitoring, weight control to achieve/maintain BMI less than 25 and discussed med and SE's. Recommended labs to assess /monitor clinical status .  I discussed the assessment and treatment plan with the patient. The patient was provided an opportunity to ask questions and all were answered. The patient agreed with the plan and demonstrated an understanding of the instructions.  I provided over 30 minutes of exam, counseling, chart review and  complex critical decision making.        The patient was advised to call back or seek an in-person evaluation if the symptoms worsen or if the condition fails to improve as anticipated.   Marinus Maw, MD

## 2023-07-29 LAB — CBC WITH DIFFERENTIAL/PLATELET
Absolute Lymphocytes: 2978 {cells}/uL (ref 850–3900)
Absolute Monocytes: 922 {cells}/uL (ref 200–950)
Basophils Absolute: 49 {cells}/uL (ref 0–200)
Basophils Relative: 0.5 %
Eosinophils Absolute: 330 {cells}/uL (ref 15–500)
Eosinophils Relative: 3.4 %
HCT: 45.3 % (ref 38.5–50.0)
Hemoglobin: 15.4 g/dL (ref 13.2–17.1)
MCH: 30.2 pg (ref 27.0–33.0)
MCHC: 34 g/dL (ref 32.0–36.0)
MCV: 88.8 fL (ref 80.0–100.0)
MPV: 10.1 fL (ref 7.5–12.5)
Monocytes Relative: 9.5 %
Neutro Abs: 5422 {cells}/uL (ref 1500–7800)
Neutrophils Relative %: 55.9 %
Platelets: 267 10*3/uL (ref 140–400)
RBC: 5.1 10*6/uL (ref 4.20–5.80)
RDW: 12.3 % (ref 11.0–15.0)
Total Lymphocyte: 30.7 %
WBC: 9.7 10*3/uL (ref 3.8–10.8)

## 2023-07-29 LAB — MAGNESIUM: Magnesium: 2.1 mg/dL (ref 1.5–2.5)

## 2023-07-29 LAB — HEMOGLOBIN A1C
Hgb A1c MFr Bld: 5.7 %{Hb} — ABNORMAL HIGH (ref <5.7)
Mean Plasma Glucose: 117 mg/dL
eAG (mmol/L): 6.5 mmol/L

## 2023-07-29 LAB — COMPLETE METABOLIC PANEL WITH GFR
AG Ratio: 1.6 (calc) (ref 1.0–2.5)
ALT: 15 U/L (ref 9–46)
AST: 16 U/L (ref 10–40)
Albumin: 4.7 g/dL (ref 3.6–5.1)
Alkaline phosphatase (APISO): 78 U/L (ref 36–130)
BUN: 12 mg/dL (ref 7–25)
CO2: 28 mmol/L (ref 20–32)
Calcium: 10.1 mg/dL (ref 8.6–10.3)
Chloride: 101 mmol/L (ref 98–110)
Creat: 0.88 mg/dL (ref 0.60–1.29)
Globulin: 3 g/dL (ref 1.9–3.7)
Glucose, Bld: 84 mg/dL (ref 65–99)
Potassium: 3.8 mmol/L (ref 3.5–5.3)
Sodium: 137 mmol/L (ref 135–146)
Total Bilirubin: 0.6 mg/dL (ref 0.2–1.2)
Total Protein: 7.7 g/dL (ref 6.1–8.1)
eGFR: 110 mL/min/{1.73_m2} (ref >=60)

## 2023-07-29 LAB — TSH: TSH: 4.02 m[IU]/L (ref 0.40–4.50)

## 2023-07-29 LAB — VITAMIN D 25 HYDROXY (VIT D DEFICIENCY, FRACTURES): Vit D, 25-Hydroxy: 64 ng/mL (ref 30–100)

## 2023-07-29 LAB — LIPID PANEL
Cholesterol: 167 mg/dL (ref <200)
HDL: 45 mg/dL (ref >=40)
LDL Cholesterol (Calc): 90 mg/dL
Non-HDL Cholesterol (Calc): 122 mg/dL (ref <130)
Total CHOL/HDL Ratio: 3.7 (calc) (ref <5.0)
Triglycerides: 230 mg/dL — ABNORMAL HIGH (ref <150)

## 2023-07-29 LAB — INSULIN, RANDOM: Insulin: 17.1 u[IU]/mL

## 2023-07-29 NOTE — Progress Notes (Signed)
<>*<>*<>*<>*<>*<>*<>*<>*<>*<>*<>*<>*<>*<>*<>*<>*<>*<>*<>*<>*<>*<>*<>*<>*<> <>*<>*<>*<>*<>*<>*<>*<>*<>*<>*<>*<>*<>*<>*<>*<>*<>*<>*<>*<>*<>*<>*<>*<>*<>  -  Test results slightly outside the reference range are not unusual. If there is anything important, I will review this with you,  otherwise it is considered normal test values.  If you have further questions,  please do not hesitate to contact me at the office or via My Chart.   <>*<>*<>*<>*<>*<>*<>*<>*<>*<>*<>*<>*<>*<>*<>*<>*<>*<>*<>*<>*<>*<>*<>*<>*<> <>*<>*<>*<>*<>*<>*<>*<>*<>*<>*<>*<>*<>*<>*<>*<>*<>*<>*<>*<>*<>*<>*<>*<>*<>  -  Chpol = 167 / LDL = 90   Great   - Very low risk for Heart Attack  / Stroke  <>*<>*<>*<>*<>*<>*<>*<>*<>*<>*<>*<>*<>*<>*<>*<>*<>*<>*<>*<>*<>*<>*<>*<>*<> <>*<>*<>*<>*<>*<>*<>*<>*<>*<>*<>*<>*<>*<>*<>*<>*<>*<>*<>*<>*<>*<>*<>*<>*<>  -  But Triglycerides (  = 230 ) or fats in blood are too high                 (   Ideal or  Goal is less than 150  !  )    - Recommend avoid fried & greasy foods,  sweets / candy,   - Avoid white rice  (brown or wild rice or Quinoa is OK),   - Avoid white potatoes  (sweet potatoes are OK)   - Avoid anything made from white flour  - bagels, doughnuts, rolls, buns, biscuits, white and   wheat breads, pizza crust and traditional  pasta made of white flour & egg white  - (vegetarian pasta or spinach or wheat pasta is OK).    - Multi-grain bread is OK - like multi-grain flat bread or  sandwich thins.   - Avoid alcohol in excess.   - Exercise is also important.  <>*<>*<>*<>*<>*<>*<>*<>*<>*<>*<>*<>*<>*<>*<>*<>*<>*<>*<>*<>*<>*<>*<>*<>*<> <>*<>*<>*<>*<>*<>*<>*<>*<>*<>*<>*<>*<>*<>*<>*<>*<>*<>*<>*<>*<>*<>*<>*<>*<>  -  A1c = 5.7% Again  elevated in the borderline and                                                  early or pre-diabetes range which has the same   300% increased risk for heart attack, stroke, cancer and                                     alzheimer- type vascular  dementia as full blown diabetes.   But the good news is that diet, exercise with                                             weight loss can cure the early diabetes at this point.  <>*<>*<>*<>*<>*<>*<>*<>*<>*<>*<>*<>*<>*<>*<>*<>*<>*<>*<>*<>*<>*<>*<>*<>*<> <>*<>*<>*<>*<>*<>*<>*<>*<>*<>*<>*<>*<>*<>*<>*<>*<>*<>*<>*<>*<>*<>*<>*<>*<>  -  Vitamin D = 64 - Great    !       Please keep dosage same   <>*<>*<>*<>*<>*<>*<>*<>*<>*<>*<>*<>*<>*<>*<>*<>*<>*<>*<>*<>*<>*<>*<>*<>*<> <>*<>*<>*<>*<>*<>*<>*<>*<>*<>*<>*<>*<>*<>*<>*<>*<>*<>*<>*<>*<>*<>*<>*<>*<>  -  All Else - CBC - Kidneys - Electrolytes - Liver - Magnesium & Thyroid    - all  Normal / OK  <>*<>*<>*<>*<>*<>*<>*<>*<>*<>*<>*<>*<>*<>*<>*<>*<>*<>*<>*<>*<>*<>*<>*<>*<> <>*<>*<>*<>*<>*<>*<>*<>*<>*<>*<>*<>*<>*<>*<>*<>*<>*<>*<>*<>*<>*<>*<>*<>*<>

## 2023-08-14 DIAGNOSIS — G4733 Obstructive sleep apnea (adult) (pediatric): Secondary | ICD-10-CM | POA: Diagnosis not present

## 2023-08-20 ENCOUNTER — Other Ambulatory Visit: Payer: Self-pay | Admitting: Internal Medicine

## 2023-08-20 DIAGNOSIS — E782 Mixed hyperlipidemia: Secondary | ICD-10-CM

## 2023-08-21 NOTE — Progress Notes (Unsigned)
ADULT & ADOL[facility]CENT INTERNAL MEDICINE  Lucky Cowboy, M.D.        Rance Muir, D.NP      Adela Glimpse, D.NP  [facility] [address]  [place], [place]. [address] Telephone [phone] Telefax [phone]   Future Appointments  Date Time Provider Department  08/22/2023                   6 mo ov 10:00 AM Lucky Cowboy, MD [facility]  10/14/2023                    9 mo ov  9:30 AM Adela Glimpse, NP [facility]  01/13/2024                    cpe 10:00 AM Lucky Cowboy, MD [facility]    History of Present Illness:       This very nice 42 y.o. single WM  with HTN, HLD, Pre-Diabetes and Vitamin D Deficiency  presents for 6  month follow up .        Patient was recently dx'd with severe OSA & instituted on CPAP by Dr Verlee Rossetti - [facility] .       Patient is treated for HTN  since 2012   & BP has been controlled  on Nadolol, Olmesartan & HCTZ  at home. Today's BP is at goal -  130/80.   Patient has had no complaints of any cardiac type chest pain, palpitations, dyspnea Pollyann Kennedy /PND, dizziness, claudication or dependent edema.        Hyperlipidemia is controlled with diet &  Rosuvastatin. Patient denies myalgias or other med SE's. Last Lipids were at goal except slightly elevated Trig's :   Lab Results  Component Value Date   CHOL 167 07/28/2023   HDL 45 07/28/2023   LDLCALC 90 07/28/2023   TRIG 230 (H) 07/28/2023   CHOLHDL 3.7 07/28/2023     Also, the patient is monitored for glucose intolerance  and has had no symptoms of reactive hypoglycemia, diabetic polys, paresthesias or visual blurring.  Last A1c was normal  at goal :  Lab Results  Component Value Date   HGBA1C 5.6 04/02/2023                                                        Further, the patient also has history of Vitamin D Deficiency ("28" /2012)  and supplements vitamin D . Last vitamin D was at goal :  Lab Results  Component  Value Date   VD25OH 62 12/23/2022       Current Outpatient Medications  Medication Instructions   amitriptyline 50 MG tablet Take  1 tablet  3 x /day  for Chronic Neck Pain   b complex vitamins  1 capsule  Daily   busPIRone    7.5 mg  3 times daily   FIORICET  Take 1 tab  every 4 hours  as Needed - Headache   VITAMIN D 5000 u 2 tablets  Daily -  10,000 units   escitalopram  20 MG tablet Take  1 tablet  Daily   hydrochlorothiazide  25 MG  Take  1 tablet  Daily    Magnesium 250 MG TABS 1 tablet, Oral, Daily   nadolol20 MG  tablet Take 1/2 to 1 tablet  Daily for BP   olmesartan 20 MG tablet Take 1 tablet Daily for BP   Omega-3 FISH OIL  300 mg Daily   omeprazole 40 MG capsule TAKE 1 CAPSULE  DAILY    rOPINIRole 2 MG tablet TAKE 1/4 TO 1/2 TO 1 TABLET THREE TIMES DAILY    rosuvastatin  20 MG tablet Take  1 tablet  Daily       Allergies  Allergen Reactions   Sulfa Antibiotics    Trazodone And Nefazodone     Severe dreams     PMHx:   Past Medical History:  Diagnosis Date   Anxiety    HLD (hyperlipidemia)    Insomnia    Lumbago    Tension headache    vitamin D deficiency      Immunization History  Administered Date(s) Administered   Influenza Inj Mdck Quad  07/30/2017, 08/25/2018   Influenza, Seasonal 09/07/2015   PPD Test 06/01/2014, 06/06/2015, 06/28/2016, 07/30/2017, 10/04/2019, 12/07/2020, 12/17/2021   Tdap 10/21/2013     No past surgical history on file.  FHx:    Reviewed / unchanged  SHx:    Reviewed / unchanged   Systems Review:  Constitutional: Denies fever, chills, wt changes, headaches, insomnia, fatigue, night sweats, change in appetite. Eyes: Denies redness, blurred vision, diplopia, discharge, itchy, watery eyes.  ENT: Denies discharge, congestion, post nasal drip, epistaxis, sore throat, earache, hearing loss, dental pain, tinnitus, vertigo, sinus pain, snoring.  CV: Denies chest pain, palpitations, irregular heartbeat, syncope, dyspnea,  diaphoresis, orthopnea, PND, claudication or edema. Respiratory: denies cough, dyspnea, DOE, pleurisy, hoarseness, laryngitis, wheezing.  Gastrointestinal: Denies dysphagia, odynophagia, heartburn, reflux, water brash, abdominal pain or cramps, nausea, vomiting, bloating, diarrhea, constipation, hematemesis, melena, hematochezia  or hemorrhoids. Genitourinary: Denies dysuria, frequency, urgency, nocturia, hesitancy, discharge, hematuria or flank pain. Musculoskeletal: Denies arthralgias, myalgias, stiffness, jt. swelling, pain, limping or strain/sprain.  Skin: Denies pruritus, rash, hives, warts, acne, eczema or change in skin lesion(s). Neuro: No weakness, tremor, incoordination, spasms, paresthesia or pain. Psychiatric: Denies confusion, memory loss or sensory loss. Endo: Denies change in weight, skin or hair change.  Heme/Lymph: No excessive bleeding, bruising or enlarged lymph nodes.  Physical Exam  BP 130/80   Pulse 86   Temp 98 F (36.7 C)   Resp 17   Ht 5' 10.5" (1.791 m)   Wt 208 lb 3.2 oz (94.4 kg)   SpO2 97%   BMI 29.45 kg/m   Appears  well nourished, well groomed  and in no distress.  Eyes: PERRLA, EOMs, conjunctiva no swelling or erythema. Sinuses: No frontal/maxillary tenderness ENT/Mouth: EAC's clear, TM's nl w/o erythema, bulging. Nares clear w/o erythema, swelling, exudates. Oropharynx clear without erythema or exudates. Oral hygiene is good. Tongue normal, non obstructing. Hearing intact.  Neck: Supple. Thyroid not palpable. Car 2+/2+ without bruits, nodes or JVD. Chest: Respirations nl with BS clear & equal w/o rales, rhonchi, wheezing or stridor.  Cor: Heart sounds normal w/ regular rate and rhythm without sig. murmurs, gallops, clicks or rubs. Peripheral pulses normal and equal  without edema.  Abdomen: Soft & bowel sounds normal. Non-tender w/o guarding, rebound, hernias, masses or organomegaly.  Lymphatics: Unremarkable.  Musculoskeletal: Full ROM all  peripheral extremities, joint stability, 5/5 strength and normal gait.  Skin: Warm, dry without exposed rashes, lesions or ecchymosis apparent.  Neuro: Cranial nerves intact, reflexes equal bilaterally. Sensory-motor testing grossly intact. Tendon reflexes grossly intact.  Pysch: Alert & oriented x 3.  Insight and judgement nl & appropriate. No ideations.   Assessment and Plan:   1. Essential hypertension  - Continue medication, monitor blood pressure at home.  - Continue DASH diet.  Reminder to go to the ER if any CP,  SOB, nausea, dizziness, severe HA, changes vision/speech.   - CBC with Differential/Platelet - COMPLETE METABOLIC PANEL WITH GFR - Magnesium - TSH  2. Hyperlipidemia, mixed  - Continue diet/meds, exercise,& lifestyle modifications.  - Continue monitor periodic cholesterol/liver & renal functions    - Lipid panel - TSH  3. Abnormal glucose  - Continue diet, exercise  - Lifestyle modifications.  - Monitor appropriate labs                                                                                                                            - Hemoglobin A1c - Insulin, random  4. Vitamin D deficiency  - Continue supplementation   - VITAMIN D 25 Hydroxy   5. Medication management  - CBC with Differential/Platelet - COMPLETE METABOLIC PANEL WITH GFR - Magnesium - Lipid panel - TSH - Hemoglobin A1c - Insulin, random - VITAMIN D 25 Hydroxy   Labs were done 1 month ago when  patient had to leave before being seen.                                                                                                                                                                                                Discussed  regular exercise, BP monitoring, weight control to achieve/maintain BMI less than 25 and discussed med and SE's. Recommended labs to assess /monitor clinical status .  I discussed the assessment and treatment plan with the patient. The  patient was provided an opportunity to ask questions and all were answered. The patient agreed with the plan and demonstrated an understanding of the instructions.  I provided over 30 minutes of exam, counseling, chart review and  complex critical decision making.        The patient was advised to call back or seek an in-person evaluation  if the symptoms worsen or if the condition fails to improve as anticipated.   Marinus Maw, MD

## 2023-08-22 ENCOUNTER — Ambulatory Visit (INDEPENDENT_AMBULATORY_CARE_PROVIDER_SITE_OTHER): Payer: BC Managed Care – PPO | Admitting: Internal Medicine

## 2023-08-22 ENCOUNTER — Ambulatory Visit: Payer: BC Managed Care – PPO | Admitting: Internal Medicine

## 2023-08-22 ENCOUNTER — Encounter: Payer: Self-pay | Admitting: Internal Medicine

## 2023-08-22 VITALS — BP 130/80 | HR 86 | Temp 98.0°F | Resp 17 | Ht 70.5 in | Wt 208.2 lb

## 2023-08-22 DIAGNOSIS — I10 Essential (primary) hypertension: Secondary | ICD-10-CM | POA: Diagnosis not present

## 2023-08-22 DIAGNOSIS — E559 Vitamin D deficiency, unspecified: Secondary | ICD-10-CM | POA: Diagnosis not present

## 2023-08-22 DIAGNOSIS — E782 Mixed hyperlipidemia: Secondary | ICD-10-CM | POA: Diagnosis not present

## 2023-08-22 DIAGNOSIS — R7309 Other abnormal glucose: Secondary | ICD-10-CM

## 2023-08-22 DIAGNOSIS — G4733 Obstructive sleep apnea (adult) (pediatric): Secondary | ICD-10-CM

## 2023-08-22 DIAGNOSIS — Z79899 Other long term (current) drug therapy: Secondary | ICD-10-CM

## 2023-08-22 NOTE — Patient Instructions (Signed)
Due to recent changes in healthcare laws, you may see the results of your imaging and laboratory studies on MyChart before your provider has had a chance to review them.  We understand that in some cases there may be results that are confusing or concerning to you. Not all laboratory results come back in the same time frame and the provider may be waiting for multiple results in order to interpret others.  Please give [place] 48 hours in order for your provider to thoroughly review all the results before contacting the office for clarification of your results.   ++++++++++++++++++++++++++++++++++++++  Vit D  & Vit C 1,000 mg   are recommended to help protect  against the Covid-19 and other Corona viruses.    Also it's recommended  to take  Zinc 50 mg  to help  protect against the Covid-19   and best place to get  is also on [email or web address]  and don't pay more than 6-8 cents /pill !  =============================== Coronavirus (COVID-19) Are you at risk?  Are you at risk for the Coronavirus (COVID-19)?  To be considered HIGH RISK for Coronavirus (COVID-19), you have to meet the following criteria:  Traveled to [place], [place], [place], [place] or [place]; or in the [place] to [place], [place], [place]  or [place]; and have fever, cough, and shortness of breath within the last 2 weeks of travel OR Been in close contact with a person diagnosed with COVID-19 within the last 2 weeks and have  fever, cough,and shortness of breath  IF YOU DO NOT MEET THESE CRITERIA, YOU ARE CONSIDERED LOW RISK FOR COVID-19.  What to do if you are HIGH RISK for COVID-19?  If you are having a medical emergency, call 911. Seek medical care right away. Before you go to a doctor's office, urgent care or emergency department,  call ahead and tell them about your recent travel, contact with someone diagnosed with COVID-19   and your symptoms.  You should receive instructions from your physician's office  regarding next steps of care.  When you arrive at healthcare provider, tell the healthcare staff immediately you have returned from  visiting [place], [place], [place], [place] or [place]; or traveled in the [place] to [place], [place],  [place] or [place] in the last two weeks or you have been in close contact with a person diagnosed with  COVID-19 in the last 2 weeks.   Tell the health care staff about your symptoms: fever, cough and shortness of breath. After you have been seen by a medical provider, you will be either: Tested for (COVID-19) and discharged home on quarantine except to seek medical care if  symptoms worsen, and asked to  Stay home and avoid contact with others until you get your results (4-5 days)  Avoid travel on public transportation if possible (such as bus, train, or airplane) or Sent to the Emergency Department by EMS for evaluation, COVID-19 testing  and  possible admission depending on your condition and test results.  What to do if you are LOW RISK for COVID-19?  Reduce your risk of any infection by using the same precautions used for avoiding the common cold or flu:  Wash your hands often with soap and warm water for at least 20 seconds.  If soap and water are not readily available,  use an alcohol-based hand sanitizer with at least 60% alcohol.  If coughing or sneezing, cover your mouth and nose by coughing  or sneezing into the elbow areas of your shirt or coat,  into a tissue or into your sleeve (not your hands). Avoid shaking hands with others and consider head nods or verbal greetings only. Avoid touching your eyes, nose, or mouth with unwashed hands.  Avoid close contact with people who are sick. Avoid places or events with large numbers of people in one location, like concerts or sporting events. Carefully consider travel plans you have or are making. If you are planning any travel outside or inside the [place], visit the CDC's Travelers' Health  webpage for the latest health notices. If you have some symptoms but not all symptoms, continue to monitor at home and seek medical attention  if your symptoms worsen. If you are having a medical emergency, call 911. >>>>>>>>>>>>>>>>>>>>>>>>>>>> Preventive Care for Adults  A healthy lifestyle and preventive care can promote health and wellness. Preventive health guidelines for men include the following key practices: A routine yearly physical is a good way to check with your health care provider about your health and preventative screening. It is a chance to share any concerns and updates on your health and to receive a thorough exam. Visit your dentist for a routine exam and preventative care every 6 months. Brush your teeth twice a day and floss once a day. Good oral hygiene prevents tooth decay and gum disease. The frequency of eye exams is based on your age, health, family medical history, use of contact lenses, and other factors. Follow your health care provider's recommendations for frequency of eye exams. Eat a healthy diet. Foods such as vegetables, fruits, whole grains, low-fat dairy products, and lean protein foods contain the nutrients you need without too many calories. Decrease your intake of foods high in solid fats, added sugars, and salt. Eat the right amount of calories for you. Get information about a proper diet from your health care provider, if necessary. Regular physical exercise is one of the most important things you can do for your health. Most adults should get at least 150 minutes of moderate-intensity exercise (any activity that increases your heart rate and causes you to sweat) each week. In addition, most adults need muscle-strengthening exercises on 2 or more days a week. Maintain a healthy weight. The body mass index (BMI) is a screening tool to identify possible weight problems. It provides an estimate of body fat based on height and weight. Your health care provider can  find your BMI and can help you achieve or maintain a healthy weight. For adults 20 years and older: A BMI below 18.5 is considered underweight. A BMI of 18.5 to 24.9 is normal. A BMI of 25 to 29.9 is considered overweight. A BMI of 30 and above is considered obese. Maintain normal blood lipids and cholesterol levels by exercising and minimizing your intake of saturated fat. Eat a balanced diet with plenty of fruit and vegetables. Blood tests for lipids and cholesterol should begin at age 38 and be repeated every 5 years. If your lipid or cholesterol levels are high, you are over 50, or you are at high risk for heart disease, you may need your cholesterol levels checked more frequently. Ongoing high lipid and cholesterol levels should be treated with medicines if diet and exercise are not working. If you smoke, find out from your health care provider how to quit. If you do not use tobacco, do not start. Lung cancer screening is recommended for adults aged 55-80 years who are at high risk for  developing lung cancer because of a history of smoking. A yearly low-dose CT scan of the lungs is recommended for people who have at least a 30-pack-year history of smoking and are a current smoker or have quit within the past 15 years. A pack year of smoking is smoking an average of 1 pack of cigarettes a day for 1 year (for example: 1 pack a day for 30 years or 2 packs a day for 15 years). Yearly screening should continue until the smoker has stopped smoking for at least 15 years. Yearly screening should be stopped for people who develop a health problem that would prevent them from having lung cancer treatment. If you choose to drink alcohol, do not have more than 2 drinks per day. One drink is considered to be 12 ounces (355 mL) of beer, 5 ounces (148 mL) of wine, or 1.5 ounces (44 mL) of liquor. Avoid use of street drugs. Do not share needles with anyone. Ask for help if you need support or instructions about  stopping the use of drugs. High blood pressure causes heart disease and increases the risk of stroke. Your blood pressure should be checked at least every 1-2 years. Ongoing high blood pressure should be treated with medicines, if weight loss and exercise are not effective. If you are 67-29 years old, ask your health care provider if you should take aspirin to prevent heart disease. Diabetes screening involves taking a blood sample to check your fasting blood sugar level. This should be done once every 3 years, after age 24, if you are within normal weight and without risk factors for diabetes. Testing should be considered at a younger age or be carried out more frequently if you are overweight and have at least 1 risk factor for diabetes. Colorectal cancer can be detected and often prevented. Most routine colorectal cancer screening begins at the age of 26 and continues through age 24. However, your health care provider may recommend screening at an earlier age if you have risk factors for colon cancer. On a yearly basis, your health care provider may provide home test kits to check for hidden blood in the stool. Use of a small camera at the end of a tube to directly examine the colon (sigmoidoscopy or colonoscopy) can detect the earliest forms of colorectal cancer. Talk to your health care provider about this at age 36, when routine screening begins. Direct exam of the colon should be repeated every 5-10 years through age 70, unless early forms of precancerous polyps or small growths are found.  Talk with your health care provider about prostate cancer screening. Testicular cancer screening isrecommended for adult males. Screening includes self-exam, a health care provider exam, and other screening tests. Consult with your health care provider about any symptoms you have or any concerns you have about testicular cancer. Use sunscreen. Apply sunscreen liberally and repeatedly throughout the day. You should  seek shade when your shadow is shorter than you. Protect yourself by wearing long sleeves, pants, a wide-brimmed hat, and sunglasses year round, whenever you are outdoors. Once a month, do a whole-body skin exam, using a mirror to look at the skin on your back. Tell your health care provider about new moles, moles that have irregular borders, moles that are larger than a pencil eraser, or moles that have changed in shape or color. Stay current with required vaccines (immunizations). Influenza vaccine. All adults should be immunized every year. Tetanus, diphtheria, and acellular pertussis (Td, Tdap) vaccine. An  adult who has not previously received Tdap or who does not know his vaccine status should receive 1 dose of Tdap. This initial dose should be followed by tetanus and diphtheria toxoids (Td) booster doses every 10 years. Adults with an unknown or incomplete history of completing a 3-dose immunization series with Td-containing vaccines should begin or complete a primary immunization series including a Tdap dose. Adults should receive a Td booster every 10 years. Varicella vaccine. An adult without evidence of immunity to varicella should receive 2 doses or a second dose if he has previously received 1 dose. Human papillomavirus (HPV) vaccine. Males aged 13-21 years who have not received the vaccine previously should receive the 3-dose series. Males aged 22-26 years may be immunized. Immunization is recommended through the age of 39 years for any male who has sex with males and did not get any or all doses earlier. Immunization is recommended for any person with an immunocompromised condition through the age of 26 years if he did not get any or all doses earlier. During the 3-dose series, the second dose should be obtained 4-8 weeks after the first dose. The third dose should be obtained 24 weeks after the first dose and 16 weeks after the second dose. Zoster vaccine. One dose is recommended for adults  aged 48 years or older unless certain conditions are present.  PREVNAR  - Pneumococcal 13-valent conjugate (PCV13) vaccine. When indicated, a person who is uncertain of his immunization history and has no record of immunization should receive the PCV13 vaccine. An adult aged 3 years or older who has certain medical conditions and has not been previously immunized should receive 1 dose of PCV13 vaccine. This PCV13 should be followed with a dose of pneumococcal polysaccharide (PPSV23) vaccine. The PPSV23 vaccine dose should be obtained at least 1 r more year(s) after the dose of PCV13 vaccine. An adult aged 60 years or older who has certain medical conditions and previously received 1 or more doses of PPSV23 vaccine should receive 1 dose of PCV13. The PCV13 vaccine dose should be obtained 1 or more years after the last PPSV23 vaccine dose.  PNEUMOVAX - Pneumococcal polysaccharide (PPSV23) vaccine. When PCV13 is also indicated, PCV13 should be obtained first. All adults aged 72 years and older should be immunized. An adult younger than age 20 years who has certain medical conditions should be immunized. Any person who resides in a nursing home or long-term care facility should be immunized. An adult smoker should be immunized. People with an immunocompromised condition and certain other conditions should receive both PCV13 and PPSV23 vaccines. People with human immunodeficiency virus (HIV) infection should be immunized as soon as possible after diagnosis. Immunization during chemotherapy or radiation therapy should be avoided. Routine use of PPSV23 vaccine is not recommended for American Indians, [address], or people younger than 65 years unless there are medical conditions that require PPSV23 vaccine. When indicated, people who have unknown immunization and have no record of immunization should receive PPSV23 vaccine. One-time revaccination 5 years after the first dose of PPSV23 is recommended for people  aged 19-64 years who have chronic kidney failure, nephrotic syndrome, asplenia, or immunocompromised conditions. People who received 1-2 doses of PPSV23 before age 42 years should receive another dose of PPSV23 vaccine at age 44 years or later if at least 5 years have passed since the previous dose. Doses of PPSV23 are not needed for people immunized with PPSV23 at or after age 22 years.  Hepatitis A vaccine.  Adults who wish to be protected from this disease, have certain high-risk conditions, work with hepatitis A-infected animals, work in hepatitis A research labs, or travel to or work in countries with a high rate of hepatitis A should be immunized. Adults who were previously unvaccinated and who anticipate close contact with an international adoptee during the first 60 days after arrival in the [place] from a country with a high rate of hepatitis A should be immunized.  Hepatitis B vaccine. Adults should be immunized if they wish to be protected from this disease, have certain high-risk conditions, may be exposed to blood or other infectious body fluids, are household contacts or sex partners of hepatitis B positive people, are clients or workers in certain care facilities, or travel to or work in countries with a high rate of hepatitis B.  Preventive Service / Frequency  Ages 20 to 92 Blood pressure check. Lipid and cholesterol check Lung cancer screening. / Every year if you are aged 55-80 years and have a 30-pack-year history of smoking and currently smoke or have quit within the past 15 years. Yearly screening is stopped once you have quit smoking for at least 15 years or develop a health problem that would prevent you from having lung cancer treatment. Fecal occult blood test (FOBT) of stool. / Every year beginning at age 57 and continuing until age 77. You may not have to do this test if you get a colonoscopy every 10 years. Flexible sigmoidoscopy** or colonoscopy.** / Every 5 years for  a flexible sigmoidoscopy or every 10 years for a colonoscopy beginning at age 31 and continuing until age 40. Screening for abdominal aortic aneurysm (AAA)  by ultrasound is recommended for people who have history of high blood pressure or who are current or former smokers. +++++++++++ Recommend Adult Low Dose Aspirin or  coated  Aspirin 81 mg daily  To reduce risk of Colon Cancer 40 %,  Skin Cancer 26 % ,  Malignant Melanoma 46%  and  Pancreatic cancer 60% ++++++++++++++++++++ Vitamin D goal  is between 70-100.  Please make sure that you are taking your Vitamin D as directed.  It is very important as a natural anti-inflammatory  helping hair, skin, and nails, as well as reducing stroke and heart attack risk.  It helps your bones and helps with mood. It also decreases numerous cancer risks so please take it as directed.  Low Vit D is associated with a 200-300% higher risk for CANCER  and 200-300% higher risk for HEART   ATTACK  &  STROKE.   .....................................Marland Kitchen It is also associated with higher death rate at younger ages,  autoimmune diseases like Rheumatoid arthritis, Lupus, Multiple Sclerosis.    Also many other serious conditions, like depression, Alzheimer's Dementia, infertility, muscle aches, fatigue, fibromyalgia - just to name a few. +++++++++++++++++++++ Recommend the book "The END of DIETING" by Dr Monico Hoar  & the book "The END of DIABETES " by Dr Monico Hoar At [email or web address] - get book & Audio CD's    Being diabetic has a  300% increased risk for heart attack, stroke, cancer, and alzheimer- type vascular dementia. It is very important that you work harder with diet by avoiding all foods that are white. Avoid white rice (brown & wild rice is OK), white potatoes (sweetpotatoes in moderation is OK), White bread or wheat bread or anything made out of white flour like bagels, donuts, rolls, buns, biscuits, cakes, pastries, cookies, pizza crust, and pasta (made  from white flour & egg whites) - vegetarian pasta or spinach or wheat pasta is OK. Multigrain breads like Arnold's or [employer], or multigrain sandwich thins or flatbreads.  Diet, exercise and weight loss can reverse and cure diabetes in the early stages.  Diet, exercise and weight loss is very important in the control and prevention of complications of diabetes which affects every system in your body, ie. Brain - dementia/stroke, eyes - glaucoma/blindness, heart - heart attack/heart failure, kidneys - dialysis, stomach - gastric paralysis, intestines - malabsorption, nerves - severe painful neuritis, circulation - gangrene & loss of a leg(s), and finally cancer and Alzheimers.    I recommend avoid fried & greasy foods,  sweets/candy, white rice (brown or wild rice or Quinoa is OK), white potatoes (sweet potatoes are OK) - anything made from white flour - bagels, doughnuts, rolls, buns, biscuits,white and wheat breads, pizza crust and traditional pasta made of white flour & egg white(vegetarian pasta or spinach or wheat pasta is OK).  Multi-grain bread is OK - like multi-grain flat bread or sandwich thins. Avoid alcohol in excess. Exercise is also important.    Eat all the vegetables you want - avoid meat, especially red meat and dairy - especially cheese.  Cheese is the most concentrated form of trans-fats which is the worst thing to clog up our arteries. Veggie cheese is OK which can be found in the fresh produce section at [employer] or [employer] or [employer]  ++++++++++++++++++++++ DASH Eating Plan  DASH stands for "Dietary Approaches to Stop Hypertension."   The DASH eating plan is a healthy eating plan that has been shown to reduce high blood pressure (hypertension). Additional health benefits may include reducing the risk of type 2 diabetes mellitus, heart disease, and stroke. The DASH eating plan may also help with weight loss. WHAT DO I NEED TO KNOW ABOUT THE DASH EATING PLAN? For  the DASH eating plan, you will follow these general guidelines: Choose foods with a percent daily value for sodium of less than 5% (as listed on the food label). Use salt-free seasonings or herbs instead of table salt or sea salt. Check with your health care provider or pharmacist before using salt substitutes. Eat lower-sodium products, often labeled as "lower sodium" or "no salt added." Eat fresh foods. Eat more vegetables, fruits, and low-fat dairy products. Choose whole grains. Look for the word "whole" as the first word in the ingredient list. Choose fish  Limit sweets, desserts, sugars, and sugary drinks. Choose heart-healthy fats. Eat veggie cheese  Eat more home-cooked food and less restaurant, buffet, and fast food. Limit fried foods. Cook foods using methods other than frying. Limit canned vegetables. If you do use them, rinse them well to decrease the sodium. When eating at a restaurant, ask that your food be prepared with less salt, or no salt if possible.                      WHAT FOODS CAN I EAT? Read Dr Francis Dowse Fuhrman's books on The End of Dieting & The End of Diabetes  Grains Whole grain or whole wheat bread. Brown rice. Whole grain or whole wheat pasta. Quinoa, bulgur, and whole grain cereals. Low-sodium cereals. Corn or whole wheat flour tortillas. Whole grain cornbread. Whole grain crackers. Low-sodium crackers.  Vegetables Fresh or frozen vegetables (raw, steamed, roasted, or grilled). Low-sodium or reduced-sodium tomato and vegetable juices. Low-sodium or reduced-sodium tomato sauce and paste. Low-sodium or reduced-sodium canned vegetables.  Fruits All fresh, canned (in natural juice), or frozen fruits.  Protein Products  All fish and seafood.  Dried beans, peas, or lentils. Unsalted nuts and seeds. Unsalted canned beans.  Dairy Low-fat dairy products, such as skim or 1% milk, 2% or reduced-fat cheeses, low-fat ricotta or cottage cheese, or plain low-fat yogurt.  Low-sodium or reduced-sodium cheeses.  Fats and Oils Tub margarines without trans fats. Light or reduced-fat mayonnaise and salad dressings (reduced sodium). Avocado. Safflower, olive, or canola oils. Natural peanut or almond butter.  Other Unsalted popcorn and pretzels. The items listed above may not be a complete list of recommended foods or beverages. Contact your dietitian for more options.  +++++++++++++++++++  WHAT FOODS ARE NOT RECOMMENDED? Grains/ White flour or wheat flour White bread. White pasta. White rice. Refined cornbread. Bagels and croissants. Crackers that contain trans fat.  Vegetables  Creamed or fried vegetables. Vegetables in a . Regular canned vegetables. Regular canned tomato sauce and paste. Regular tomato and vegetable juices.  Fruits Dried fruits. Canned fruit in light or heavy syrup. Fruit juice.  Meat and Other Protein Products Meat in general - RED meat & White meat.  Fatty cuts of meat. Ribs, chicken wings, all processed meats as bacon, sausage, bologna, salami, fatback, hot dogs, bratwurst and packaged luncheon meats.  Dairy Whole or 2% milk, cream, half-and-half, and cream cheese. Whole-fat or sweetened yogurt. Full-fat cheeses or blue cheese. Non-dairy creamers and whipped toppings. Processed cheese, cheese spreads, or cheese curds.  Condiments Onion and garlic salt, seasoned salt, table salt, and sea salt. Canned and packaged gravies. Worcestershire sauce. Tartar sauce. Barbecue sauce. Teriyaki sauce. Soy sauce, including reduced sodium. Steak sauce. Fish sauce. Oyster sauce. Cocktail sauce. Horseradish. Ketchup and mustard. Meat flavorings and tenderizers. Bouillon cubes. Hot sauce. Tabasco sauce. Marinades. Taco seasonings. Relishes.  Fats and Oils Butter, stick margarine, lard, shortening and bacon fat. Coconut, palm kernel, or palm oils. Regular salad dressings.  Pickles and olives. Salted popcorn and pretzels.  The items listed above may not  be a complete list of foods and beverages to avoid.

## 2023-09-13 DIAGNOSIS — G4733 Obstructive sleep apnea (adult) (pediatric): Secondary | ICD-10-CM | POA: Diagnosis not present

## 2023-09-25 ENCOUNTER — Other Ambulatory Visit: Payer: Self-pay | Admitting: Internal Medicine

## 2023-09-25 ENCOUNTER — Other Ambulatory Visit: Payer: Self-pay | Admitting: Nurse Practitioner

## 2023-09-25 DIAGNOSIS — I10 Essential (primary) hypertension: Secondary | ICD-10-CM

## 2023-09-25 DIAGNOSIS — G2581 Restless legs syndrome: Secondary | ICD-10-CM

## 2023-10-14 ENCOUNTER — Ambulatory Visit: Payer: BC Managed Care – PPO | Admitting: Nurse Practitioner

## 2023-10-14 DIAGNOSIS — G4733 Obstructive sleep apnea (adult) (pediatric): Secondary | ICD-10-CM | POA: Diagnosis not present

## 2023-10-17 ENCOUNTER — Other Ambulatory Visit: Payer: Self-pay | Admitting: Nurse Practitioner

## 2023-10-17 DIAGNOSIS — K219 Gastro-esophageal reflux disease without esophagitis: Secondary | ICD-10-CM

## 2023-10-17 DIAGNOSIS — F419 Anxiety disorder, unspecified: Secondary | ICD-10-CM

## 2023-11-12 ENCOUNTER — Other Ambulatory Visit: Payer: Self-pay

## 2023-11-12 DIAGNOSIS — F419 Anxiety disorder, unspecified: Secondary | ICD-10-CM

## 2023-11-12 MED ORDER — BUSPIRONE HCL 7.5 MG PO TABS: 7.5000 mg | ORAL_TABLET | Freq: Three times a day (TID) | ORAL | 0 refills | Status: DC

## 2023-11-14 DIAGNOSIS — G4733 Obstructive sleep apnea (adult) (pediatric): Secondary | ICD-10-CM | POA: Diagnosis not present

## 2023-11-18 DIAGNOSIS — G4733 Obstructive sleep apnea (adult) (pediatric): Secondary | ICD-10-CM | POA: Diagnosis not present

## 2023-11-20 ENCOUNTER — Other Ambulatory Visit: Payer: Self-pay

## 2023-11-20 DIAGNOSIS — I10 Essential (primary) hypertension: Secondary | ICD-10-CM

## 2023-11-20 MED ORDER — HYDROCHLOROTHIAZIDE 25 MG PO TABS: ORAL_TABLET | ORAL | 0 refills | Status: AC

## 2023-11-25 ENCOUNTER — Other Ambulatory Visit: Payer: Self-pay

## 2023-11-25 DIAGNOSIS — G2581 Restless legs syndrome: Secondary | ICD-10-CM

## 2023-11-25 MED ORDER — ROPINIROLE HCL 2 MG PO TABS: ORAL_TABLET | ORAL | 0 refills | Status: AC

## 2023-12-10 ENCOUNTER — Other Ambulatory Visit: Payer: Self-pay | Admitting: Family

## 2023-12-10 DIAGNOSIS — F419 Anxiety disorder, unspecified: Secondary | ICD-10-CM

## 2023-12-12 DIAGNOSIS — G4733 Obstructive sleep apnea (adult) (pediatric): Secondary | ICD-10-CM | POA: Diagnosis not present

## 2023-12-17 ENCOUNTER — Other Ambulatory Visit: Payer: Self-pay

## 2023-12-17 DIAGNOSIS — F419 Anxiety disorder, unspecified: Secondary | ICD-10-CM

## 2023-12-17 MED ORDER — BUSPIRONE HCL 7.5 MG PO TABS: 7.5000 mg | ORAL_TABLET | Freq: Three times a day (TID) | ORAL | 0 refills | Status: AC

## 2023-12-22 DIAGNOSIS — Z7689 Persons encountering health services in other specified circumstances: Secondary | ICD-10-CM | POA: Diagnosis not present

## 2023-12-22 DIAGNOSIS — I10 Essential (primary) hypertension: Secondary | ICD-10-CM | POA: Diagnosis not present

## 2023-12-22 DIAGNOSIS — E785 Hyperlipidemia, unspecified: Secondary | ICD-10-CM | POA: Diagnosis not present

## 2023-12-22 DIAGNOSIS — G4733 Obstructive sleep apnea (adult) (pediatric): Secondary | ICD-10-CM | POA: Diagnosis not present

## 2023-12-23 DIAGNOSIS — S29012A Strain of muscle and tendon of back wall of thorax, initial encounter: Secondary | ICD-10-CM | POA: Diagnosis not present

## 2023-12-23 DIAGNOSIS — M7711 Lateral epicondylitis, right elbow: Secondary | ICD-10-CM | POA: Diagnosis not present

## 2023-12-24 DIAGNOSIS — E785 Hyperlipidemia, unspecified: Secondary | ICD-10-CM | POA: Diagnosis not present

## 2023-12-24 DIAGNOSIS — R5382 Chronic fatigue, unspecified: Secondary | ICD-10-CM | POA: Diagnosis not present

## 2023-12-24 DIAGNOSIS — E559 Vitamin D deficiency, unspecified: Secondary | ICD-10-CM | POA: Diagnosis not present

## 2023-12-24 DIAGNOSIS — R739 Hyperglycemia, unspecified: Secondary | ICD-10-CM | POA: Diagnosis not present

## 2023-12-24 DIAGNOSIS — R7989 Other specified abnormal findings of blood chemistry: Secondary | ICD-10-CM | POA: Diagnosis not present

## 2023-12-24 DIAGNOSIS — I10 Essential (primary) hypertension: Secondary | ICD-10-CM | POA: Diagnosis not present

## 2023-12-31 ENCOUNTER — Encounter: Payer: BC Managed Care – PPO | Admitting: Internal Medicine

## 2024-01-13 ENCOUNTER — Encounter: Payer: BC Managed Care – PPO | Admitting: Internal Medicine

## 2024-01-13 DIAGNOSIS — R7989 Other specified abnormal findings of blood chemistry: Secondary | ICD-10-CM | POA: Diagnosis not present

## 2024-01-13 DIAGNOSIS — D72829 Elevated white blood cell count, unspecified: Secondary | ICD-10-CM | POA: Diagnosis not present

## 2024-02-11 DIAGNOSIS — G4733 Obstructive sleep apnea (adult) (pediatric): Secondary | ICD-10-CM | POA: Diagnosis not present

## 2024-02-15 ENCOUNTER — Other Ambulatory Visit: Payer: Self-pay | Admitting: Family

## 2024-02-15 DIAGNOSIS — I10 Essential (primary) hypertension: Secondary | ICD-10-CM

## 2024-05-03 ENCOUNTER — Other Ambulatory Visit: Payer: Self-pay | Admitting: Family

## 2024-05-03 DIAGNOSIS — F419 Anxiety disorder, unspecified: Secondary | ICD-10-CM

## 2024-06-03 ENCOUNTER — Other Ambulatory Visit: Payer: Self-pay | Admitting: Nurse Practitioner

## 2024-06-03 DIAGNOSIS — E782 Mixed hyperlipidemia: Secondary | ICD-10-CM
